# Patient Record
Sex: Male | Born: 1935 | Race: Black or African American | Hispanic: No | Marital: Married | Smoking: Never smoker
Health system: Southern US, Community
[De-identification: ages and names within clinical notes are randomized; demographics above are authoritative.]

## PROBLEM LIST (undated history)

## (undated) DIAGNOSIS — E7800 Pure hypercholesterolemia, unspecified: Secondary | ICD-10-CM

## (undated) DIAGNOSIS — I10 Essential (primary) hypertension: Secondary | ICD-10-CM

## (undated) DIAGNOSIS — K219 Gastro-esophageal reflux disease without esophagitis: Secondary | ICD-10-CM

## (undated) DIAGNOSIS — I499 Cardiac arrhythmia, unspecified: Secondary | ICD-10-CM

## (undated) DIAGNOSIS — N429 Disorder of prostate, unspecified: Secondary | ICD-10-CM

## (undated) DIAGNOSIS — M1990 Unspecified osteoarthritis, unspecified site: Secondary | ICD-10-CM

## (undated) DIAGNOSIS — Z87442 Personal history of urinary calculi: Secondary | ICD-10-CM

## (undated) DIAGNOSIS — H9190 Unspecified hearing loss, unspecified ear: Secondary | ICD-10-CM

## (undated) HISTORY — PX: EYE SURGERY: SHX253

---

## 2004-03-23 ENCOUNTER — Emergency Department: Payer: Self-pay | Admitting: General Practice

## 2007-03-30 ENCOUNTER — Ambulatory Visit: Payer: Self-pay | Admitting: Gastroenterology

## 2008-03-01 ENCOUNTER — Inpatient Hospital Stay: Payer: Self-pay | Admitting: Internal Medicine

## 2008-03-01 ENCOUNTER — Other Ambulatory Visit: Payer: Self-pay

## 2008-07-29 ENCOUNTER — Emergency Department: Payer: Self-pay | Admitting: Emergency Medicine

## 2008-08-22 ENCOUNTER — Ambulatory Visit: Payer: Self-pay | Admitting: Family Medicine

## 2008-10-07 ENCOUNTER — Ambulatory Visit: Payer: Self-pay | Admitting: Family Medicine

## 2008-10-15 ENCOUNTER — Ambulatory Visit: Payer: Self-pay | Admitting: Family Medicine

## 2009-01-28 ENCOUNTER — Ambulatory Visit: Payer: Self-pay | Admitting: Specialist

## 2011-11-01 ENCOUNTER — Emergency Department: Payer: Self-pay | Admitting: Emergency Medicine

## 2011-11-01 LAB — URINALYSIS, COMPLETE
Bacteria: NONE SEEN
Bilirubin,UR: NEGATIVE
Glucose,UR: NEGATIVE mg/dL (ref 0–75)
Ketone: NEGATIVE
Ph: 6 (ref 4.5–8.0)
Protein: NEGATIVE
Specific Gravity: 1.013 (ref 1.003–1.030)
WBC UR: <1 /HPF (ref 0–5)

## 2011-11-01 LAB — COMPREHENSIVE METABOLIC PANEL
BUN: 22 mg/dL — ABNORMAL HIGH (ref 7–18)
Bilirubin,Total: 0.7 mg/dL (ref 0.2–1.0)
Calcium, Total: 8.2 mg/dL — ABNORMAL LOW (ref 8.5–10.1)
Chloride: 101 mmol/L (ref 98–107)
EGFR (African American): >60
EGFR (Non-African Amer.): >60
SGPT (ALT): 14 U/L
Total Protein: 7 g/dL (ref 6.4–8.2)

## 2011-11-01 LAB — CBC
MCV: 91 fL (ref 80–100)
Platelet: 198 10*3/uL (ref 150–440)
RBC: 4.08 10*6/uL — ABNORMAL LOW (ref 4.40–5.90)
RDW: 13.5 % (ref 11.5–14.5)
WBC: 9.9 10*3/uL (ref 3.8–10.6)

## 2011-11-01 LAB — TROPONIN I: Troponin-I: 0.03 ng/mL

## 2011-11-01 LAB — CK TOTAL AND CKMB (NOT AT ARMC): CK-MB: 0.6 ng/mL (ref 0.5–3.6)

## 2011-11-07 LAB — CULTURE, BLOOD (SINGLE)

## 2012-05-22 DIAGNOSIS — Z8546 Personal history of malignant neoplasm of prostate: Secondary | ICD-10-CM | POA: Insufficient documentation

## 2012-05-22 DIAGNOSIS — R350 Frequency of micturition: Secondary | ICD-10-CM | POA: Insufficient documentation

## 2012-05-22 DIAGNOSIS — R351 Nocturia: Secondary | ICD-10-CM | POA: Insufficient documentation

## 2012-05-24 HISTORY — PX: HERNIA REPAIR: SHX51

## 2013-02-14 DIAGNOSIS — I10 Essential (primary) hypertension: Secondary | ICD-10-CM | POA: Insufficient documentation

## 2013-09-10 ENCOUNTER — Emergency Department: Payer: Self-pay | Admitting: Emergency Medicine

## 2013-11-06 ENCOUNTER — Ambulatory Visit: Payer: Self-pay | Admitting: Surgery

## 2013-11-06 LAB — CBC WITH DIFFERENTIAL/PLATELET
BASOS PCT: 0.6 %
Basophil #: 0 10*3/uL (ref 0.0–0.1)
EOS ABS: 0.1 10*3/uL (ref 0.0–0.7)
Eosinophil %: 3.1 %
HCT: 36 % — AB (ref 40.0–52.0)
HGB: 12 g/dL — ABNORMAL LOW (ref 13.0–18.0)
LYMPHS PCT: 26.5 %
Lymphocyte #: 1.2 10*3/uL (ref 1.0–3.6)
MCH: 30.8 pg (ref 26.0–34.0)
MCHC: 33.3 g/dL (ref 32.0–36.0)
MCV: 93 fL (ref 80–100)
Monocyte #: 0.5 x10 3/mm (ref 0.2–1.0)
Monocyte %: 11.5 %
NEUTROS ABS: 2.7 10*3/uL (ref 1.4–6.5)
NEUTROS PCT: 58.3 %
Platelet: 215 10*3/uL (ref 150–440)
RBC: 3.89 10*6/uL — ABNORMAL LOW (ref 4.40–5.90)
RDW: 14 % (ref 11.5–14.5)
WBC: 4.6 10*3/uL (ref 3.8–10.6)

## 2013-11-06 LAB — BASIC METABOLIC PANEL
ANION GAP: 4 — AB (ref 7–16)
BUN: 28 mg/dL — AB (ref 7–18)
CALCIUM: 8.5 mg/dL (ref 8.5–10.1)
CO2: 29 mmol/L (ref 21–32)
CREATININE: 0.78 mg/dL (ref 0.60–1.30)
Chloride: 104 mmol/L (ref 98–107)
EGFR (African American): >60
Glucose: 92 mg/dL (ref 65–99)
Osmolality: 279 (ref 275–301)
Potassium: 4.1 mmol/L (ref 3.5–5.1)
SODIUM: 137 mmol/L (ref 136–145)

## 2013-11-08 ENCOUNTER — Ambulatory Visit: Payer: Self-pay | Admitting: Surgery

## 2013-12-05 DIAGNOSIS — M1990 Unspecified osteoarthritis, unspecified site: Secondary | ICD-10-CM | POA: Insufficient documentation

## 2014-05-27 DIAGNOSIS — H40003 Preglaucoma, unspecified, bilateral: Secondary | ICD-10-CM | POA: Diagnosis not present

## 2014-07-29 DIAGNOSIS — J439 Emphysema, unspecified: Secondary | ICD-10-CM | POA: Diagnosis not present

## 2014-07-29 DIAGNOSIS — K219 Gastro-esophageal reflux disease without esophagitis: Secondary | ICD-10-CM | POA: Diagnosis not present

## 2014-07-29 DIAGNOSIS — E784 Other hyperlipidemia: Secondary | ICD-10-CM | POA: Diagnosis not present

## 2014-07-29 DIAGNOSIS — H6123 Impacted cerumen, bilateral: Secondary | ICD-10-CM | POA: Diagnosis not present

## 2014-07-29 DIAGNOSIS — F329 Major depressive disorder, single episode, unspecified: Secondary | ICD-10-CM | POA: Diagnosis not present

## 2014-07-29 DIAGNOSIS — I10 Essential (primary) hypertension: Secondary | ICD-10-CM | POA: Diagnosis not present

## 2014-07-29 DIAGNOSIS — H918X3 Other specified hearing loss, bilateral: Secondary | ICD-10-CM | POA: Diagnosis not present

## 2014-07-29 DIAGNOSIS — D291 Benign neoplasm of prostate: Secondary | ICD-10-CM | POA: Diagnosis not present

## 2014-08-09 DIAGNOSIS — H6120 Impacted cerumen, unspecified ear: Secondary | ICD-10-CM | POA: Diagnosis not present

## 2014-08-09 DIAGNOSIS — H903 Sensorineural hearing loss, bilateral: Secondary | ICD-10-CM | POA: Diagnosis not present

## 2014-09-14 NOTE — Op Note (Signed)
PATIENT NAME:  Eric [name], KERSTETTER MR#:  [id number] DATE OF BIRTH:  1923-11-26  DATE OF PROCEDURE:  11/08/2013  PREOPERATIVE DIAGNOSIS: Left inguinal hernia.   POSTOPERATIVE DIAGNOSIS: Left inguinal hernia.  OPERATION PERFORMED:  Robotic-assisted laparoscopic left inguinal hernia repair.  ANESTHESIA:  General.  SURGEON:  Dia Crawford, M.D.  OPERATIVE PROCEDURE:  With the patient in the supine position after placing appropriate general anesthesia, the patient's abdomen was prepped with ChloraPrep and draped with sterile towels. A small supraumbilical incision was made in the standard fashion, carried down bluntly through the subcutaneous tissue. A Veress needle was used to cannulate the peritoneal cavity. CO2 was insufflated to the appropriate pressure measurements. Approximately two and a half liters of CO2 were instilled. Veress needle was  withdrawn and an 8.5 mm robotic port was inserted into the peritoneal cavity. The abdomen was explored. No abnormalities were identified other than the large left inguinal hernia, which appeared to be an indirect defect.  There did appear to be a small direct defect on the right side. The patient, however, is not symptomatic. Two lateral ports, 8.5 mm in size, were placed under direct vision in the left upper quadrant. An 11 mm port was inserted under direct vision. The robot was brought to the table, docked, and the instruments inserted under direct vision. The peritoneum was taken down from the medial umbilical fold across the epigastric vessels laterally, exposing Cooper's ligament and the cord structures. The sac was quite big and after tedious dissection, still did not expose the end of the sac, which was deep in the scrotum. I elected to the sac was off. The sac was amputated about midportion using Bovie electrocautery. The pocket appeared to be satisfactory.  A piece of medium 3-D Bard MAX mesh was brought to the table and inserted into the preperitoneal pocket. It  was sutured in place with 3-0 Vicryl under direct vision. The peritoneum was closed over the mesh using V-Loc suture under direct vision. The repair appeared to be satisfactory. The abdomen was desufflated. All ports were removed without difficulty. All the fascial incisions were closed with 0 Vicryl. The skin was closed with 5-0 nylon. Sterile dressings were applied. The patient returned to the recovery room, having tolerated the procedure well. Sponge, instrument and needle counts were correct x 2 in the operating room.     ____________________________ Rodena Goldmann III, MD rle:dmm D: 11/08/2013 14:21:27 ET T: 11/08/2013 20:19:34 ET JOB#: [id number]  cc: Micheline Maze, MD, <Dictator> Juline Patch, MD Rodena Goldmann MD ELECTRONICALLY SIGNED 11/09/2013 18:13

## 2014-12-02 DIAGNOSIS — H34831 Tributary (branch) retinal vein occlusion, right eye: Secondary | ICD-10-CM | POA: Diagnosis not present

## 2015-03-05 DIAGNOSIS — H401132 Primary open-angle glaucoma, bilateral, moderate stage: Secondary | ICD-10-CM | POA: Diagnosis not present

## 2015-03-11 DIAGNOSIS — H401132 Primary open-angle glaucoma, bilateral, moderate stage: Secondary | ICD-10-CM | POA: Diagnosis not present

## 2015-03-25 ENCOUNTER — Other Ambulatory Visit: Payer: Self-pay | Admitting: Family Medicine

## 2015-05-26 ENCOUNTER — Other Ambulatory Visit: Payer: Self-pay | Admitting: Family Medicine

## 2015-05-27 ENCOUNTER — Other Ambulatory Visit: Payer: Self-pay

## 2015-05-27 MED ORDER — LOSARTAN POTASSIUM-HCTZ 50-12.5 MG PO TABS: 1.0000 | ORAL_TABLET | Freq: Every day | ORAL | Status: DC

## 2015-09-09 DIAGNOSIS — H401132 Primary open-angle glaucoma, bilateral, moderate stage: Secondary | ICD-10-CM | POA: Diagnosis not present

## 2015-09-16 ENCOUNTER — Emergency Department: Payer: Commercial Managed Care - HMO

## 2015-09-16 ENCOUNTER — Encounter: Payer: Self-pay | Admitting: Emergency Medicine

## 2015-09-16 ENCOUNTER — Emergency Department
Admission: EM | Admit: 2015-09-16 | Discharge: 2015-09-16 | Disposition: A | Discharge status: Home / Self Care | Payer: Commercial Managed Care - HMO | Attending: Emergency Medicine | Admitting: Emergency Medicine

## 2015-09-16 ENCOUNTER — Other Ambulatory Visit: Payer: Self-pay

## 2015-09-16 DIAGNOSIS — N200 Calculus of kidney: Secondary | ICD-10-CM | POA: Insufficient documentation

## 2015-09-16 DIAGNOSIS — R109 Unspecified abdominal pain: Secondary | ICD-10-CM

## 2015-09-16 DIAGNOSIS — R319 Hematuria, unspecified: Secondary | ICD-10-CM

## 2015-09-16 DIAGNOSIS — I10 Essential (primary) hypertension: Secondary | ICD-10-CM | POA: Diagnosis not present

## 2015-09-16 HISTORY — DX: Disorder of prostate, unspecified: N42.9

## 2015-09-16 HISTORY — DX: Essential (primary) hypertension: I10

## 2015-09-16 HISTORY — DX: Pure hypercholesterolemia, unspecified: E78.00

## 2015-09-16 LAB — URINALYSIS COMPLETE WITH MICROSCOPIC (ARMC ONLY)
BILIRUBIN URINE: NEGATIVE
Glucose, UA: NEGATIVE mg/dL
Ketones, ur: NEGATIVE mg/dL
NITRITE: NEGATIVE
PH: 6 (ref 5.0–8.0)
Protein, ur: 100 mg/dL — AB
SPECIFIC GRAVITY, URINE: 1.016 (ref 1.005–1.030)

## 2015-09-16 LAB — BASIC METABOLIC PANEL
ANION GAP: 8 (ref 5–15)
BUN: 31 mg/dL — ABNORMAL HIGH (ref 6–20)
CALCIUM: 8.5 mg/dL — AB (ref 8.9–10.3)
CO2: 28 mmol/L (ref 22–32)
Chloride: 103 mmol/L (ref 101–111)
Creatinine, Ser: 1.26 mg/dL — ABNORMAL HIGH (ref 0.61–1.24)
GFR, EST AFRICAN AMERICAN: 57 mL/min — AB (ref >60)
GFR, EST NON AFRICAN AMERICAN: 49 mL/min — AB (ref >60)
Glucose, Bld: 127 mg/dL — ABNORMAL HIGH (ref 65–99)
Potassium: 3.8 mmol/L (ref 3.5–5.1)
Sodium: 139 mmol/L (ref 135–145)

## 2015-09-16 LAB — CBC
HCT: 36 % — ABNORMAL LOW (ref 40.0–52.0)
HEMOGLOBIN: 12.2 g/dL — AB (ref 13.0–18.0)
MCH: 31 pg (ref 26.0–34.0)
MCHC: 33.8 g/dL (ref 32.0–36.0)
MCV: 91.8 fL (ref 80.0–100.0)
PLATELETS: 196 10*3/uL (ref 150–440)
RBC: 3.92 MIL/uL — AB (ref 4.40–5.90)
RDW: 13.7 % (ref 11.5–14.5)
WBC: 7.8 10*3/uL (ref 3.8–10.6)

## 2015-09-16 LAB — HEPATIC FUNCTION PANEL
ALBUMIN: 3.6 g/dL (ref 3.5–5.0)
ALT: 16 U/L — ABNORMAL LOW (ref 17–63)
AST: 19 U/L (ref 15–41)
Alkaline Phosphatase: 38 U/L (ref 38–126)
BILIRUBIN TOTAL: 0.4 mg/dL (ref 0.3–1.2)
Bilirubin, Direct: <0.1 mg/dL — ABNORMAL LOW (ref 0.1–0.5)
TOTAL PROTEIN: 6.9 g/dL (ref 6.5–8.1)

## 2015-09-16 LAB — LIPASE, BLOOD: LIPASE: 19 U/L (ref 11–51)

## 2015-09-16 MED ORDER — DEXTROSE 5 % IV SOLN
1.0000 g | Freq: Once | INTRAVENOUS | Status: AC
  Administered 2015-09-16: 1 g via INTRAVENOUS

## 2015-09-16 MED ORDER — DEXTROSE 5 % IV SOLN
INTRAVENOUS | Status: AC
  Administered 2015-09-16: 1 g via INTRAVENOUS
  Filled 2015-09-16: qty 10

## 2015-09-16 MED ORDER — SODIUM CHLORIDE 0.9 % IV BOLUS (SEPSIS)
500.0000 mL | Freq: Once | INTRAVENOUS | Status: AC
  Administered 2015-09-16: 500 mL via INTRAVENOUS

## 2015-09-16 MED ORDER — CEPHALEXIN 500 MG PO CAPS: 500.0000 mg | ORAL_CAPSULE | Freq: Four times a day (QID) | ORAL | Status: AC

## 2015-09-16 NOTE — ED Provider Notes (Addendum)
[facility] Emergency Department Provider Note  ____________________________________________   I have reviewed the triage vital signs and the nursing notes.   HISTORY  Chief Complaint Flank Pain    HPI St Eastan Kaluza is a 82 y.o. male with history of hypertension and hypercholesterol and prostate issues as well as a hernia repair were presents today complaining of what was thought initially with left flank pain which is now gone but now having some mild left lower quadrant pain. He has noted some blood in his urine. No fever no chills no nausea no vomiting no other complaints. Pain started this morning.    Past Medical History  Diagnosis Date  . Hypertension   . High cholesterol   . Prostate disorder     There are no active problems to display for this patient.   Past Surgical History  Procedure Laterality Date  . Hernia repair      Current Outpatient Rx  Name  Route  Sig  Dispense  Refill  . losartan-hydrochlorothiazide (HYZAAR) 50-12.5 MG tablet   Oral   Take 1 tablet by mouth daily.   90 tablet   1   . omeprazole (PRILOSEC) 20 MG capsule      TAKE 1 CAPSULE ONE TIME DAILY   90 capsule   0   . simvastatin (ZOCOR) 40 MG tablet      TAKE 1 TABLET AT BEDTIME   90 tablet   0   . tamsulosin (FLOMAX) 0.4 MG CAPS capsule      TAKE 1 CAPSULE ONE TIME DAILY   90 capsule   1     Allergies Review of patient's allergies indicates no known allergies.  No family history on file.  Social History Social History  Substance Use Topics  . Smoking status: Never Smoker   . Smokeless tobacco: None  . Alcohol Use: No    Review of Systems Constitutional: No fever/chills Eyes: No visual changes. ENT: No sore throat. No stiff neck no neck pain Cardiovascular: Denies chest pain. Respiratory: Denies shortness of breath. Gastrointestinal:   no vomiting.  No diarrhea.  No constipation. Genitourinary: Negative for dysuria. Musculoskeletal:  Negative lower extremity swelling Skin: Negative for rash. Neurological: Negative for headaches, focal weakness or numbness. 10-point ROS otherwise negative.  ____________________________________________   PHYSICAL EXAM:  VITAL SIGNS: ED Triage Vitals  Enc Vitals Group     BP 09/16/15 1915 168/119 mmHg     Pulse Rate 09/16/15 1915 64     Resp 09/16/15 1915 18     Temp 09/16/15 1915 98.6 F (37 C)     Temp Source 09/16/15 1915 Oral     SpO2 09/16/15 1915 99 %     Weight 09/16/15 1915 160 lb (72.576 kg)     Height 09/16/15 1915 5\' 10"  (1.778 m)     Head Cir --      Peak Flow --      Pain Score 09/16/15 1916 4     Pain Loc --      Pain Edu? --      Excl. in [place]? --     Constitutional: Alert and oriented. Well appearing and in no acute distress. Eyes: Conjunctivae are normal. PERRL. EOMI. Head: Atraumatic. Nose: No congestion/rhinnorhea. Mouth/Throat: Mucous membranes are moist.  Oropharynx non-erythematous. Neck: No stridor.   Nontender with no meningismus Cardiovascular: Normal rate, regular rhythm. Grossly normal heart sounds.  Good peripheral circulation. Respiratory: Normal respiratory effort.  No retractions. Lungs CTAB. Abdominal: Soft  and nontender. No distention. No guarding no rebound Back:  There is no focal tenderness or step off there is no midline tenderness there are no lesions noted. there is no CVA tenderness GU: Normal external noncircumcised male genitalia with no obvious lesions noted. No masses. Nontender Musculoskeletal: No lower extremity tenderness. No joint effusions, no DVT signs strong distal pulses no edema Neurologic:  Normal speech and language. No gross focal neurologic deficits are appreciated.  Skin:  Skin is warm, dry and intact. No rash noted. Psychiatric: Mood and affect are normal. Speech and behavior are normal.  ____________________________________________   LABS (all labs ordered are listed, but only abnormal results are  displayed)  Labs Reviewed  CBC - Abnormal; Notable for the following:    RBC 3.92 (*)    Hemoglobin 12.2 (*)    HCT 36.0 (*)    All other components within normal limits  BASIC METABOLIC PANEL - Abnormal; Notable for the following:    Glucose, Bld 127 (*)    BUN 31 (*)    Creatinine, Ser 1.26 (*)    Calcium 8.5 (*)    GFR calc non Af Amer 49 (*)    GFR calc Af Amer 57 (*)    All other components within normal limits  URINALYSIS COMPLETEWITH MICROSCOPIC ([facility] ONLY) - Abnormal; Notable for the following:    Color, Urine YELLOW (*)    APPearance CLOUDY (*)    Hgb urine dipstick 3+ (*)    Protein, ur 100 (*)    Leukocytes, UA 2+ (*)    Bacteria, UA RARE (*)    Squamous Epithelial / LPF 0-5 (*)    All other components within normal limits  URINE CULTURE  HEPATIC FUNCTION PANEL  LIPASE, BLOOD   ____________________________________________  EKG  I personally interpreted any EKGs ordered by me or triage  ____________________________________________  RADIOLOGY  I reviewed any imaging ordered by me or triage that were performed during my shift and, if possible, patient and/or family made aware of any abnormal findings. ____________________________________________   PROCEDURES  Procedure(s) performed: None  Critical Care performed: None  ____________________________________________   INITIAL IMPRESSION / ASSESSMENT AND PLAN / ED COURSE  Pertinent labs & imaging results that were available during my care of the patient were reviewed by me and considered in my medical decision making (see chart for details).  Patient with hematuria and a kidney stone on CT scan in the left kidney, may have already passed another kidney stone no hydronephrosis, no evidence of significant urinary retention. CBC reassuring platelet count reassuring creatinine is slightly up but not dangerously so does appear mildly dehydrated. We will give him by mouth fluids here and IV as well. Urine is  largely consistent with hematuria, this certainly could represent bladder cancer although no masses are seen on CT, could also represent urinary tract infection although again low suspicion given the significant hematuria compared to leukocytes. We will, however, obtain cultures, I will empirically treat for possible UTI since I don't see an obvious obstructing stone however, patient does have clearly a kidney stone inside the left kidney which is likely not causing any trouble but certainly indicates that he is capable of producing stones which could certainly cause all of his symptoms.  ----------------------------------------- 9:02 PM on 09/16/2015 ----------------------------------------- Patient has no pain and no complaints. We will give him Rocephin here and have him follow-up with urology. Patient is aware of the possibility as well as family are aware of the possibility that the  patient could have an obstruction from his hematuria but given the size of his prostate, I do not think it is in the patient's best interest to try to pass a Foley as he is easily making urine now with no evidence of urinary retention. Family will look out for fever or increased pain nausea vomiting or evidence of urinary retention and return to the emergency room if she feels worse in any way. He should resting very comfortably on the bed with no discomfort.  ____________________________________________   FINAL CLINICAL IMPRESSION(S) / ED DIAGNOSES  Final diagnoses:  Flank pain      This chart was dictated using voice recognition software.  Despite best efforts to proofread,  errors can occur which can change meaning.     Schuyler Amor, MD 09/16/15 2050  Schuyler Amor, MD 09/16/15 2103

## 2015-09-16 NOTE — ED Notes (Signed)
Patient ambulatory to triage with steady gait, without difficulty or distress noted; pt reports left flank pain today radiating into left lower abdomen with some dysuria; seen by Dr Ronnald Ramp and referred here for ?kidney stones; denies hx of same

## 2015-09-16 NOTE — ED Notes (Signed)
Pt taken to CT.

## 2015-09-16 NOTE — Discharge Instructions (Signed)
Right now, it seems most likely that Eric [name] passed a kidney stone. However, there is always a chance he has a urine infection and we are giving him antibiotics. If he has difficulty making urine, fever, increased pain, nausea, vomiting, weakness or if feels worse in any way return to the emergency room. Otherwise, follow up closely with urology. We have sent a culture, and if we need to change the antibiotic we will let you know.

## 2015-09-19 LAB — URINE CULTURE

## 2015-09-22 ENCOUNTER — Ambulatory Visit (INDEPENDENT_AMBULATORY_CARE_PROVIDER_SITE_OTHER): Payer: Commercial Managed Care - HMO | Admitting: Family Medicine

## 2015-09-22 ENCOUNTER — Encounter: Payer: Self-pay | Admitting: Family Medicine

## 2015-09-22 VITALS — BP 130/70 | HR 64 | Ht 70.0 in | Wt 156.0 lb

## 2015-09-22 DIAGNOSIS — R319 Hematuria, unspecified: Secondary | ICD-10-CM | POA: Diagnosis not present

## 2015-09-22 DIAGNOSIS — N200 Calculus of kidney: Secondary | ICD-10-CM

## 2015-09-22 LAB — POCT URINALYSIS DIPSTICK
Bilirubin, UA: NEGATIVE
GLUCOSE UA: NEGATIVE
KETONES UA: NEGATIVE
LEUKOCYTES UA: NEGATIVE
Nitrite, UA: NEGATIVE
PROTEIN UA: NEGATIVE
Spec Grav, UA: 1.02
Urobilinogen, UA: 0.2
pH, UA: 6

## 2015-09-22 NOTE — Progress Notes (Signed)
Name: Eric [name]   MRN: [id number]    DOB: Jan 26, 1924   Date:09/22/2015       Progress Note  Subjective  Chief Complaint  Chief Complaint  Patient presents with  . Follow-up    ED 09/16/15- passed kidney stone/ has 3 more days on cephalexin- feeling better    HPI  No problem-specific assessment & plan notes found for this encounter.   Past Medical History  Diagnosis Date  . Hypertension   . High cholesterol   . Prostate disorder     Past Surgical History  Procedure Laterality Date  . Hernia repair      No family history on file.  Social History   Social History  . Marital Status: Married    Spouse Name: N/A  . Number of Children: N/A  . Years of Education: N/A   Occupational History  . Not on file.   Social History Main Topics  . Smoking status: Never Smoker   . Smokeless tobacco: Not on file  . Alcohol Use: No  . Drug Use: Not on file  . Sexual Activity: Not on file   Other Topics Concern  . Not on file   Social History Narrative    No Known Allergies   Review of Systems  Constitutional: Negative for fever, chills, weight loss and malaise/fatigue.  HENT: Negative for ear discharge, ear pain and sore throat.   Eyes: Negative for blurred vision.  Respiratory: Negative for cough, sputum production, shortness of breath and wheezing.   Cardiovascular: Negative for chest pain, palpitations and leg swelling.  Gastrointestinal: Negative for heartburn, nausea, abdominal pain, diarrhea, constipation, blood in stool and melena.  Genitourinary: Negative for dysuria, urgency, frequency and hematuria.  Musculoskeletal: Negative for myalgias, back pain, joint pain and neck pain.  Skin: Negative for rash.  Neurological: Negative for dizziness, tingling, sensory change, focal weakness and headaches.  Endo/Heme/Allergies: Negative for environmental allergies and polydipsia. Does not bruise/bleed easily.  Psychiatric/Behavioral: Negative for depression and  suicidal ideas. The patient is not nervous/anxious and does not have insomnia.      Objective  Filed Vitals:   09/22/15 1058  BP: 130/70  Pulse: 64  Height: 5\' 10"  (1.778 m)  Weight: 156 lb (70.761 kg)    Physical Exam  Constitutional: He is oriented to person, place, and time and well-developed, well-nourished, and in no distress.  HENT:  Head: Normocephalic.  Right Ear: External ear normal.  Left Ear: External ear normal.  Nose: Nose normal.  Mouth/Throat: Oropharynx is clear and moist.  Eyes: Conjunctivae and EOM are normal. Pupils are equal, round, and reactive to light. Right eye exhibits no discharge. Left eye exhibits no discharge. No scleral icterus.  Neck: Normal range of motion. Neck supple. No JVD present. No tracheal deviation present. No thyromegaly present.  Cardiovascular: Normal rate, regular rhythm, normal heart sounds and intact distal pulses.  Exam reveals no gallop and no friction rub.   No murmur heard. Pulmonary/Chest: Breath sounds normal. No respiratory distress. He has no wheezes. He has no rales.  Abdominal: Soft. Bowel sounds are normal. He exhibits no mass. There is no hepatosplenomegaly. There is no tenderness. There is no rebound, no guarding and no CVA tenderness.  Musculoskeletal: Normal range of motion. He exhibits no edema or tenderness.  Lymphadenopathy:    He has no cervical adenopathy.  Neurological: He is alert and oriented to person, place, and time. He has normal sensation, normal strength, normal reflexes and intact cranial nerves. No  cranial nerve deficit.  Skin: Skin is warm. No rash noted.  Psychiatric: Mood and affect normal.  Nursing note and vitals reviewed.     Assessment & Plan  Problem List Items Addressed This Visit    None    Visit Diagnoses    Kidney stone on left side    -  Primary    Relevant Orders    POCT urinalysis dipstick (Completed)    Hematuria        Relevant Orders    POCT urinalysis dipstick (Completed)          Dr. Otilio Miu [facility]  09/22/2015

## 2015-10-06 ENCOUNTER — Other Ambulatory Visit: Payer: Self-pay | Admitting: Family Medicine

## 2015-10-22 ENCOUNTER — Ambulatory Visit (INDEPENDENT_AMBULATORY_CARE_PROVIDER_SITE_OTHER): Payer: Commercial Managed Care - HMO | Admitting: Family Medicine

## 2015-10-22 ENCOUNTER — Encounter: Payer: Self-pay | Admitting: Family Medicine

## 2015-10-22 VITALS — BP 120/70 | HR 68 | Ht 70.0 in | Wt 157.0 lb

## 2015-10-22 DIAGNOSIS — E782 Mixed hyperlipidemia: Secondary | ICD-10-CM | POA: Insufficient documentation

## 2015-10-22 DIAGNOSIS — E785 Hyperlipidemia, unspecified: Secondary | ICD-10-CM | POA: Diagnosis not present

## 2015-10-22 DIAGNOSIS — N400 Enlarged prostate without lower urinary tract symptoms: Secondary | ICD-10-CM | POA: Insufficient documentation

## 2015-10-22 DIAGNOSIS — N3001 Acute cystitis with hematuria: Secondary | ICD-10-CM | POA: Diagnosis not present

## 2015-10-22 DIAGNOSIS — I10 Essential (primary) hypertension: Secondary | ICD-10-CM | POA: Insufficient documentation

## 2015-10-22 DIAGNOSIS — K219 Gastro-esophageal reflux disease without esophagitis: Secondary | ICD-10-CM

## 2015-10-22 LAB — POCT URINALYSIS DIPSTICK
Bilirubin, UA: NEGATIVE
GLUCOSE UA: NEGATIVE
Ketones, UA: NEGATIVE
NITRITE UA: NEGATIVE
Protein, UA: NEGATIVE
Spec Grav, UA: 1.015
UROBILINOGEN UA: 0.2
pH, UA: 5

## 2015-10-22 LAB — HEMOCCULT GUIAC POC 1CARD (OFFICE): FECAL OCCULT BLD: NEGATIVE

## 2015-10-22 MED ORDER — SULFAMETHOXAZOLE-TRIMETHOPRIM 800-160 MG PO TABS: 1.0000 | ORAL_TABLET | Freq: Two times a day (BID) | ORAL | Status: DC

## 2015-10-22 MED ORDER — TAMSULOSIN HCL 0.4 MG PO CAPS: 0.4000 mg | ORAL_CAPSULE | Freq: Every day | ORAL | Status: DC

## 2015-10-22 MED ORDER — SIMVASTATIN 40 MG PO TABS: 40.0000 mg | ORAL_TABLET | Freq: Every day | ORAL | Status: DC

## 2015-10-22 MED ORDER — LOSARTAN POTASSIUM-HCTZ 50-12.5 MG PO TABS: 1.0000 | ORAL_TABLET | Freq: Every day | ORAL | Status: DC

## 2015-10-22 MED ORDER — OMEPRAZOLE 20 MG PO CPDR: 20.0000 mg | DELAYED_RELEASE_CAPSULE | Freq: Every day | ORAL | Status: DC

## 2015-10-22 NOTE — Progress Notes (Signed)
Name: Eric [name]   MRN: [id number]    DOB: 03-17-1924   Date:10/22/2015       Progress Note  Subjective  Chief Complaint  Chief Complaint  Patient presents with  . Hypertension  . Hyperlipidemia  . Gastroesophageal Reflux  . Benign Prostatic Hypertrophy    Hypertension This is a chronic problem. The current episode started more than 1 year ago. The problem is controlled. Pertinent negatives include no anxiety, blurred vision, chest pain, headaches, malaise/fatigue, neck pain, orthopnea, palpitations, peripheral edema, PND, shortness of breath or sweats. There are no associated agents to hypertension. There are no known risk factors for coronary artery disease. Past treatments include ACE inhibitors and diuretics. The current treatment provides moderate improvement. There are no compliance problems.  There is no history of angina, kidney disease, CAD/MI, CVA, heart failure, left ventricular hypertrophy, PVD, renovascular disease or retinopathy. There is no history of chronic renal disease or a hypertension causing med.  Hyperlipidemia This is a chronic problem. The current episode started more than 1 year ago. Recent lipid tests were reviewed and are normal. He has no history of chronic renal disease. Pertinent negatives include no chest pain, focal weakness, myalgias or shortness of breath. Current antihyperlipidemic treatment includes statins. The current treatment provides moderate improvement of lipids. There are no compliance problems.   Gastroesophageal Reflux He complains of wheezing. He reports no abdominal pain, no chest pain, no choking, no coughing, no dysphagia, no heartburn, no nausea or no sore throat. This is a chronic problem. The current episode started more than 1 year ago. The problem occurs occasionally. The problem has been gradually improving. Pertinent negatives include no melena or weight loss. He has tried a PPI for the symptoms. The treatment provided mild relief.   Benign Prostatic Hypertrophy This is a chronic problem. The current episode started more than 1 year ago. The problem has been waxing and waning since onset. Irritative symptoms do not include frequency or urgency. Pertinent negatives include no chills, dysuria, hematuria, hesitancy or nausea.    No problem-specific assessment & plan notes found for this encounter.   Past Medical History  Diagnosis Date  . Hypertension   . High cholesterol   . Prostate disorder     Past Surgical History  Procedure Laterality Date  . Hernia repair      History reviewed. No pertinent family history.  Social History   Social History  . Marital Status: Married    Spouse Name: N/A  . Number of Children: N/A  . Years of Education: N/A   Occupational History  . Not on file.   Social History Main Topics  . Smoking status: Never Smoker   . Smokeless tobacco: Not on file  . Alcohol Use: No  . Drug Use: Not on file  . Sexual Activity: Not on file   Other Topics Concern  . Not on file   Social History Narrative    No Known Allergies   Review of Systems  Constitutional: Negative for fever, chills, weight loss and malaise/fatigue.  HENT: Negative for ear discharge, ear pain and sore throat.   Eyes: Negative for blurred vision.  Respiratory: Positive for wheezing. Negative for cough, sputum production, choking and shortness of breath.   Cardiovascular: Negative for chest pain, palpitations, orthopnea, leg swelling and PND.  Gastrointestinal: Negative for heartburn, dysphagia, nausea, abdominal pain, diarrhea, constipation, blood in stool and melena.  Genitourinary: Negative for dysuria, hesitancy, urgency, frequency and hematuria.  Musculoskeletal: Negative for myalgias,  back pain, joint pain and neck pain.  Skin: Negative for rash.  Neurological: Negative for dizziness, tingling, sensory change, focal weakness and headaches.  Endo/Heme/Allergies: Negative for environmental allergies and  polydipsia. Does not bruise/bleed easily.  Psychiatric/Behavioral: Negative for depression and suicidal ideas. The patient is not nervous/anxious and does not have insomnia.      Objective  Filed Vitals:   10/22/15 0900  BP: 120/70  Pulse: 68  Height: 5\' 10"  (1.778 m)  Weight: 157 lb (71.215 kg)    Physical Exam  Constitutional: He is oriented to person, place, and time and well-developed, well-nourished, and in no distress.  HENT:  Head: Normocephalic.  Right Ear: External ear normal.  Left Ear: External ear normal.  Nose: Nose normal.  Mouth/Throat: Oropharynx is clear and moist.  Eyes: Conjunctivae and EOM are normal. Pupils are equal, round, and reactive to light. Right eye exhibits no discharge. Left eye exhibits no discharge. No scleral icterus.  Neck: Normal range of motion. Neck supple. No JVD present. No tracheal deviation present. No thyromegaly present.  Cardiovascular: Normal rate, regular rhythm, normal heart sounds and intact distal pulses.  Exam reveals no gallop and no friction rub.   No murmur heard. Pulmonary/Chest: Breath sounds normal. No respiratory distress. He has no wheezes. He has no rales.  Abdominal: Soft. Bowel sounds are normal. He exhibits no mass. There is no hepatosplenomegaly. There is no tenderness. There is no rebound, no guarding and no CVA tenderness.  Genitourinary: Rectum normal and prostate normal. Guaiac negative stool.  Musculoskeletal: Normal range of motion. He exhibits no edema or tenderness.  Lymphadenopathy:    He has no cervical adenopathy.  Neurological: He is alert and oriented to person, place, and time. He has normal sensation, normal strength, normal reflexes and intact cranial nerves. No cranial nerve deficit.  Skin: Skin is warm. No rash noted.  Psychiatric: Mood and affect normal.  Nursing note and vitals reviewed.     Assessment & Plan  Problem List Items Addressed This Visit      Cardiovascular and Mediastinum    Essential hypertension - Primary   Relevant Medications   losartan-hydrochlorothiazide (HYZAAR) 50-12.5 MG tablet   simvastatin (ZOCOR) 40 MG tablet   Other Relevant Orders   Renal Function Panel     Genitourinary   BPH (benign prostatic hypertrophy)   Relevant Medications   tamsulosin (FLOMAX) 0.4 MG CAPS capsule   Other Relevant Orders   PSA     Other   Hyperlipidemia   Relevant Medications   losartan-hydrochlorothiazide (HYZAAR) 50-12.5 MG tablet   simvastatin (ZOCOR) 40 MG tablet   Other Relevant Orders   Lipid Profile    Other Visit Diagnoses    Gastroesophageal reflux disease, esophagitis presence not specified        Relevant Medications    omeprazole (PRILOSEC) 20 MG capsule    Other Relevant Orders    POCT Occult Blood Stool (Completed)    Acute cystitis with hematuria        Relevant Medications    sulfamethoxazole-trimethoprim (BACTRIM DS,SEPTRA DS) 800-160 MG tablet    Other Relevant Orders    POCT Urinalysis Dipstick (Completed)         Dr. Deanna Jones [facility]  10/22/2015

## 2015-10-23 LAB — RENAL FUNCTION PANEL
Albumin: 3.7 g/dL (ref 3.2–4.6)
BUN / CREAT RATIO: 25 — AB (ref 10–24)
BUN: 23 mg/dL (ref 10–36)
CALCIUM: 8.4 mg/dL — AB (ref 8.6–10.2)
CO2: 25 mmol/L (ref 18–29)
Chloride: 100 mmol/L (ref 96–106)
Creatinine, Ser: 0.92 mg/dL (ref 0.76–1.27)
GFR calc Af Amer: 84 mL/min/{1.73_m2} (ref >59)
GFR calc non Af Amer: 73 mL/min/{1.73_m2} (ref >59)
GLUCOSE: 76 mg/dL (ref 65–99)
POTASSIUM: 3.5 mmol/L (ref 3.5–5.2)
Phosphorus: 2.7 mg/dL (ref 2.5–4.5)
SODIUM: 141 mmol/L (ref 134–144)

## 2015-10-23 LAB — LIPID PANEL
CHOL/HDL RATIO: 2.3 ratio (ref 0.0–5.0)
CHOLESTEROL TOTAL: 187 mg/dL (ref 100–199)
HDL: 82 mg/dL (ref >39)
LDL CALC: 93 mg/dL (ref 0–99)
Triglycerides: 60 mg/dL (ref 0–149)
VLDL CHOLESTEROL CAL: 12 mg/dL (ref 5–40)

## 2015-10-23 LAB — PSA: PROSTATE SPECIFIC AG, SERUM: 1.7 ng/mL (ref 0.0–4.0)

## 2015-11-07 ENCOUNTER — Other Ambulatory Visit: Payer: Self-pay

## 2016-03-24 ENCOUNTER — Encounter: Payer: Self-pay | Admitting: Family Medicine

## 2016-03-24 ENCOUNTER — Ambulatory Visit (INDEPENDENT_AMBULATORY_CARE_PROVIDER_SITE_OTHER): Payer: Commercial Managed Care - HMO | Admitting: Family Medicine

## 2016-03-24 VITALS — BP 130/62 | HR 64 | Ht 70.0 in | Wt 152.0 lb

## 2016-03-24 DIAGNOSIS — H6123 Impacted cerumen, bilateral: Secondary | ICD-10-CM

## 2016-03-24 DIAGNOSIS — H918X3 Other specified hearing loss, bilateral: Secondary | ICD-10-CM

## 2016-03-24 MED ORDER — CARBAMIDE PEROXIDE 6.5 % OT SOLN: 5.0000 [drp] | Freq: Two times a day (BID) | OTIC | 0 refills | Status: DC

## 2016-03-24 NOTE — Progress Notes (Signed)
Name: Eric [name]   MRN: [id number]    DOB: April 15, 1924   Date:03/24/2016       Progress Note  Subjective  Chief Complaint  Chief Complaint  Patient presents with  . Referral    needs referral to ENT- went for hearing aide check and was told to see ENT for ear washing    Otalgia   There is pain in both ears. This is a chronic problem. The problem has been gradually worsening. There has been no fever. The patient is experiencing no pain. Pertinent negatives include no abdominal pain, coughing, diarrhea, ear discharge, headaches, neck pain, rash or sore throat. He has tried nothing for the symptoms. The treatment provided mild relief. His past medical history is significant for hearing loss. There is no history of a chronic ear infection.    No problem-specific Assessment & Plan notes found for this encounter.   Past Medical History:  Diagnosis Date  . High cholesterol   . Hypertension   . Prostate disorder     Past Surgical History:  Procedure Laterality Date  . HERNIA REPAIR      History reviewed. No pertinent family history.  Social History   Social History  . Marital status: Married    Spouse name: N/A  . Number of children: N/A  . Years of education: N/A   Occupational History  . Not on file.   Social History Main Topics  . Smoking status: Never Smoker  . Smokeless tobacco: Not on file  . Alcohol use No  . Drug use: Unknown  . Sexual activity: Not on file   Other Topics Concern  . Not on file   Social History Narrative  . No narrative on file    No Known Allergies   Review of Systems  Constitutional: Negative for chills, fever, malaise/fatigue and weight loss.  HENT: Positive for ear pain. Negative for ear discharge and sore throat.   Eyes: Negative for blurred vision.  Respiratory: Negative for cough, sputum production, shortness of breath and wheezing.   Cardiovascular: Negative for chest pain, palpitations and leg swelling.  Gastrointestinal:  Negative for abdominal pain, blood in stool, constipation, diarrhea, heartburn, melena and nausea.  Genitourinary: Negative for dysuria, frequency, hematuria and urgency.  Musculoskeletal: Negative for back pain, joint pain, myalgias and neck pain.  Skin: Negative for rash.  Neurological: Negative for dizziness, tingling, sensory change, focal weakness and headaches.  Endo/Heme/Allergies: Negative for environmental allergies and polydipsia. Does not bruise/bleed easily.  Psychiatric/Behavioral: Negative for depression and suicidal ideas. The patient is not nervous/anxious and does not have insomnia.      Objective  Vitals:   03/24/16 1009  BP: 130/62  Pulse: 64  Weight: 152 lb (68.9 kg)  Height: 5\' 10"  (1.778 m)    Physical Exam  Constitutional: He is oriented to person, place, and time and well-developed, well-nourished, and in no distress.  HENT:  Head: Normocephalic.  Right Ear: External ear normal.  Left Ear: External ear normal.  Nose: Nose normal.  Mouth/Throat: Oropharynx is clear and moist.  Eyes: Conjunctivae and EOM are normal. Pupils are equal, round, and reactive to light. Right eye exhibits no discharge. Left eye exhibits no discharge. No scleral icterus.  Neck: Normal range of motion. Neck supple. No JVD present. No tracheal deviation present. No thyromegaly present.  Cardiovascular: Normal rate, regular rhythm, normal heart sounds and intact distal pulses.  Exam reveals no gallop and no friction rub.   No murmur heard. Pulmonary/Chest: Breath  sounds normal. No respiratory distress. He has no wheezes. He has no rales.  Abdominal: Soft. Bowel sounds are normal. He exhibits no mass. There is no hepatosplenomegaly. There is no tenderness. There is no rebound, no guarding and no CVA tenderness.  Musculoskeletal: Normal range of motion. He exhibits no edema or tenderness.  Lymphadenopathy:    He has no cervical adenopathy.  Neurological: He is alert and oriented to  person, place, and time. He has normal sensation, normal strength, normal reflexes and intact cranial nerves. No cranial nerve deficit.  Skin: Skin is warm. No rash noted.  Psychiatric: Mood and affect normal.  Nursing note and vitals reviewed.     Assessment & Plan  Problem List Items Addressed This Visit    None    Visit Diagnoses    Other specified hearing loss of both ears    -  Primary   Bilateral impacted cerumen       Relevant Medications   carbamide peroxide (DEBROX) 6.5 % otic solution   Other Relevant Orders   Ambulatory referral to ENT        Dr. Otilio Miu [facility]  03/24/16

## 2016-03-26 ENCOUNTER — Other Ambulatory Visit: Payer: Self-pay

## 2016-03-26 ENCOUNTER — Telehealth: Payer: Self-pay

## 2016-03-26 NOTE — Telephone Encounter (Signed)
Debra/ daughter had called to ask 1) How long should pt use ear drops? 2) When is the appt to ear nose and throat?  I called [phone] and it said "The number you have dialed is no longer in service." The second number.. [phone] went to pt. I then tried to call Fred/ son- spoke to him and told him that pt needed to use ear drops qday until he sees ENT. He is to see them on 04/21/16 @ 3:00 in [place] which was given to [place] on Wednesday and told again today.

## 2016-03-29 ENCOUNTER — Telehealth: Payer: Self-pay

## 2016-04-05 NOTE — Telephone Encounter (Signed)
Spoke to son concerning appt

## 2016-04-21 DIAGNOSIS — H905 Unspecified sensorineural hearing loss: Secondary | ICD-10-CM | POA: Diagnosis not present

## 2016-04-21 DIAGNOSIS — H6123 Impacted cerumen, bilateral: Secondary | ICD-10-CM | POA: Diagnosis not present

## 2016-05-04 ENCOUNTER — Other Ambulatory Visit: Payer: Self-pay

## 2016-05-27 ENCOUNTER — Ambulatory Visit: Payer: Commercial Managed Care - HMO | Admitting: Family Medicine

## 2016-05-31 ENCOUNTER — Encounter: Payer: Self-pay | Admitting: Family Medicine

## 2016-05-31 ENCOUNTER — Ambulatory Visit (INDEPENDENT_AMBULATORY_CARE_PROVIDER_SITE_OTHER): Payer: Commercial Managed Care - HMO | Admitting: Family Medicine

## 2016-05-31 VITALS — BP 130/70 | HR 64 | Ht 70.0 in | Wt 159.0 lb

## 2016-05-31 DIAGNOSIS — N401 Enlarged prostate with lower urinary tract symptoms: Secondary | ICD-10-CM | POA: Diagnosis not present

## 2016-05-31 DIAGNOSIS — I10 Essential (primary) hypertension: Secondary | ICD-10-CM

## 2016-05-31 DIAGNOSIS — E785 Hyperlipidemia, unspecified: Secondary | ICD-10-CM

## 2016-05-31 DIAGNOSIS — L259 Unspecified contact dermatitis, unspecified cause: Secondary | ICD-10-CM | POA: Diagnosis not present

## 2016-05-31 DIAGNOSIS — K219 Gastro-esophageal reflux disease without esophagitis: Secondary | ICD-10-CM | POA: Diagnosis not present

## 2016-05-31 LAB — POCT URINALYSIS DIPSTICK
Bilirubin, UA: NEGATIVE
Glucose, UA: NEGATIVE
KETONES UA: NEGATIVE
LEUKOCYTES UA: NEGATIVE
NITRITE UA: NEGATIVE
PH UA: 5
Spec Grav, UA: 1.01
UROBILINOGEN UA: 0.2

## 2016-05-31 MED ORDER — OMEPRAZOLE 20 MG PO CPDR: 20.0000 mg | DELAYED_RELEASE_CAPSULE | Freq: Every day | ORAL | 1 refills | Status: DC

## 2016-05-31 MED ORDER — LOSARTAN POTASSIUM-HCTZ 50-12.5 MG PO TABS: 1.0000 | ORAL_TABLET | Freq: Every day | ORAL | 1 refills | Status: DC

## 2016-05-31 MED ORDER — ASPIRIN 81 MG PO TABS: 81.0000 mg | ORAL_TABLET | Freq: Every day | ORAL | 11 refills | Status: DC

## 2016-05-31 MED ORDER — SIMVASTATIN 40 MG PO TABS: 40.0000 mg | ORAL_TABLET | Freq: Every day | ORAL | 1 refills | Status: DC

## 2016-05-31 MED ORDER — TAMSULOSIN HCL 0.4 MG PO CAPS: 0.4000 mg | ORAL_CAPSULE | Freq: Every day | ORAL | 1 refills | Status: DC

## 2016-05-31 NOTE — Progress Notes (Signed)
Name: Eric [name]   MRN: [id number]    DOB: 17-Sep-1923   Date:05/31/2016       Progress Note  Subjective  Chief Complaint  Chief Complaint  Patient presents with  . Gastroesophageal Reflux  . Hypertension  . Hyperlipidemia  . Benign Prostatic Hypertrophy    Gastroesophageal Reflux  He reports no abdominal pain, no belching, no chest pain, no choking, no coughing, no dysphagia, no early satiety, no globus sensation, no heartburn, no hoarse voice, no nausea, no sore throat, no stridor, no tooth decay, no water brash or no wheezing. This is a chronic problem. The current episode started more than 1 year ago. The problem occurs occasionally. The problem has been gradually improving. The symptoms are aggravated by certain foods. Pertinent negatives include no melena or weight loss. Risk factors include NSAIDs. He has tried a PPI for the symptoms. The treatment provided mild relief.  Hypertension  This is a chronic problem. The current episode started more than 1 year ago. The problem has been gradually improving since onset. The problem is controlled. Pertinent negatives include no anxiety, blurred vision, chest pain, headaches, malaise/fatigue, neck pain, orthopnea, palpitations, peripheral edema, PND, shortness of breath or sweats. There are no associated agents to hypertension. Risk factors for coronary artery disease include dyslipidemia. Past treatments include angiotensin blockers and diuretics. The current treatment provides moderate improvement. There are no compliance problems.  There is no history of angina, kidney disease, CAD/MI, CVA, heart failure, left ventricular hypertrophy, PVD, renovascular disease or retinopathy. There is no history of chronic renal disease or a hypertension causing med.  Hyperlipidemia  This is a chronic problem. The current episode started more than 1 year ago. The problem is controlled. Recent lipid tests were reviewed and are normal. He has no history of  chronic renal disease. Pertinent negatives include no chest pain, focal weakness, myalgias or shortness of breath. Current antihyperlipidemic treatment includes statins. The current treatment provides moderate improvement of lipids. Compliance problems include medication side effects.  Risk factors for coronary artery disease include dyslipidemia.  Benign Prostatic Hypertrophy  This is a chronic problem. The current episode started more than 1 year ago. The problem has been waxing and waning since onset. Irritative symptoms include nocturia. Irritative symptoms do not include frequency or urgency. Obstructive symptoms do not include dribbling, incomplete emptying, an intermittent stream, a slower stream or a weak stream. Pertinent negatives include no chills, dysuria, hematuria, hesitancy or nausea. The treatment provided mild relief.    No problem-specific Assessment & Plan notes found for this encounter.   Past Medical History:  Diagnosis Date  . High cholesterol   . Hypertension   . Prostate disorder     Past Surgical History:  Procedure Laterality Date  . HERNIA REPAIR      No family history on file.  Social History   Social History  . Marital status: Married    Spouse name: N/A  . Number of children: N/A  . Years of education: N/A   Occupational History  . Not on file.   Social History Main Topics  . Smoking status: Never Smoker  . Smokeless tobacco: Not on file  . Alcohol use No  . Drug use: Unknown  . Sexual activity: Not on file   Other Topics Concern  . Not on file   Social History Narrative  . No narrative on file    No Known Allergies   Review of Systems  Constitutional: Negative for chills, fever, malaise/fatigue  and weight loss.  HENT: Negative for ear discharge, ear pain, hoarse voice and sore throat.   Eyes: Negative for blurred vision.  Respiratory: Negative for cough, sputum production, choking, shortness of breath and wheezing.    Cardiovascular: Negative for chest pain, palpitations, orthopnea, leg swelling and PND.  Gastrointestinal: Negative for abdominal pain, blood in stool, constipation, diarrhea, dysphagia, heartburn, melena and nausea.  Genitourinary: Positive for nocturia. Negative for dysuria, frequency, hematuria, hesitancy, incomplete emptying and urgency.  Musculoskeletal: Negative for back pain, joint pain, myalgias and neck pain.  Skin: Negative for rash.  Neurological: Negative for dizziness, tingling, sensory change, focal weakness and headaches.  Endo/Heme/Allergies: Negative for environmental allergies and polydipsia. Does not bruise/bleed easily.  Psychiatric/Behavioral: Negative for depression and suicidal ideas. The patient is not nervous/anxious and does not have insomnia.      Objective  Vitals:   05/31/16 0853  BP: 130/70  Pulse: 64  SpO2: 99%  Weight: 159 lb (72.1 kg)  Height: 5\' 10"  (1.778 m)    Physical Exam  Constitutional: He is oriented to person, place, and time and well-developed, well-nourished, and in no distress.  HENT:  Head: Normocephalic.  Right Ear: External ear normal.  Left Ear: External ear normal.  Nose: Nose normal.  Mouth/Throat: Oropharynx is clear and moist.  Eyes: Conjunctivae and EOM are normal. Pupils are equal, round, and reactive to light. Right eye exhibits no discharge. Left eye exhibits no discharge. No scleral icterus.  Neck: Normal range of motion. Neck supple. No JVD present. No tracheal deviation present. No thyromegaly present.  Cardiovascular: Normal rate, regular rhythm, normal heart sounds and intact distal pulses.  Exam reveals no gallop and no friction rub.   No murmur heard. Pulmonary/Chest: Breath sounds normal. No respiratory distress. He has no wheezes. He has no rales.  Abdominal: Soft. Bowel sounds are normal. He exhibits no mass. There is no hepatosplenomegaly. There is no tenderness. There is no rebound, no guarding and no CVA  tenderness.  Musculoskeletal: Normal range of motion. He exhibits no edema or tenderness.  Lymphadenopathy:    He has no cervical adenopathy.  Neurological: He is alert and oriented to person, place, and time. He has normal sensation, normal strength, normal reflexes and intact cranial nerves. No cranial nerve deficit.  Skin: Skin is warm. No rash noted.  Psychiatric: Mood and affect normal.  Nursing note and vitals reviewed.     Assessment & Plan  Problem List Items Addressed This Visit      Cardiovascular and Mediastinum   Essential hypertension - Primary   Relevant Medications   losartan-hydrochlorothiazide (HYZAAR) 50-12.5 MG tablet   simvastatin (ZOCOR) 40 MG tablet   aspirin 81 MG tablet   Other Relevant Orders   POCT Urinalysis Dipstick (Completed)     Genitourinary   Benign prostatic hyperplasia   Relevant Medications   tamsulosin (FLOMAX) 0.4 MG CAPS capsule     Other   Hyperlipidemia   Relevant Medications   losartan-hydrochlorothiazide (HYZAAR) 50-12.5 MG tablet   simvastatin (ZOCOR) 40 MG tablet   aspirin 81 MG tablet    Other Visit Diagnoses    Contact dermatitis, unspecified contact dermatitis type, unspecified trigger       Gastroesophageal reflux disease, esophagitis presence not specified       Relevant Medications   omeprazole (PRILOSEC) 20 MG capsule        Dr. Aolani Piggott [facility]  05/31/16

## 2016-06-18 ENCOUNTER — Emergency Department
Admission: EM | Admit: 2016-06-18 | Discharge: 2016-06-18 | Disposition: A | Discharge status: Home / Self Care | Payer: Medicare HMO | Attending: Student in an Organized Health Care Education/Training Program | Admitting: Student in an Organized Health Care Education/Training Program

## 2016-06-18 ENCOUNTER — Encounter: Payer: Self-pay | Admitting: Emergency Medicine

## 2016-06-18 ENCOUNTER — Other Ambulatory Visit: Payer: Self-pay

## 2016-06-18 ENCOUNTER — Emergency Department: Payer: Medicare HMO

## 2016-06-18 DIAGNOSIS — Z7982 Long term (current) use of aspirin: Secondary | ICD-10-CM | POA: Insufficient documentation

## 2016-06-18 DIAGNOSIS — R300 Dysuria: Secondary | ICD-10-CM | POA: Diagnosis not present

## 2016-06-18 DIAGNOSIS — R3911 Hesitancy of micturition: Secondary | ICD-10-CM

## 2016-06-18 DIAGNOSIS — N401 Enlarged prostate with lower urinary tract symptoms: Secondary | ICD-10-CM

## 2016-06-18 DIAGNOSIS — N201 Calculus of ureter: Secondary | ICD-10-CM | POA: Diagnosis not present

## 2016-06-18 DIAGNOSIS — I10 Essential (primary) hypertension: Secondary | ICD-10-CM | POA: Insufficient documentation

## 2016-06-18 DIAGNOSIS — Z79899 Other long term (current) drug therapy: Secondary | ICD-10-CM | POA: Insufficient documentation

## 2016-06-18 LAB — CBC WITH DIFFERENTIAL/PLATELET
BASOS ABS: 0.1 10*3/uL (ref 0–0.1)
BASOS PCT: 1 %
Eosinophils Absolute: 0 10*3/uL (ref 0–0.7)
Eosinophils Relative: 0 %
HEMATOCRIT: 37.8 % — AB (ref 40.0–52.0)
HEMOGLOBIN: 12.8 g/dL — AB (ref 13.0–18.0)
LYMPHS PCT: 9 %
Lymphs Abs: 1.2 10*3/uL (ref 1.0–3.6)
MCH: 29.7 pg (ref 26.0–34.0)
MCHC: 33.8 g/dL (ref 32.0–36.0)
MCV: 87.7 fL (ref 80.0–100.0)
Monocytes Absolute: 1.5 10*3/uL — ABNORMAL HIGH (ref 0.2–1.0)
Monocytes Relative: 11 %
NEUTROS ABS: 10.7 10*3/uL — AB (ref 1.4–6.5)
NEUTROS PCT: 79 %
Platelets: 218 10*3/uL (ref 150–440)
RBC: 4.31 MIL/uL — ABNORMAL LOW (ref 4.40–5.90)
RDW: 13.2 % (ref 11.5–14.5)
WBC: 13.5 10*3/uL — ABNORMAL HIGH (ref 3.8–10.6)

## 2016-06-18 LAB — URINALYSIS, COMPLETE (UACMP) WITH MICROSCOPIC
Bilirubin Urine: NEGATIVE
Glucose, UA: NEGATIVE mg/dL
KETONES UR: NEGATIVE mg/dL
NITRITE: NEGATIVE
PH: 5 (ref 5.0–8.0)
PROTEIN: 100 mg/dL — AB
Specific Gravity, Urine: 1.016 (ref 1.005–1.030)

## 2016-06-18 LAB — BASIC METABOLIC PANEL
ANION GAP: 8 (ref 5–15)
BUN: 23 mg/dL — ABNORMAL HIGH (ref 6–20)
CHLORIDE: 99 mmol/L — AB (ref 101–111)
CO2: 29 mmol/L (ref 22–32)
Calcium: 8.2 mg/dL — ABNORMAL LOW (ref 8.9–10.3)
Creatinine, Ser: 1.26 mg/dL — ABNORMAL HIGH (ref 0.61–1.24)
GFR calc non Af Amer: 48 mL/min — ABNORMAL LOW (ref >60)
GFR, EST AFRICAN AMERICAN: 56 mL/min — AB (ref >60)
GLUCOSE: 112 mg/dL — AB (ref 65–99)
POTASSIUM: 3.3 mmol/L — AB (ref 3.5–5.1)
Sodium: 136 mmol/L (ref 135–145)

## 2016-06-18 MED ORDER — LEVOFLOXACIN 500 MG PO TABS
500.0000 mg | ORAL_TABLET | Freq: Once | ORAL | Status: AC
  Administered 2016-06-18: 500 mg via ORAL
  Filled 2016-06-18: qty 1

## 2016-06-18 MED ORDER — LEVOFLOXACIN 250 MG PO TABS: 250.0000 mg | ORAL_TABLET | Freq: Every day | ORAL | 0 refills | Status: AC

## 2016-06-18 MED ORDER — TAMSULOSIN HCL 0.4 MG PO CAPS: 0.4000 mg | ORAL_CAPSULE | Freq: Every day | ORAL | 0 refills | Status: DC

## 2016-06-18 NOTE — ED Triage Notes (Signed)
Pt reports painful urination and right side groin pain that began yesterday.

## 2016-06-18 NOTE — ED Notes (Signed)
This nurse attempted IV access twice unsuccessfully.

## 2016-06-18 NOTE — ED Provider Notes (Signed)
[facility] Emergency Department Provider Note    None    (approximate)  I have reviewed the triage vital signs and the nursing notes.   HISTORY  Chief Complaint Dysuria and Groin Pain    HPI St Riece Sabine is a 83 y.o. male presenting with chief complaint of right groin pain and dysuria is been going on for the past several days. Since she had similar episode in the past treated with antibiotics. States he does have a history of enlarged prostate. Denies any flank pain at this time but has had stents in the past. No measured fevers. No nausea or vomiting.   Past Medical History:  Diagnosis Date  . High cholesterol   . Hypertension   . Prostate disorder    No family history on file. Past Surgical History:  Procedure Laterality Date  . HERNIA REPAIR     Patient Active Problem List   Diagnosis Date Noted  . Essential hypertension 10/22/2015  . Hyperlipidemia 10/22/2015  . Benign prostatic hyperplasia 10/22/2015      Prior to Admission medications   Medication Sig Start Date End Date Taking? Authorizing Provider  aspirin 81 MG tablet Take 1 tablet (81 mg total) by mouth daily. 05/31/16   Juline Patch, MD  carbamide peroxide (DEBROX) 6.5 % otic solution Place 5 drops into both ears 2 (two) times daily. 03/24/16   Juline Patch, MD  losartan-hydrochlorothiazide (HYZAAR) 50-12.5 MG tablet Take 1 tablet by mouth daily. 05/31/16   Juline Patch, MD  omeprazole (PRILOSEC) 20 MG capsule Take 1 capsule (20 mg total) by mouth daily. 05/31/16   Juline Patch, MD  simvastatin (ZOCOR) 40 MG tablet Take 1 tablet (40 mg total) by mouth at bedtime. 05/31/16   Juline Patch, MD  tamsulosin (FLOMAX) 0.4 MG CAPS capsule Take 1 capsule (0.4 mg total) by mouth daily. 05/31/16   Juline Patch, MD    Allergies Patient has no known allergies.    Social History Social History  Substance Use Topics  . Smoking status: Never Smoker  . Smokeless tobacco: Not on  file  . Alcohol use No    Review of Systems Patient denies headaches, rhinorrhea, blurry vision, numbness, shortness of breath, chest pain, edema, cough, abdominal pain, nausea, vomiting, diarrhea, dysuria, fevers, rashes or hallucinations unless otherwise stated above in HPI. ____________________________________________   PHYSICAL EXAM:  VITAL SIGNS: Vitals:   06/18/16 1032  BP: (!) 130/51  Pulse: (!) 58  Resp: 18  Temp: 99.3 F (37.4 C)    Constitutional: Alert and oriented. Well appearing and in no acute distress. Eyes: Conjunctivae are normal. PERRL. EOMI. Head: Atraumatic. Nose: No congestion/rhinnorhea. Mouth/Throat: Mucous membranes are moist.  Oropharynx non-erythematous. Neck: No stridor. Painless ROM. No cervical spine tenderness to palpation Hematological/Lymphatic/Immunilogical: No cervical lymphadenopathy. Cardiovascular: Normal rate, regular rhythm. Grossly normal heart sounds.  Good peripheral circulation. Respiratory: Normal respiratory effort.  No retractions. Lungs CTAB. Gastrointestinal: Soft and nontender. No distention. No abdominal bruits. No CVA tenderness. Genitourinary:  No hernia, no lesions Musculoskeletal: No lower extremity tenderness nor edema.  No joint effusions. Neurologic:  Normal speech and language. No gross focal neurologic deficits are appreciated. No gait instability. Skin:  Skin is warm, dry and intact. No rash noted. Psychiatric: Mood and affect are normal. Speech and behavior are normal.  ____________________________________________   LABS (all labs ordered are listed, but only abnormal results are displayed)  Results for orders placed or performed during the hospital  encounter of 06/18/16 (from the past 24 hour(s))  Urinalysis, Complete w Microscopic     Status: Abnormal   Collection Time: 06/18/16 10:35 AM  Result Value Ref Range   Color, Urine YELLOW (A) YELLOW   APPearance CLOUDY (A) CLEAR   Specific Gravity, Urine 1.016  1.005 - 1.030   pH 5.0 5.0 - 8.0   Glucose, UA NEGATIVE NEGATIVE mg/dL   Hgb urine dipstick LARGE (A) NEGATIVE   Bilirubin Urine NEGATIVE NEGATIVE   Ketones, ur NEGATIVE NEGATIVE mg/dL   Protein, ur 100 (A) NEGATIVE mg/dL   Nitrite NEGATIVE NEGATIVE   Leukocytes, UA LARGE (A) NEGATIVE   RBC / HPF TOO NUMEROUS TO COUNT 0 - 5 RBC/hpf   WBC, UA TOO NUMEROUS TO COUNT 0 - 5 WBC/hpf   Bacteria, UA MANY (A) NONE SEEN   Squamous Epithelial / LPF 0-5 (A) NONE SEEN   WBC Clumps PRESENT    Mucous PRESENT    ____________________________________________  EKG ____________________________________________  RADIOLOGY  I personally reviewed all radiographic images ordered to evaluate for the above acute complaints and reviewed radiology reports and findings.  These findings were personally discussed with the patient.  Please see medical record for radiology report.  ____________________________________________   PROCEDURES  Procedure(s) performed:  Procedures    Critical Care performed: no ____________________________________________   INITIAL IMPRESSION / ASSESSMENT AND PLAN / ED COURSE  Pertinent labs & imaging results that were available during my care of the patient were reviewed by me and considered in my medical decision making (see chart for details).  DDX: uti, hernia, sti, prostatitis, stone  St Xuan Menor is a 83 y.o. who presents to the ED with Right groin pain and dysuria. Patient with a benign abdominal exam. UA shows evidence of urinary tract infection. Does have a history of present enlarged prostate. No evidence of prostatic abscess on imaging. Patient with mild cytosis which is likely secondary to UTI. He is otherwise hemodynamically stable and without any altered mental status. CT imaging shows evidence of stones but no evidence of hydronephrosis or acute abnormality. I do feel patient is stable for outpatient follow-up. He tolerated a dose of oral Levaquin which will  cover his UTI M any component of prostatitis. I provided referral to urology.  Patient was able to tolerate PO and was able to ambulate with a steady gait.  Have discussed with the patient and available family all diagnostics and treatments performed thus far and all questions were answered to the best of my ability. The patient demonstrates understanding and agreement with plan.       ____________________________________________   FINAL CLINICAL IMPRESSION(S) / ED DIAGNOSES  Final diagnoses:  Dysuria  Benign prostatic hyperplasia with urinary hesitancy      NEW MEDICATIONS STARTED DURING THIS VISIT:  New Prescriptions   No medications on file     Note:  This document was prepared using Dragon voice recognition software and may include unintentional dictation errors.    Merlyn Lot, MD 06/18/16 [phone]

## 2016-06-20 LAB — URINE CULTURE: Culture: >=100000 — AB

## 2016-06-29 ENCOUNTER — Ambulatory Visit: Payer: Commercial Managed Care - HMO | Admitting: Urology

## 2016-07-14 DIAGNOSIS — H40153 Residual stage of open-angle glaucoma, bilateral: Secondary | ICD-10-CM | POA: Diagnosis not present

## 2016-07-14 DIAGNOSIS — H524 Presbyopia: Secondary | ICD-10-CM | POA: Diagnosis not present

## 2016-07-22 ENCOUNTER — Ambulatory Visit: Payer: Commercial Managed Care - HMO | Admitting: Urology

## 2016-08-12 ENCOUNTER — Ambulatory Visit: Payer: Medicare HMO

## 2016-08-31 DIAGNOSIS — M26609 Unspecified temporomandibular joint disorder, unspecified side: Secondary | ICD-10-CM | POA: Diagnosis not present

## 2016-08-31 DIAGNOSIS — H6123 Impacted cerumen, bilateral: Secondary | ICD-10-CM | POA: Diagnosis not present

## 2016-08-31 DIAGNOSIS — H9209 Otalgia, unspecified ear: Secondary | ICD-10-CM | POA: Diagnosis not present

## 2016-10-25 DIAGNOSIS — H40153 Residual stage of open-angle glaucoma, bilateral: Secondary | ICD-10-CM | POA: Diagnosis not present

## 2017-01-24 ENCOUNTER — Other Ambulatory Visit: Payer: Self-pay | Admitting: Family Medicine

## 2017-01-24 DIAGNOSIS — I10 Essential (primary) hypertension: Secondary | ICD-10-CM

## 2017-01-24 DIAGNOSIS — E785 Hyperlipidemia, unspecified: Secondary | ICD-10-CM

## 2017-01-24 DIAGNOSIS — K219 Gastro-esophageal reflux disease without esophagitis: Secondary | ICD-10-CM

## 2017-03-17 ENCOUNTER — Other Ambulatory Visit: Payer: Self-pay

## 2017-03-21 ENCOUNTER — Ambulatory Visit (INDEPENDENT_AMBULATORY_CARE_PROVIDER_SITE_OTHER): Payer: Medicare HMO | Admitting: Family Medicine

## 2017-03-21 ENCOUNTER — Encounter: Payer: Self-pay | Admitting: Family Medicine

## 2017-03-21 VITALS — BP 134/62 | HR 60 | Ht 70.0 in | Wt 154.0 lb

## 2017-03-21 DIAGNOSIS — M26609 Unspecified temporomandibular joint disorder, unspecified side: Secondary | ICD-10-CM

## 2017-03-21 DIAGNOSIS — Z23 Encounter for immunization: Secondary | ICD-10-CM | POA: Diagnosis not present

## 2017-03-21 DIAGNOSIS — I10 Essential (primary) hypertension: Secondary | ICD-10-CM | POA: Diagnosis not present

## 2017-03-21 DIAGNOSIS — M1711 Unilateral primary osteoarthritis, right knee: Secondary | ICD-10-CM

## 2017-03-21 DIAGNOSIS — K219 Gastro-esophageal reflux disease without esophagitis: Secondary | ICD-10-CM

## 2017-03-21 DIAGNOSIS — N401 Enlarged prostate with lower urinary tract symptoms: Secondary | ICD-10-CM | POA: Diagnosis not present

## 2017-03-21 DIAGNOSIS — E785 Hyperlipidemia, unspecified: Secondary | ICD-10-CM

## 2017-03-21 MED ORDER — SIMVASTATIN 40 MG PO TABS: 40.0000 mg | ORAL_TABLET | Freq: Every day | ORAL | 1 refills | Status: DC

## 2017-03-21 MED ORDER — TAMSULOSIN HCL 0.4 MG PO CAPS: 0.4000 mg | ORAL_CAPSULE | Freq: Every day | ORAL | 1 refills | Status: DC

## 2017-03-21 MED ORDER — OMEPRAZOLE 20 MG PO CPDR: 20.0000 mg | DELAYED_RELEASE_CAPSULE | Freq: Every day | ORAL | 1 refills | Status: DC

## 2017-03-21 MED ORDER — PREDNISONE 10 MG PO TABS: 10.0000 mg | ORAL_TABLET | Freq: Every day | ORAL | 0 refills | Status: DC

## 2017-03-21 MED ORDER — LOSARTAN POTASSIUM-HCTZ 50-12.5 MG PO TABS: 1.0000 | ORAL_TABLET | Freq: Every day | ORAL | 1 refills | Status: DC

## 2017-03-21 NOTE — Progress Notes (Signed)
Name: Eric [name]   MRN: [id number]    DOB: [birth year]   Date:03/21/2017       Progress Note  Subjective  Chief Complaint  Chief Complaint  Patient presents with  . Hyperlipidemia  . Hypertension  . Gastroesophageal Reflux  . Benign Prostatic Hypertrophy     Patient complains of episodic right TMJ pain. "constantly chews gum"   Hyperlipidemia  This is a chronic problem. The current episode started more than 1 year ago. The problem is controlled. Recent lipid tests were reviewed and are normal. He has no history of chronic renal disease, diabetes, hypothyroidism, liver disease, obesity or nephrotic syndrome. There are no known factors aggravating his hyperlipidemia. Pertinent negatives include no chest pain, focal sensory loss, focal weakness, leg pain, myalgias or shortness of breath. Current antihyperlipidemic treatment includes statins. The current treatment provides moderate improvement of lipids. There are no compliance problems.  Risk factors for coronary artery disease include dyslipidemia.  Hypertension  This is a chronic problem. The current episode started more than 1 year ago. The problem has been waxing and waning since onset. The problem is controlled. Pertinent negatives include no anxiety, blurred vision, chest pain, headaches, malaise/fatigue, neck pain, orthopnea, palpitations, peripheral edema, PND, shortness of breath or sweats. There are no associated agents to hypertension. Risk factors for coronary artery disease include dyslipidemia. Past treatments include angiotensin blockers and diuretics. The current treatment provides moderate improvement. There are no compliance problems.  There is no history of angina, kidney disease, CAD/MI, CVA, heart failure, left ventricular hypertrophy, PVD or retinopathy. There is no history of chronic renal disease, a hypertension causing med or renovascular disease.  Gastroesophageal Reflux  He reports no abdominal pain, no chest pain,  no choking, no coughing, no dysphagia, no heartburn, no nausea, no sore throat or no wheezing. This is a chronic problem. The current episode started more than 1 year ago. The problem occurs rarely. The problem has been unchanged. Pertinent negatives include no melena or weight loss. He has tried a PPI for the symptoms. The treatment provided moderate relief.  Benign Prostatic Hypertrophy  This is a chronic problem. The current episode started more than 1 year ago. The problem has been waxing and waning since onset. Irritative symptoms do not include frequency or urgency. Obstructive symptoms include dribbling, a slower stream and a weak stream. Obstructive symptoms do not include an intermittent stream or straining. Pertinent negatives include no chills, dysuria, genital pain, hematuria, hesitancy, nausea or vomiting. Nothing aggravates the symptoms. Past treatments include tamsulosin. The treatment provided moderate relief.  Knee Pain   The incident occurred more than 1 week ago (chronic). There was no injury mechanism. The pain is present in the right knee. The quality of the pain is described as aching. The pain is at a severity of 4/10. The pain is moderate. The pain has been fluctuating since onset. Pertinent negatives include no inability to bear weight, loss of motion or tingling. The symptoms are aggravated by weight bearing (driving). The treatment provided moderate relief.    No problem-specific Assessment & Plan notes found for this encounter.   Past Medical History:  Diagnosis Date  . High cholesterol   . Hypertension   . Prostate disorder     Past Surgical History:  Procedure Laterality Date  . HERNIA REPAIR      No family history on file.  Social History   Social History  . Marital status: Married    Spouse name: N/A  .  Number of children: N/A  . Years of education: N/A   Occupational History  . Not on file.   Social History Main Topics  . Smoking status: Never  Smoker  . Smokeless tobacco: Never Used  . Alcohol use No  . Drug use: No  . Sexual activity: Not Currently   Other Topics Concern  . Not on file   Social History Narrative  . No narrative on file    No Known Allergies  Outpatient Medications Prior to Visit  Medication Sig Dispense Refill  . aspirin 81 MG tablet Take 1 tablet (81 mg total) by mouth daily. 30 tablet 11  . carbamide peroxide (DEBROX) 6.5 % otic solution Place 5 drops into both ears 2 (two) times daily. 15 mL 0  . losartan-hydrochlorothiazide (HYZAAR) 50-12.5 MG tablet TAKE 1 TABLET EVERY DAY 90 tablet 0  . omeprazole (PRILOSEC) 20 MG capsule TAKE 1 CAPSULE EVERY DAY 90 capsule 0  . simvastatin (ZOCOR) 40 MG tablet TAKE 1 TABLET AT BEDTIME 90 tablet 0  . tamsulosin (FLOMAX) 0.4 MG CAPS capsule Take 1 capsule (0.4 mg total) by mouth daily. 90 capsule 1  . tamsulosin (FLOMAX) 0.4 MG CAPS capsule Take 1 capsule (0.4 mg total) by mouth daily after supper. 10 capsule 0  . tamsulosin (FLOMAX) 0.4 MG CAPS capsule TAKE 1 CAPSULE EVERY DAY 90 capsule 0   No facility-administered medications prior to visit.     Review of Systems  Constitutional: Negative for chills, fever, malaise/fatigue and weight loss.  HENT: Negative for ear discharge, ear pain and sore throat.   Eyes: Negative for blurred vision.  Respiratory: Negative for cough, sputum production, choking, shortness of breath and wheezing.   Cardiovascular: Negative for chest pain, palpitations, orthopnea, leg swelling and PND.  Gastrointestinal: Negative for abdominal pain, blood in stool, constipation, diarrhea, dysphagia, heartburn, melena, nausea and vomiting.  Genitourinary: Negative for dysuria, frequency, hematuria, hesitancy and urgency.  Musculoskeletal: Negative for back pain, joint pain, myalgias and neck pain.  Skin: Negative for rash.  Neurological: Negative for dizziness, tingling, sensory change, focal weakness and headaches.  Endo/Heme/Allergies:  Negative for environmental allergies and polydipsia. Does not bruise/bleed easily.  Psychiatric/Behavioral: Negative for depression and suicidal ideas. The patient is not nervous/anxious and does not have insomnia.      Objective  Vitals:   03/21/17 1018  BP: 134/62  Pulse: 60  Weight: 154 lb (69.9 kg)  Height: 5\' 10"  (1.778 m)    Physical Exam  Constitutional: He is oriented to person, place, and time and well-developed, well-nourished, and in no distress.  HENT:  Head: Normocephalic.  Right Ear: External ear normal.  Left Ear: External ear normal.  Nose: Nose normal.  Mouth/Throat: Oropharynx is clear and moist.  Eyes: Pupils are equal, round, and reactive to light. Conjunctivae and EOM are normal. Right eye exhibits no discharge. Left eye exhibits no discharge. No scleral icterus.  Neck: Normal range of motion. Neck supple. No JVD present. No tracheal deviation present. No thyromegaly present.  Cardiovascular: Normal rate, regular rhythm, normal heart sounds and intact distal pulses.  Exam reveals no gallop and no friction rub.   No murmur heard. Pulmonary/Chest: Breath sounds normal. No respiratory distress. He has no wheezes. He has no rales.  Abdominal: Soft. Bowel sounds are normal. He exhibits no mass. There is no hepatosplenomegaly. There is no tenderness. There is no rebound, no guarding and no CVA tenderness.  Musculoskeletal: Normal range of motion. He exhibits no edema or tenderness.  Lymphadenopathy:    He has no cervical adenopathy.  Neurological: He is alert and oriented to person, place, and time. He has normal sensation, normal strength, normal reflexes and intact cranial nerves. No cranial nerve deficit.  Skin: Skin is warm. No rash noted.  Psychiatric: Mood and affect normal.  Nursing note and vitals reviewed.     Assessment & Plan  Problem List Items Addressed This Visit      Cardiovascular and Mediastinum   Essential hypertension - Primary    Relevant Medications   losartan-hydrochlorothiazide (HYZAAR) 50-12.5 MG tablet   simvastatin (ZOCOR) 40 MG tablet   Other Relevant Orders   Renal Function Panel     Genitourinary   Benign prostatic hyperplasia   Relevant Medications   tamsulosin (FLOMAX) 0.4 MG CAPS capsule   Other Relevant Orders   PSA     Other   Hyperlipidemia   Relevant Medications   losartan-hydrochlorothiazide (HYZAAR) 50-12.5 MG tablet   simvastatin (ZOCOR) 40 MG tablet   Other Relevant Orders   Lipid Profile    Other Visit Diagnoses    Gastroesophageal reflux disease, esophagitis presence not specified       Relevant Medications   omeprazole (PRILOSEC) 20 MG capsule   Influenza vaccine needed       Relevant Orders   Flu vaccine HIGH DOSE PF (Completed)   Need for vaccination against Streptococcus pneumoniae using pneumococcal conjugate vaccine 13       Relevant Orders   Pneumococcal conjugate vaccine 13-valent IM (Completed)   TMJ (temporomandibular joint disorder)       Primary osteoarthritis of right knee       Relevant Medications   predniSONE (DELTASONE) 10 MG tablet      Meds ordered this encounter  Medications  . tamsulosin (FLOMAX) 0.4 MG CAPS capsule    Sig: Take 1 capsule (0.4 mg total) by mouth daily.    Dispense:  90 capsule    Refill:  1  . losartan-hydrochlorothiazide (HYZAAR) 50-12.5 MG tablet    Sig: Take 1 tablet by mouth daily.    Dispense:  90 tablet    Refill:  1  . omeprazole (PRILOSEC) 20 MG capsule    Sig: Take 1 capsule (20 mg total) by mouth daily.    Dispense:  90 capsule    Refill:  1    sched appt  . simvastatin (ZOCOR) 40 MG tablet    Sig: Take 1 tablet (40 mg total) by mouth at bedtime.    Dispense:  90 tablet    Refill:  1  . predniSONE (DELTASONE) 10 MG tablet    Sig: Take 1 tablet (10 mg total) by mouth daily with breakfast.    Dispense:  14 tablet    Refill:  0      Dr. Macon [facility]  03/21/17

## 2017-03-23 DIAGNOSIS — H40153 Residual stage of open-angle glaucoma, bilateral: Secondary | ICD-10-CM | POA: Diagnosis not present

## 2017-06-29 IMAGING — CT CT RENAL STONE PROTOCOL
3 of 4 series · 7 of 46 positions shown, 13 images · non-contrast
Comparison: None.

CLINICAL DATA: Left-sided flank pain

EXAM:
CT ABDOMEN AND PELVIS WITHOUT CONTRAST
TECHNIQUE: Multidetector CT imaging of the abdomen and pelvis was performed
following the standard protocol without IV contrast.

[Series 4: lung · axial · 0.69mm/px · z∈[-440,-400]mm · 3 of 18 slices shown, 7 images]
[im 5/18  soft-tissue]
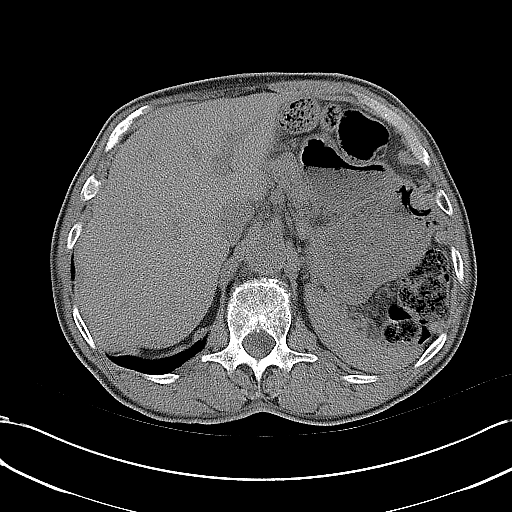
[im 5/18  lung]
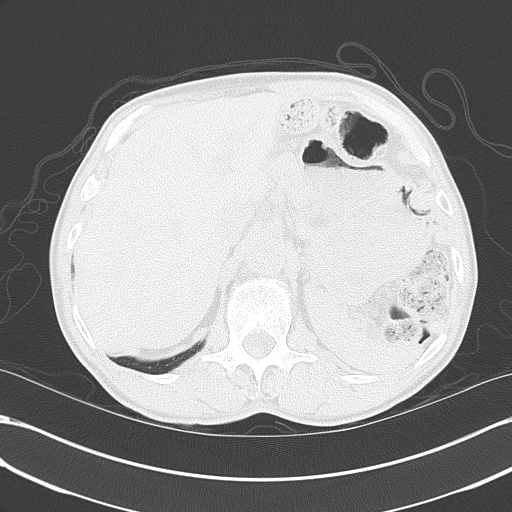
[im 5/18  bone]
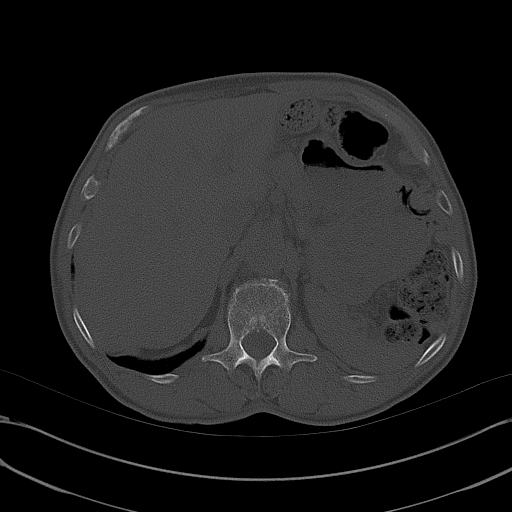
[im 9/18  soft-tissue]
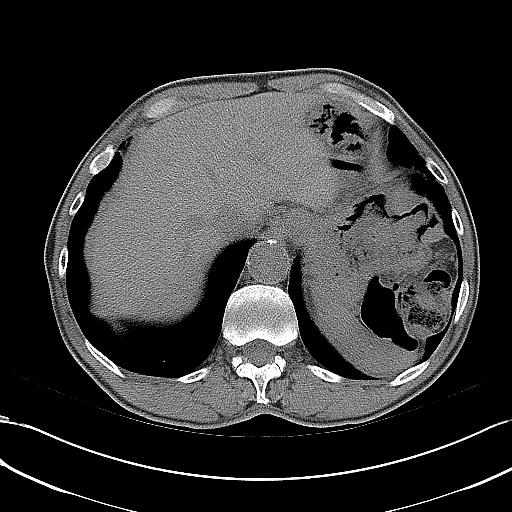
[im 9/18  lung]
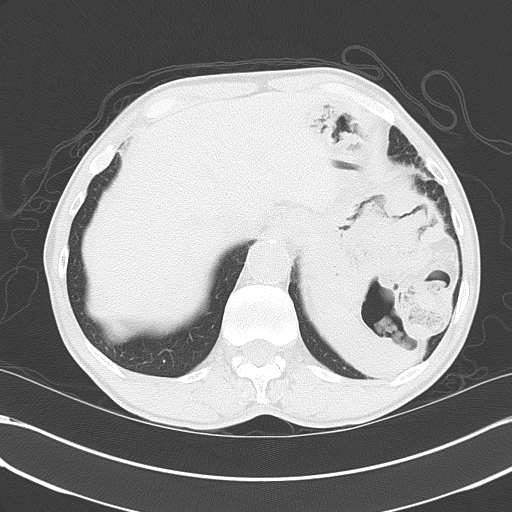
[im 13/18  soft-tissue]
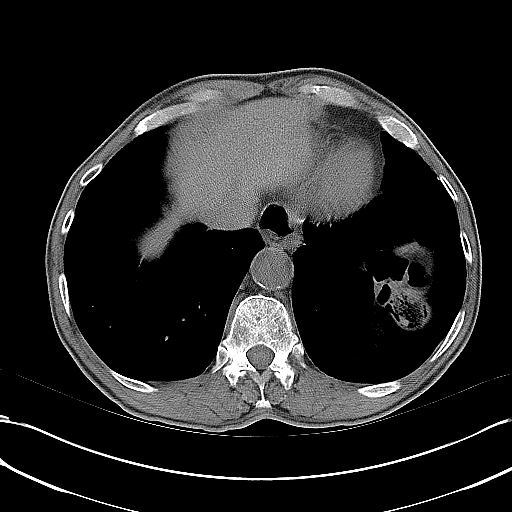
[im 13/18  lung]
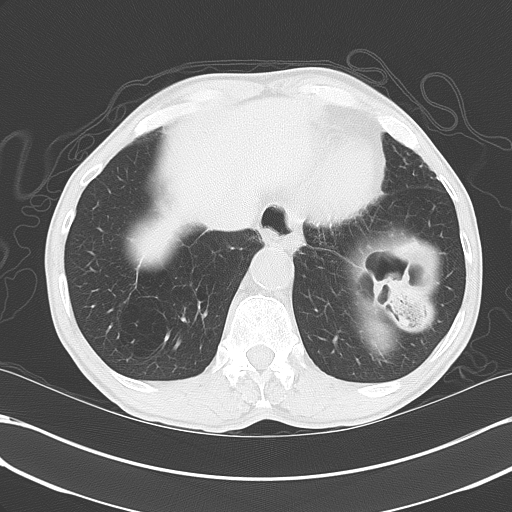

[Series 5: coronal · coronal · 0.72mm/px · 3 of 120 slices shown, 4 images]
[im 40/120  soft-tissue]
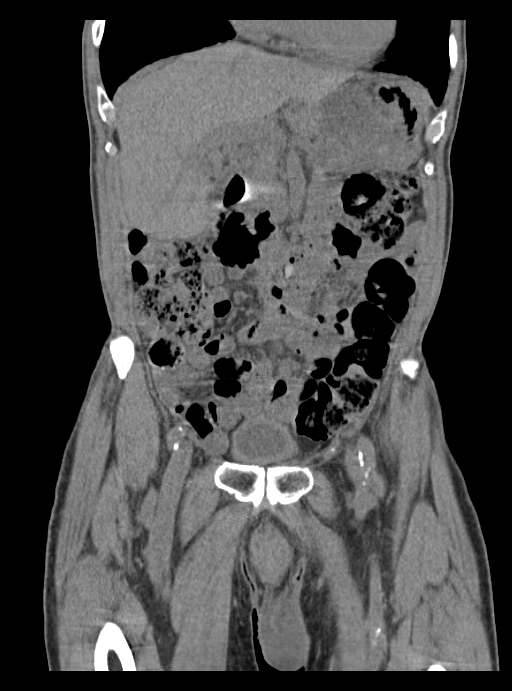
[im 53/120  soft-tissue]
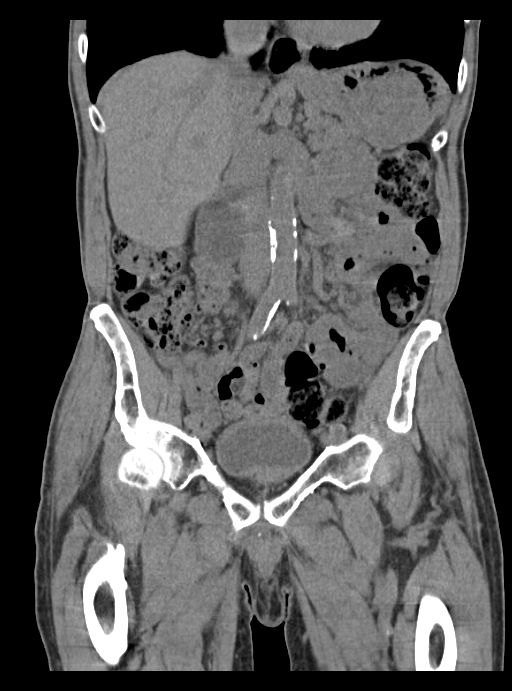
[im 53/120  bone]
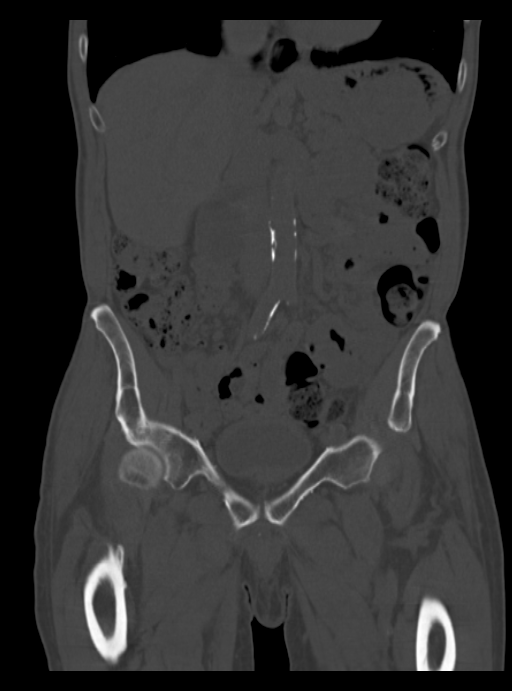
[im 67/120  soft-tissue]
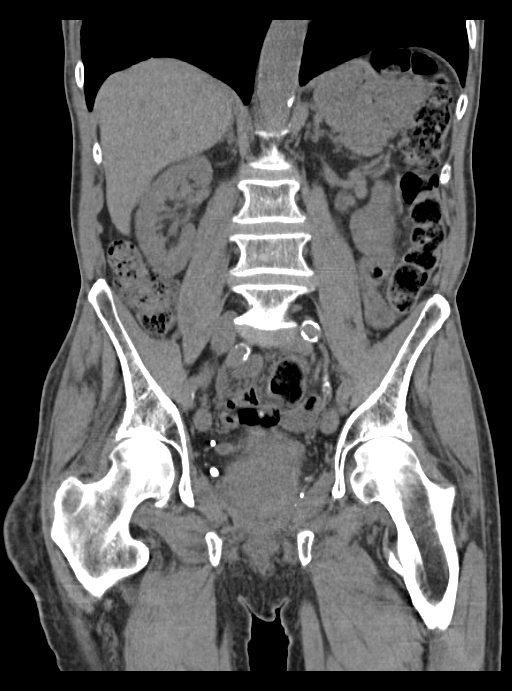

[Series 6: sagittal · sagittal · 0.56mm/px · 1 of 162 slices shown, 2 images]
[im 54/162  soft-tissue]
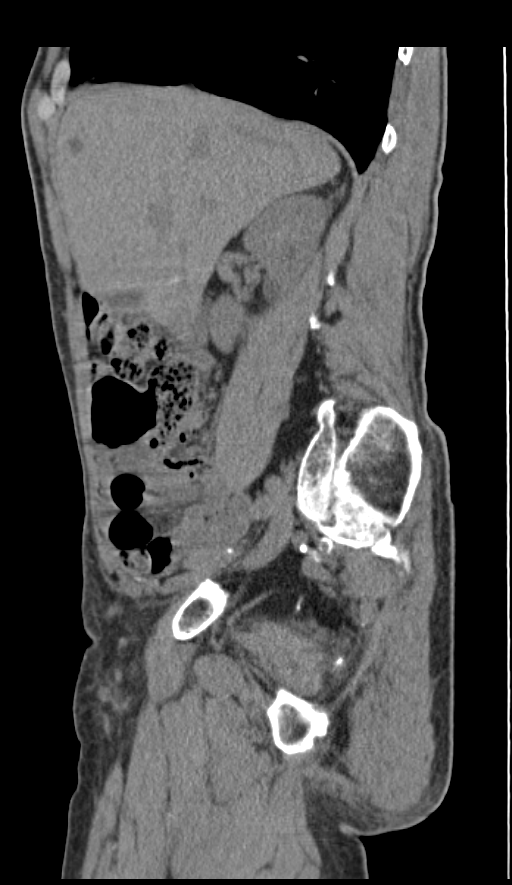
[im 54/162  bone]
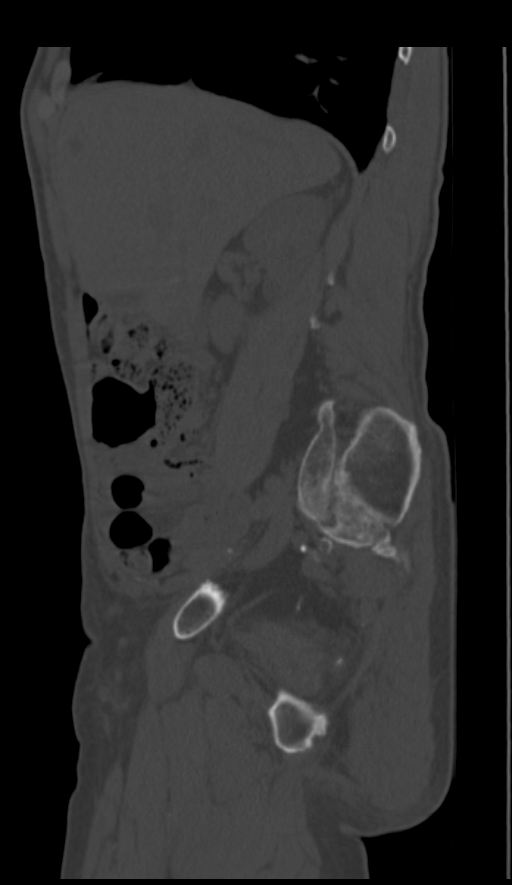

[7 of 46 positions shown; findings below may reference images not displayed]

FINDINGS: Lung bases demonstrate some mild emphysematous changes. No focal
infiltrate is seen. The liver shows a rounded hypodensity in the
medial segment of the left lobe of the liver best seen on image
number 12 of series 2 consistent with a small cysts. The spleen,
adrenal glands and pancreas are within normal limits. Kidney show no
obstructive changes. A tiny nonobstructing stone is noted in the
upper pole of the left kidney. The ureters are within normal limits
bilaterally.

Diffuse aortoiliac calcifications are seen. The bladder is partially
distended. The prostate is enlarged. The appendix is within normal
limits. No free pelvic fluid is seen. Large and small bowel show no
obstructive changes.
IMPRESSION: Tiny nonobstructing left renal stone. No acute obstructive changes
are seen.

No acute abnormality is seen.

## 2017-07-25 DIAGNOSIS — E113393 Type 2 diabetes mellitus with moderate nonproliferative diabetic retinopathy without macular edema, bilateral: Secondary | ICD-10-CM | POA: Diagnosis not present

## 2017-07-25 DIAGNOSIS — H40153 Residual stage of open-angle glaucoma, bilateral: Secondary | ICD-10-CM | POA: Diagnosis not present

## 2017-07-25 DIAGNOSIS — H524 Presbyopia: Secondary | ICD-10-CM | POA: Diagnosis not present

## 2017-08-24 ENCOUNTER — Ambulatory Visit (INDEPENDENT_AMBULATORY_CARE_PROVIDER_SITE_OTHER): Payer: Medicare HMO | Admitting: Family Medicine

## 2017-08-24 ENCOUNTER — Encounter: Payer: Self-pay | Admitting: Family Medicine

## 2017-08-24 VITALS — BP 132/64 | HR 72 | Ht 70.0 in | Wt 156.0 lb

## 2017-08-24 DIAGNOSIS — E785 Hyperlipidemia, unspecified: Secondary | ICD-10-CM

## 2017-08-24 DIAGNOSIS — I10 Essential (primary) hypertension: Secondary | ICD-10-CM | POA: Diagnosis not present

## 2017-08-24 DIAGNOSIS — K219 Gastro-esophageal reflux disease without esophagitis: Secondary | ICD-10-CM | POA: Diagnosis not present

## 2017-08-24 DIAGNOSIS — Z024 Encounter for examination for driving license: Secondary | ICD-10-CM

## 2017-08-24 DIAGNOSIS — N401 Enlarged prostate with lower urinary tract symptoms: Secondary | ICD-10-CM | POA: Diagnosis not present

## 2017-08-24 MED ORDER — LOSARTAN POTASSIUM-HCTZ 50-12.5 MG PO TABS: 1.0000 | ORAL_TABLET | Freq: Every day | ORAL | 1 refills | Status: DC

## 2017-08-24 MED ORDER — OMEPRAZOLE 20 MG PO CPDR
20.0000 mg | DELAYED_RELEASE_CAPSULE | Freq: Every day | ORAL | 1 refills | Status: DC
Start: 2017-08-24 — End: 2018-01-17

## 2017-08-24 MED ORDER — TAMSULOSIN HCL 0.4 MG PO CAPS: 0.4000 mg | ORAL_CAPSULE | Freq: Every day | ORAL | 1 refills | Status: DC

## 2017-08-24 MED ORDER — SIMVASTATIN 40 MG PO TABS: 40.0000 mg | ORAL_TABLET | Freq: Every day | ORAL | 1 refills | Status: DC

## 2017-08-24 NOTE — Progress Notes (Signed)
Name: Eric [name]   MRN: [id number]    DOB: 06-12-1923   Date:08/24/2017       Progress Note  Subjective  Chief Complaint  Chief Complaint  Patient presents with  . 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  Hypertension  This is a chronic problem. The current episode started more than 1 year ago. The problem is unchanged. The problem is controlled. Pertinent negatives include no anxiety, blurred vision, chest pain, headaches, malaise/fatigue, neck pain, orthopnea, palpitations, peripheral edema, PND, shortness of breath or sweats. There are no associated agents to hypertension. Risk factors for coronary artery disease include dyslipidemia and male gender. Past treatments include angiotensin blockers and diuretics. The current treatment provides mild improvement. There are no compliance problems.  There is no history of angina, kidney disease, CAD/MI, CVA, heart failure, left ventricular hypertrophy, PVD or retinopathy. There is no history of chronic renal disease, a hypertension causing med or renovascular disease.  Hyperlipidemia  This is a chronic problem. The problem is controlled. He has no history of chronic renal disease, diabetes or obesity. Factors aggravating his hyperlipidemia include thiazides. Pertinent negatives include no chest pain, focal weakness, myalgias or shortness of breath. Current antihyperlipidemic treatment includes statins. There are no compliance problems.  Risk factors for coronary artery disease include dyslipidemia and hypertension.  Benign Prostatic Hypertrophy  This is a chronic problem. The current episode started more than 1 year ago. The problem has been waxing and waning since onset. Irritative symptoms do not include frequency or urgency. Pertinent negatives include no chills, dysuria, hematuria or nausea. Past treatments include tamsulosin. The treatment provided moderate relief.   Gastroesophageal Reflux  He reports no abdominal pain, no chest pain, no coughing, no dysphagia, no heartburn, no nausea, no sore throat or no wheezing. This is a chronic problem. The current episode started more than 1 year ago. Nothing aggravates the symptoms. Pertinent negatives include no melena or weight loss. There are no known risk factors. He has tried a PPI for the symptoms.    No problem-specific Assessment & Plan notes found for this encounter.   Past Medical History:  Diagnosis Date  . High cholesterol   . Hypertension   . Prostate disorder     Past Surgical History:  Procedure Laterality Date  . HERNIA REPAIR      History reviewed. No pertinent family history.  Social History   Socioeconomic History  . Marital status: Married    Spouse name: Not on file  . Number of children: Not on file  . Years of education: Not on file  . Highest education level: Not on file  Occupational History  . Not on file  Social Needs  . Financial resource strain: Not on file  . Food insecurity:    Worry: Not on file    Inability: Not on file  . Transportation needs:    Medical: Not on file    Non-medical: Not on file  Tobacco Use  . Smoking status: Never Smoker  . Smokeless tobacco: Never Used  Substance and Sexual Activity  . Alcohol use: No  . Drug use: No  . Sexual activity: Not Currently  Lifestyle  . Physical activity:    Days per week: Not on file    Minutes per session: Not on file  . Stress: Not on file  Relationships  . Social connections:    Talks on phone:  Not on file    Gets together: Not on file    Attends religious service: Not on file    Active member of club or organization: Not on file    Attends meetings of clubs or organizations: Not on file    Relationship status: Not on file  . Intimate partner violence:    Fear of current or ex partner: Not on file    Emotionally abused: Not on file    Physically abused: Not on file    Forced sexual  activity: Not on file  Other Topics Concern  . Not on file  Social History Narrative  . Not on file    No Known Allergies  Outpatient Medications Prior to Visit  Medication Sig Dispense Refill  . aspirin 81 MG tablet Take 1 tablet (81 mg total) by mouth daily. 30 tablet 11  . carbamide peroxide (DEBROX) 6.5 % otic solution Place 5 drops into both ears 2 (two) times daily. 15 mL 0  . losartan-hydrochlorothiazide (HYZAAR) 50-12.5 MG tablet Take 1 tablet by mouth daily. 90 tablet 1  . omeprazole (PRILOSEC) 20 MG capsule Take 1 capsule (20 mg total) by mouth daily. 90 capsule 1  . simvastatin (ZOCOR) 40 MG tablet Take 1 tablet (40 mg total) by mouth at bedtime. 90 tablet 1  . tamsulosin (FLOMAX) 0.4 MG CAPS capsule Take 1 capsule (0.4 mg total) by mouth daily. 90 capsule 1  . predniSONE (DELTASONE) 10 MG tablet Take 1 tablet (10 mg total) by mouth daily with breakfast. 14 tablet 0   No facility-administered medications prior to visit.     Review of Systems  Constitutional: Negative for chills, fever, malaise/fatigue and weight loss.  HENT: Negative for ear discharge, ear pain and sore throat.   Eyes: Negative for blurred vision.  Respiratory: Negative for cough, sputum production, shortness of breath and wheezing.   Cardiovascular: Negative for chest pain, palpitations, orthopnea, leg swelling and PND.  Gastrointestinal: Negative for abdominal pain, blood in stool, constipation, diarrhea, dysphagia, heartburn, melena and nausea.  Genitourinary: Negative for dysuria, frequency, hematuria and urgency.  Musculoskeletal: Negative for back pain, joint pain, myalgias and neck pain.  Skin: Negative for rash.  Neurological: Negative for dizziness, tingling, sensory change, focal weakness and headaches.  Endo/Heme/Allergies: Negative for environmental allergies and polydipsia. Does not bruise/bleed easily.  Psychiatric/Behavioral: Negative for depression and suicidal ideas. The patient is not  nervous/anxious and does not have insomnia.      Objective  Vitals:   08/24/17 0858  BP: 132/64  Pulse: 72  Weight: 156 lb (70.8 kg)  Height: 5\' 10"  (1.778 m)    Physical Exam  Constitutional: He is oriented to person, place, and time and well-developed, well-nourished, and in no distress.  HENT:  Head: Normocephalic.  Right Ear: External ear normal.  Left Ear: External ear normal.  Nose: Nose normal.  Mouth/Throat: Oropharynx is clear and moist.  Eyes: Pupils are equal, round, and reactive to light. Conjunctivae and EOM are normal. Right eye exhibits no discharge. Left eye exhibits no discharge. No scleral icterus.  Neck: Normal range of motion. Neck supple. No JVD present. No tracheal deviation present. No thyromegaly present.  Cardiovascular: Normal rate, regular rhythm, normal heart sounds and intact distal pulses. Exam reveals no gallop and no friction rub.  No murmur heard. Pulmonary/Chest: Breath sounds normal. No respiratory distress. He has no wheezes. He has no rales.  Abdominal: Soft. Bowel sounds are normal. He exhibits no mass. There is no hepatosplenomegaly.  There is no tenderness. There is no rebound, no guarding and no CVA tenderness.  Musculoskeletal: Normal range of motion. He exhibits no edema or tenderness.  Lymphadenopathy:    He has no cervical adenopathy.  Neurological: He is alert and oriented to person, place, and time. He has normal sensation, normal strength and intact cranial nerves.  Skin: Skin is warm. No rash noted.  Psychiatric: Mood and affect normal.  Nursing note and vitals reviewed.     Assessment & Plan  Problem List Items Addressed This Visit      Cardiovascular and Mediastinum   Essential hypertension   Relevant Medications   losartan-hydrochlorothiazide (HYZAAR) 50-12.5 MG tablet   simvastatin (ZOCOR) 40 MG tablet   Other Relevant Orders   Renal Function Panel     Genitourinary   Benign prostatic hyperplasia   Relevant  Medications   tamsulosin (FLOMAX) 0.4 MG CAPS capsule   Other Relevant Orders   PSA     Other   Hyperlipidemia   Relevant Medications   losartan-hydrochlorothiazide (HYZAAR) 50-12.5 MG tablet   simvastatin (ZOCOR) 40 MG tablet   Other Relevant Orders   Lipid panel    Other Visit Diagnoses    Encounter for examination for driving license    -  Primary   Gastroesophageal reflux disease, esophagitis presence not specified       Relevant Medications   omeprazole (PRILOSEC) 20 MG capsule      Meds ordered this encounter  Medications  . tamsulosin (FLOMAX) 0.4 MG CAPS capsule    Sig: Take 1 capsule (0.4 mg total) by mouth daily.    Dispense:  90 capsule    Refill:  1  . losartan-hydrochlorothiazide (HYZAAR) 50-12.5 MG tablet    Sig: Take 1 tablet by mouth daily.    Dispense:  90 tablet    Refill:  1  . omeprazole (PRILOSEC) 20 MG capsule    Sig: Take 1 capsule (20 mg total) by mouth daily.    Dispense:  90 capsule    Refill:  1    sched appt  . simvastatin (ZOCOR) 40 MG tablet    Sig: Take 1 tablet (40 mg total) by mouth at bedtime.    Dispense:  90 tablet    Refill:  1      Dr. Otilio Miu [facility]  08/24/17

## 2017-08-25 LAB — RENAL FUNCTION PANEL
ALBUMIN: 3.8 g/dL (ref 3.2–4.6)
BUN/Creatinine Ratio: 22 (ref 10–24)
BUN: 26 mg/dL (ref 10–36)
CHLORIDE: 104 mmol/L (ref 96–106)
CO2: 23 mmol/L (ref 20–29)
Calcium: 8.8 mg/dL (ref 8.6–10.2)
Creatinine, Ser: 1.19 mg/dL (ref 0.76–1.27)
GFR calc Af Amer: 61 mL/min/{1.73_m2} (ref >59)
GFR calc non Af Amer: 53 mL/min/{1.73_m2} — ABNORMAL LOW (ref >59)
Glucose: 105 mg/dL — ABNORMAL HIGH (ref 65–99)
PHOSPHORUS: 3 mg/dL (ref 2.5–4.5)
Potassium: 4.3 mmol/L (ref 3.5–5.2)
Sodium: 140 mmol/L (ref 134–144)

## 2017-08-25 LAB — PSA: Prostate Specific Ag, Serum: 1.8 ng/mL (ref 0.0–4.0)

## 2017-08-25 LAB — LIPID PANEL
Chol/HDL Ratio: 2.5 ratio (ref 0.0–5.0)
Cholesterol, Total: 188 mg/dL (ref 100–199)
HDL: 76 mg/dL (ref >39)
LDL Calculated: 97 mg/dL (ref 0–99)
Triglycerides: 73 mg/dL (ref 0–149)
VLDL CHOLESTEROL CAL: 15 mg/dL (ref 5–40)

## 2017-10-31 ENCOUNTER — Ambulatory Visit (INDEPENDENT_AMBULATORY_CARE_PROVIDER_SITE_OTHER): Payer: Medicare HMO | Admitting: Family Medicine

## 2017-10-31 ENCOUNTER — Encounter: Payer: Self-pay | Admitting: Family Medicine

## 2017-10-31 VITALS — Ht 70.0 in | Wt 153.0 lb

## 2017-10-31 DIAGNOSIS — R001 Bradycardia, unspecified: Secondary | ICD-10-CM

## 2017-10-31 DIAGNOSIS — R42 Dizziness and giddiness: Secondary | ICD-10-CM

## 2017-10-31 DIAGNOSIS — I10 Essential (primary) hypertension: Secondary | ICD-10-CM

## 2017-10-31 NOTE — Assessment & Plan Note (Signed)
Elevated systolic BP with low normal diastolic reading. Patient encouraged to avoid salt and fatback.

## 2017-10-31 NOTE — Progress Notes (Signed)
Name: Eric [name]   MRN: [id number]    DOB: January 07, 1924   Date:10/31/2017       Progress Note  Subjective  Chief Complaint  Chief Complaint  Patient presents with  . Hypertension    feels "swimmy headed' while doing things or first getting up    Hypertension  This is a chronic problem. The current episode started more than 1 year ago. The problem has been waxing and waning since onset. The problem is controlled. Associated symptoms include shortness of breath. Pertinent negatives include no anxiety, blurred vision, chest pain, headaches, malaise/fatigue, neck pain, orthopnea, palpitations, peripheral edema, PND or sweats. There are no associated agents to hypertension. Past treatments include angiotensin blockers and diuretics. The current treatment provides moderate improvement. There are no compliance problems.  There is no history of angina, kidney disease, CAD/MI, CVA, heart failure, left ventricular hypertrophy, PVD or retinopathy. There is no history of chronic renal disease, a hypertension causing med or renovascular disease.    Essential hypertension Elevated systolic BP with low normal diastolic reading. Patient encouraged to avoid salt and fatback.   Past Medical History:  Diagnosis Date  . High cholesterol   . Hypertension   . Prostate disorder     Past Surgical History:  Procedure Laterality Date  . HERNIA REPAIR      No family history on file.  Social History   Socioeconomic History  . Marital status: Married    Spouse name: Not on file  . Number of children: Not on file  . Years of education: Not on file  . Highest education level: Not on file  Occupational History  . Not on file  Social Needs  . Financial resource strain: Not on file  . Food insecurity:    Worry: Not on file    Inability: Not on file  . Transportation needs:    Medical: Not on file    Non-medical: Not on file  Tobacco Use  . Smoking status: Never Smoker  . Smokeless tobacco:  Never Used  Substance and Sexual Activity  . Alcohol use: No  . Drug use: No  . Sexual activity: Not Currently  Lifestyle  . Physical activity:    Days per week: Not on file    Minutes per session: Not on file  . Stress: Not on file  Relationships  . Social connections:    Talks on phone: Not on file    Gets together: Not on file    Attends religious service: Not on file    Active member of club or organization: Not on file    Attends meetings of clubs or organizations: Not on file    Relationship status: Not on file  . Intimate partner violence:    Fear of current or ex partner: Not on file    Emotionally abused: Not on file    Physically abused: Not on file    Forced sexual activity: Not on file  Other Topics Concern  . Not on file  Social History Narrative  . Not on file    No Known Allergies  Outpatient Medications Prior to Visit  Medication Sig Dispense Refill  . aspirin EC 81 MG tablet Take 81 mg by mouth daily.    Marland Kitchen losartan-hydrochlorothiazide (HYZAAR) 50-12.5 MG tablet Take 1 tablet by mouth daily. 90 tablet 1  . omeprazole (PRILOSEC) 20 MG capsule Take 1 capsule (20 mg total) by mouth daily. 90 capsule 1  . simvastatin (ZOCOR) 40 MG tablet Take  1 tablet (40 mg total) by mouth at bedtime. 90 tablet 1  . tamsulosin (FLOMAX) 0.4 MG CAPS capsule Take 1 capsule (0.4 mg total) by mouth daily. 90 capsule 1  . aspirin 81 MG tablet Take 1 tablet (81 mg total) by mouth daily. 30 tablet 11  . carbamide peroxide (DEBROX) 6.5 % otic solution Place 5 drops into both ears 2 (two) times daily. 15 mL 0   No facility-administered medications prior to visit.     Review of Systems  Constitutional: Negative for chills, fever, malaise/fatigue and weight loss.  HENT: Negative for ear discharge, ear pain and sore throat.   Eyes: Negative for blurred vision.  Respiratory: Positive for shortness of breath. Negative for cough, sputum production and wheezing.   Cardiovascular: Negative  for chest pain, palpitations, orthopnea, leg swelling and PND.  Gastrointestinal: Negative for abdominal pain, blood in stool, constipation, diarrhea, heartburn, melena and nausea.  Genitourinary: Negative for dysuria, frequency, hematuria and urgency.  Musculoskeletal: Negative for back pain, joint pain, myalgias and neck pain.  Skin: Negative for rash.  Neurological: Negative for dizziness, tingling, sensory change, focal weakness and headaches.  Endo/Heme/Allergies: Negative for environmental allergies and polydipsia. Does not bruise/bleed easily.  Psychiatric/Behavioral: Negative for depression and suicidal ideas. The patient is not nervous/anxious and does not have insomnia.      Objective  Vitals:   10/31/17 1100  Weight: 153 lb (69.4 kg)  Height: 5\' 10"  (1.778 m)    Physical Exam  Constitutional: He is oriented to person, place, and time.  HENT:  Head: Normocephalic.  Right Ear: External ear normal.  Left Ear: External ear normal.  Nose: Nose normal.  Mouth/Throat: Oropharynx is clear and moist.  Eyes: Pupils are equal, round, and reactive to light. Conjunctivae and EOM are normal. Right eye exhibits no discharge. Left eye exhibits no discharge. No scleral icterus.  Neck: Normal range of motion. Neck supple. No JVD present. No tracheal deviation present. No thyromegaly present.  Cardiovascular: Normal rate, regular rhythm, S1 normal, S2 normal, normal heart sounds and intact distal pulses. Exam reveals no gallop, no S3, no S4 and no friction rub.  No murmur heard.  No systolic murmur is present.  No diastolic murmur is present. Pulmonary/Chest: Breath sounds normal. No respiratory distress. He has no wheezes. He has no rales.  Abdominal: Soft. Bowel sounds are normal. He exhibits no mass. There is no hepatosplenomegaly. There is no tenderness. There is no rebound, no guarding and no CVA tenderness.  Musculoskeletal: Normal range of motion. He exhibits no edema or tenderness.   Lymphadenopathy:    He has no cervical adenopathy.  Neurological: He is alert and oriented to person, place, and time. He has normal strength and normal reflexes. No cranial nerve deficit.  Skin: Skin is warm. No rash noted.  Nursing note and vitals reviewed.     Assessment & Plan  Problem List Items Addressed This Visit      Cardiovascular and Mediastinum   Essential hypertension    Elevated systolic BP with low normal diastolic reading. Patient encouraged to avoid salt and fatback.      Relevant Medications   aspirin EC 81 MG tablet    Other Visit Diagnoses    Dizziness    -  Primary   Nonvertigo dizziness with no change in medications or activities.   Relevant Orders   EKG 12-Lead (Completed)   Ambulatory referral to Cardiology   Bradycardia       Marked sinus bradycardia (  rate 38) and referral made to cardiology Dr Clayborn Bigness.   Relevant Orders   EKG 12-Lead (Completed)   Ambulatory referral to Cardiology      No orders of the defined types were placed in this encounter.     Dr. Mac[facility]  10/31/17

## 2017-11-02 ENCOUNTER — Other Ambulatory Visit: Payer: Self-pay

## 2017-11-02 DIAGNOSIS — R9431 Abnormal electrocardiogram [ECG] [EKG]: Secondary | ICD-10-CM | POA: Insufficient documentation

## 2017-11-02 DIAGNOSIS — R001 Bradycardia, unspecified: Secondary | ICD-10-CM | POA: Diagnosis not present

## 2017-11-02 DIAGNOSIS — R42 Dizziness and giddiness: Secondary | ICD-10-CM | POA: Diagnosis not present

## 2017-11-02 DIAGNOSIS — R0602 Shortness of breath: Secondary | ICD-10-CM | POA: Insufficient documentation

## 2017-11-09 DIAGNOSIS — R001 Bradycardia, unspecified: Secondary | ICD-10-CM | POA: Diagnosis not present

## 2017-11-09 DIAGNOSIS — R42 Dizziness and giddiness: Secondary | ICD-10-CM | POA: Diagnosis not present

## 2017-11-10 ENCOUNTER — Ambulatory Visit
Admission: EM | Admit: 2017-11-10 | Discharge: 2017-11-10 | Disposition: A | Discharge status: Home / Self Care | Payer: Medicare HMO | Attending: Family Medicine | Admitting: Family Medicine

## 2017-11-10 ENCOUNTER — Other Ambulatory Visit: Payer: Self-pay

## 2017-11-10 DIAGNOSIS — I10 Essential (primary) hypertension: Secondary | ICD-10-CM | POA: Diagnosis not present

## 2017-11-10 DIAGNOSIS — Z79899 Other long term (current) drug therapy: Secondary | ICD-10-CM | POA: Insufficient documentation

## 2017-11-10 DIAGNOSIS — R001 Bradycardia, unspecified: Secondary | ICD-10-CM | POA: Insufficient documentation

## 2017-11-10 DIAGNOSIS — I493 Ventricular premature depolarization: Secondary | ICD-10-CM | POA: Insufficient documentation

## 2017-11-10 DIAGNOSIS — Z7982 Long term (current) use of aspirin: Secondary | ICD-10-CM | POA: Diagnosis not present

## 2017-11-10 DIAGNOSIS — N400 Enlarged prostate without lower urinary tract symptoms: Secondary | ICD-10-CM | POA: Diagnosis not present

## 2017-11-10 DIAGNOSIS — R42 Dizziness and giddiness: Secondary | ICD-10-CM | POA: Diagnosis not present

## 2017-11-10 DIAGNOSIS — E7800 Pure hypercholesterolemia, unspecified: Secondary | ICD-10-CM | POA: Insufficient documentation

## 2017-11-10 DIAGNOSIS — R079 Chest pain, unspecified: Secondary | ICD-10-CM | POA: Diagnosis not present

## 2017-11-10 LAB — COMPREHENSIVE METABOLIC PANEL
ALBUMIN: 3.9 g/dL (ref 3.5–5.0)
ALT: 14 U/L — AB (ref 17–63)
AST: 23 U/L (ref 15–41)
Alkaline Phosphatase: 45 U/L (ref 38–126)
Anion gap: 10 (ref 5–15)
BILIRUBIN TOTAL: 0.7 mg/dL (ref 0.3–1.2)
BUN: 31 mg/dL — AB (ref 6–20)
CO2: 25 mmol/L (ref 22–32)
CREATININE: 1.18 mg/dL (ref 0.61–1.24)
Calcium: 8.6 mg/dL — ABNORMAL LOW (ref 8.9–10.3)
Chloride: 103 mmol/L (ref 101–111)
GFR calc Af Amer: 60 mL/min — ABNORMAL LOW (ref >60)
GFR, EST NON AFRICAN AMERICAN: 52 mL/min — AB (ref >60)
GLUCOSE: 117 mg/dL — AB (ref 65–99)
POTASSIUM: 3.9 mmol/L (ref 3.5–5.1)
Sodium: 138 mmol/L (ref 135–145)
TOTAL PROTEIN: 7.6 g/dL (ref 6.5–8.1)

## 2017-11-10 LAB — CBC WITH DIFFERENTIAL/PLATELET
BASOS ABS: 0 10*3/uL (ref 0–0.1)
BASOS PCT: 1 %
Eosinophils Absolute: 0.2 10*3/uL (ref 0–0.7)
Eosinophils Relative: 5 %
HEMATOCRIT: 38.3 % — AB (ref 40.0–52.0)
HEMOGLOBIN: 13 g/dL (ref 13.0–18.0)
LYMPHS PCT: 37 %
Lymphs Abs: 1.5 10*3/uL (ref 1.0–3.6)
MCH: 30.3 pg (ref 26.0–34.0)
MCHC: 33.9 g/dL (ref 32.0–36.0)
MCV: 89.3 fL (ref 80.0–100.0)
MONO ABS: 0.4 10*3/uL (ref 0.2–1.0)
Monocytes Relative: 11 %
NEUTROS ABS: 1.9 10*3/uL (ref 1.4–6.5)
NEUTROS PCT: 46 %
Platelets: 215 10*3/uL (ref 150–440)
RBC: 4.29 MIL/uL — AB (ref 4.40–5.90)
RDW: 13.7 % (ref 11.5–14.5)
WBC: 4.2 10*3/uL (ref 3.8–10.6)

## 2017-11-10 MED ORDER — FLUTICASONE PROPIONATE 50 MCG/ACT NA SUSP: 2.0000 | Freq: Every day | NASAL | 0 refills | Status: DC

## 2017-11-10 NOTE — Discharge Instructions (Signed)
Labs unremarkble.  EKG was unchanged.  Use the flonase for his congestion.  Be sure to follow closely with cardiology.  Take care  Dr. Lacinda Axon

## 2017-11-10 NOTE — ED Provider Notes (Signed)
[facility]    CSN: [id number] Arrival date & time: 11/10/17  1311  History   Chief Complaint Chief Complaint  Patient presents with  . Dizziness   HPI  84 year old Eric [name] presents with persistent dizziness.  Patient has had recent ongoing dizziness.  He has now seen his primary as well as cardiology.  His primary examined him and found to be bradycardic. He was sent to cardiology and has had a holter.  Per the EMR, Holter revealed bradycardia with a minimum of 31, and average of 52.  Occasional PVC and PAC.  No advanced heart block or malignant tachycardia.   Patient continues to report dizziness.  He states that he is "swimmy headed".  It is hard for me to get the patient to describe what he means.  He states that it does not seem to be worse with activity.  In fact, he states that it is better activity.  Happens more at rest.  Patient is concerned that he has a sinus infection.  He states that he has some nasal congestion.  No reports of purulent nasal discharge or blowing discolored mucus from the nose.  No reports of sinus pain or pressure.  He does note some "congestion".  No other associated symptoms.  No other complaints.   Past Medical History:  Diagnosis Date  . High cholesterol   . Hypertension   . Prostate disorder    Patient Active Problem List   Diagnosis Date Noted  . Essential hypertension 10/22/2015  . Hyperlipidemia 10/22/2015  . Benign prostatic hyperplasia 10/22/2015   Past Surgical History:  Procedure Laterality Date  . HERNIA REPAIR      Home Medications    Prior to Admission medications   Medication Sig Start Date End Date Taking? Authorizing Provider  aspirin EC 81 MG tablet Take 81 mg by mouth daily.   Yes [provider]  losartan-hydrochlorothiazide (HYZAAR) 50-12.5 MG tablet Take 1 tablet by mouth daily. 08/24/17  Yes Juline Patch, MD  omeprazole (PRILOSEC) 20 MG capsule Take 1 capsule (20 mg total) by mouth daily. 08/24/17   Yes Juline Patch, MD  simvastatin (ZOCOR) 40 MG tablet Take 1 tablet (40 mg total) by mouth at bedtime. 08/24/17  Yes Juline Patch, MD  tamsulosin (FLOMAX) 0.4 MG CAPS capsule Take 1 capsule (0.4 mg total) by mouth daily. 08/24/17  Yes Juline Patch, MD  fluticasone (FLONASE) 50 MCG/ACT nasal spray Place 2 sprays into both nostrils daily. 11/10/17   Coral Spikes, DO   Social History Social History   Tobacco Use  . Smoking status: Never Smoker  . Smokeless tobacco: Never Used  Substance Use Topics  . Alcohol use: No  . Drug use: No     Allergies   Patient has no known allergies.   Review of Systems Review of Systems  HENT: Positive for congestion.   Neurological: Positive for dizziness.   Physical Exam Triage Vital Signs ED Triage Vitals  Enc Vitals Group     BP 11/10/17 1344 (!) 161/61     Pulse Rate 11/10/17 1344 (!) 59     Resp 11/10/17 1344 18     Temp 11/10/17 1344 98.2 F (Eric.8 C)     Temp Source 11/10/17 1344 Oral     SpO2 11/10/17 1344 100 %     Weight 11/10/17 1326 153 lb (69.4 kg)     Height 11/10/17 1326 5\' 10"  (1.778 m)     Head Circumference --  Peak Flow --      Pain Score 11/10/17 1325 0     Pain Loc --      Pain Edu? --      Excl. in [place]? --    Orthostatic VS for the past 24 hrs:  BP- Lying Pulse- Lying BP- Sitting Pulse- Sitting BP- Standing at 0 minutes Pulse- Standing at 0 minutes  11/10/17 1346 171/65 (!) 49 154/59 55 164/64 57    Updated Vital Signs BP (!) 161/61 (BP Location: Left Arm)   Pulse (!) 59   Temp 98.2 F (Eric.8 C) (Oral)   Resp 18   Ht 5\' 10"  (1.778 m)   Wt 153 lb (69.4 kg)   SpO2 100%   BMI 21.95 kg/m   Physical Exam  Constitutional: He appears well-developed. No distress.  HENT:  Head: Normocephalic and atraumatic.  Hard of hearing.  Eyes: Pupils are equal, round, and reactive to light. Conjunctivae and EOM are normal. Right eye exhibits no discharge. Left eye exhibits no discharge.  Cardiovascular: Regular  rhythm.  No murmur heard. Bradycardia.  Pulmonary/Chest: Effort normal and breath sounds normal. He has no wheezes. He has no rales.  Neurological: He is alert.  Psychiatric: He has a normal mood and affect. His behavior is normal.  Nursing note and vitals reviewed.  UC Treatments / Results  Labs (all labs ordered are listed, but only abnormal results are displayed) Labs Reviewed  CBC WITH DIFFERENTIAL/PLATELET - Abnormal; Notable for the following components:      Result Value   RBC 4.29 (*)    HCT 38.3 (*)    All other components within normal limits  COMPREHENSIVE METABOLIC PANEL - Abnormal; Notable for the following components:   Glucose, Bld 117 (*)    BUN 31 (*)    Calcium 8.6 (*)    ALT 14 (*)    GFR calc non Af Amer 52 (*)    GFR calc Af Amer 60 (*)    All other components within normal limits    EKG Interpretation: Sinus bradycardia at the rate of 47.  T wave inversions in leads II, 3, aVF.  LVH.  EKG unchanged from prior.  Radiology No results found.  Procedures Procedures (including critical care time)  Medications Ordered in UC Medications - No data to display  Initial Impression / Assessment and Plan / UC Course  I have reviewed the triage vital signs and the nursing notes.  Pertinent labs & imaging results that were available during my care of the patient were reviewed by me and considered in my medical decision making (see chart for details).    84 year old Eric [name] presents with dizziness.  Patient has an ongoing evaluation from cardiology.  Orthostatics negative today.  EKG unchanged today.  Labs unremarkable.  Given Flonase for nasal congestion.  Supportive care.  Advised to follow-up with cardiology as directed.  Final Clinical Impressions(s) / UC Diagnoses   Final diagnoses:  Dizziness  Bradycardia     Discharge Instructions     Labs unremarkble.  EKG was unchanged.  Use the flonase for his congestion.  Be sure to follow closely with  cardiology.  Take care  Dr. Lacinda Axon     ED Prescriptions    Medication Sig Dispense Auth. Provider   fluticasone (FLONASE) 50 MCG/ACT nasal spray  (Status: Discontinued) Place 2 sprays into both nostrils daily. 16 g Jaeveon Ashland G, DO   fluticasone (FLONASE) 50 MCG/ACT nasal spray Place 2 sprays into both nostrils daily.  16 g Coral Spikes, DO     Controlled Substance Prescriptions [place] Controlled Substance Registry consulted? Not Applicable   Coral Spikes, DO 11/10/17 1426

## 2017-11-10 NOTE — ED Triage Notes (Addendum)
Patient was seen in [facility] cardiology, follow up schedule next week but has dizziness from past 3 days also has sinus infection from past month.

## 2017-11-30 ENCOUNTER — Other Ambulatory Visit: Payer: Self-pay

## 2017-11-30 DIAGNOSIS — H40153 Residual stage of open-angle glaucoma, bilateral: Secondary | ICD-10-CM | POA: Diagnosis not present

## 2017-12-08 DIAGNOSIS — R001 Bradycardia, unspecified: Secondary | ICD-10-CM | POA: Diagnosis not present

## 2017-12-08 DIAGNOSIS — R42 Dizziness and giddiness: Secondary | ICD-10-CM | POA: Diagnosis not present

## 2017-12-08 DIAGNOSIS — R9431 Abnormal electrocardiogram [ECG] [EKG]: Secondary | ICD-10-CM | POA: Diagnosis not present

## 2017-12-20 ENCOUNTER — Encounter
Admission: RE | Admit: 2017-12-20 | Discharge: 2017-12-20 | Disposition: A | Discharge status: Home / Self Care | Payer: Medicare HMO | Source: Ambulatory Visit | Attending: Cardiology | Admitting: Cardiology

## 2017-12-20 ENCOUNTER — Other Ambulatory Visit: Payer: Self-pay

## 2017-12-20 ENCOUNTER — Ambulatory Visit
Admission: RE | Admit: 2017-12-20 | Discharge: 2017-12-20 | Disposition: A | Discharge status: Home / Self Care | Payer: Medicare HMO | Source: Ambulatory Visit | Attending: Cardiology | Admitting: Cardiology

## 2017-12-20 DIAGNOSIS — Z01812 Encounter for preprocedural laboratory examination: Secondary | ICD-10-CM | POA: Diagnosis not present

## 2017-12-20 DIAGNOSIS — J984 Other disorders of lung: Secondary | ICD-10-CM | POA: Diagnosis not present

## 2017-12-20 DIAGNOSIS — Z01818 Encounter for other preprocedural examination: Secondary | ICD-10-CM | POA: Diagnosis not present

## 2017-12-20 HISTORY — DX: Unspecified osteoarthritis, unspecified site: M19.90

## 2017-12-20 HISTORY — DX: Unspecified hearing loss, unspecified ear: H91.90

## 2017-12-20 HISTORY — DX: Cardiac arrhythmia, unspecified: I49.9

## 2017-12-20 HISTORY — DX: Personal history of urinary calculi: Z87.442

## 2017-12-20 HISTORY — DX: Gastro-esophageal reflux disease without esophagitis: K21.9

## 2017-12-20 LAB — PROTIME-INR
INR: 1.02
Prothrombin Time: 13.3 seconds (ref 11.4–15.2)

## 2017-12-20 LAB — SURGICAL PCR SCREEN
MRSA, PCR: NEGATIVE
Staphylococcus aureus: POSITIVE — AB

## 2017-12-20 LAB — BASIC METABOLIC PANEL
ANION GAP: 5 (ref 5–15)
BUN: 23 mg/dL (ref 8–23)
CALCIUM: 8.8 mg/dL — AB (ref 8.9–10.3)
CO2: 32 mmol/L (ref 22–32)
Chloride: 107 mmol/L (ref 98–111)
Creatinine, Ser: 1.17 mg/dL (ref 0.61–1.24)
GFR calc Af Amer: >60 mL/min (ref >60)
GFR calc non Af Amer: 52 mL/min — ABNORMAL LOW (ref >60)
GLUCOSE: 101 mg/dL — AB (ref 70–99)
Potassium: 3.7 mmol/L (ref 3.5–5.1)
Sodium: 144 mmol/L (ref 135–145)

## 2017-12-20 LAB — CBC WITH DIFFERENTIAL/PLATELET
BASOS PCT: 1 %
Basophils Absolute: 0 10*3/uL (ref 0–0.1)
Eosinophils Absolute: 0.1 10*3/uL (ref 0–0.7)
Eosinophils Relative: 4 %
HEMATOCRIT: 35.9 % — AB (ref 40.0–52.0)
Hemoglobin: 12.6 g/dL — ABNORMAL LOW (ref 13.0–18.0)
Lymphocytes Relative: 38 %
Lymphs Abs: 1.3 10*3/uL (ref 1.0–3.6)
MCH: 31.2 pg (ref 26.0–34.0)
MCHC: 35.1 g/dL (ref 32.0–36.0)
MCV: 89 fL (ref 80.0–100.0)
MONO ABS: 0.4 10*3/uL (ref 0.2–1.0)
MONOS PCT: 13 %
NEUTROS ABS: 1.5 10*3/uL (ref 1.4–6.5)
Neutrophils Relative %: 44 %
Platelets: 219 10*3/uL (ref 150–440)
RBC: 4.04 MIL/uL — ABNORMAL LOW (ref 4.40–5.90)
RDW: 13.5 % (ref 11.5–14.5)
WBC: 3.5 10*3/uL — ABNORMAL LOW (ref 3.8–10.6)

## 2017-12-20 LAB — APTT: aPTT: 28 seconds (ref 24–36)

## 2017-12-20 NOTE — Patient Instructions (Signed)
Your procedure is scheduled on: Tuesday, December 27, 2017  Report to [place]    DO NOT STOP ON THE FIRST FLOOR TO REGISTER  To find out your arrival time please call [phone] between 1PM - 3PM on Monday, December 26, 2017  Remember: Instructions that are not followed completely may result in serious medical risk,  up to and including death, or upon the discretion of your surgeon and anesthesiologist your  surgery may need to be rescheduled.     _X__ 1. Do not eat food after midnight the night before your procedure.                 No gum chewing or hard candies.ABSOLUTELY NOTHING SOLID IN YOUR MOUTH AFTER MIDNIGHT                  You may drink clear liquids up to 2 hours before you are scheduled to arrive for your surgery-                   DO not drink clear liquids within 2 hours of the start of your surgery.                   Clear Liquids include:  water, apple juice without pulp, clear carbohydrate                 drink such as Clearfast of Gatorade, Black Coffee or Tea (Do not add                 anything to coffee or tea). NO DAIRY PRODUCTS, MILK , LEMON, HONEY, SOUP OR BROTH  __X__2.  On the morning of surgery brush your teeth with toothpaste and water,                     You may rinse your mouth with mouthwash if you wish.                       Do not swallow any toothpaste of mouthwash.     _X__ 3.  No Alcohol for 24 hours before or after surgery.   _X__ 4.  Do Not Smoke or use e-cigarettes For 24 Hours Prior to Your Surgery.                 Do not use any chewable tobacco products for at least 6 hours prior to                 surgery.  ____  5.  Bring all medications with you on the day of surgery if instructed.   _X___  6.  Notify your doctor if there is any change in your medical condition      (cold, fever, infections).     Do not wear jewelry, make-up, hairpins, clips or nail polish. Do not wear lotions, powders,  or perfumes. You may NOT wear deodorant. Do not shave 48 hours prior to surgery. Men may shave face and neck. Do not bring valuables to the hospital.    [facility] is not responsible for any belongings or valuables.  Contacts, dentures or bridgework may not be worn into surgery. Leave your suitcase in the car. After surgery it may be brought to your room. For patients admitted to the hospital, discharge time is determined by your treatment team.   Patients discharged the day of surgery will not be allowed  to drive home.   Please read over the following fact sheets that you were given:   [place]   __X__ Take these medicines the morning of surgery with A SIP OF WATER:    1. OMEPRAZOLE  2. FLOMAX  3.  FLONASE  4.  5.  6.  ____ Fleet Enema (as directed)   _X___ Use CHG Soap as directed  __X_ Stop ASPIRIN AS OF TODAY  __X__ Stop Anti-inflammatories AS OF TODAY             YOU MAY TAKE TYLENOL AT ANY TIME PRIOR TO SURGERY   ____ Stop supplements until after surgery.    ____ Bring C-Pap to the hospital.   CONTINUE TAKING LOSARTAN/HYDROCHLOROTHIAZIDE EACH MORNING, BUT NOT ON THE MORNING OF SURGERY  TAKE ALL NIGHT TIME MEDICATIONS AS USUAL.  HAVE STOOL SOFTENERS AT HOME TO USE ONCE DISCHARGED.  PLEASE BRING MEDICAL DIRECTIVE / POWER OF ATTORNEY PAPERS IF YOU FIND THEM SO WE CAN PUT IN COMPUTER.

## 2017-12-21 NOTE — Pre-Procedure Instructions (Signed)
Positive staph aureus results and CXR results sent to Dr. Saralyn Pilar for review.

## 2017-12-26 MED ORDER — CEFAZOLIN SODIUM-DEXTROSE 1-4 GM/50ML-% IV SOLN
1.0000 g | Freq: Once | INTRAVENOUS | Status: AC
  Administered 2017-12-27: 1 g via INTRAVENOUS

## 2017-12-27 ENCOUNTER — Encounter
Admission: RE | Disposition: A | Discharge status: Home / Self Care | Payer: Self-pay | Source: Ambulatory Visit | Attending: Cardiology

## 2017-12-27 ENCOUNTER — Other Ambulatory Visit: Payer: Self-pay

## 2017-12-27 ENCOUNTER — Encounter: Payer: Self-pay | Admitting: *Deleted

## 2017-12-27 ENCOUNTER — Ambulatory Visit: Payer: Medicare HMO

## 2017-12-27 ENCOUNTER — Observation Stay: Payer: Medicare HMO

## 2017-12-27 ENCOUNTER — Ambulatory Visit: Payer: Medicare HMO | Admitting: Registered Nurse

## 2017-12-27 ENCOUNTER — Observation Stay
Admission: RE | Admit: 2017-12-27 | Discharge: 2017-12-28 | Disposition: A | Discharge status: Home / Self Care | Payer: Medicare HMO | Source: Ambulatory Visit | Attending: Cardiology | Admitting: Cardiology

## 2017-12-27 DIAGNOSIS — R42 Dizziness and giddiness: Secondary | ICD-10-CM | POA: Insufficient documentation

## 2017-12-27 DIAGNOSIS — Z79899 Other long term (current) drug therapy: Secondary | ICD-10-CM | POA: Diagnosis not present

## 2017-12-27 DIAGNOSIS — F329 Major depressive disorder, single episode, unspecified: Secondary | ICD-10-CM | POA: Diagnosis not present

## 2017-12-27 DIAGNOSIS — I700 Atherosclerosis of aorta: Secondary | ICD-10-CM | POA: Diagnosis not present

## 2017-12-27 DIAGNOSIS — I495 Sick sinus syndrome: Secondary | ICD-10-CM | POA: Diagnosis present

## 2017-12-27 DIAGNOSIS — I10 Essential (primary) hypertension: Secondary | ICD-10-CM | POA: Diagnosis not present

## 2017-12-27 DIAGNOSIS — R0602 Shortness of breath: Secondary | ICD-10-CM | POA: Diagnosis not present

## 2017-12-27 DIAGNOSIS — Z950 Presence of cardiac pacemaker: Secondary | ICD-10-CM

## 2017-12-27 DIAGNOSIS — I051 Rheumatic mitral insufficiency: Secondary | ICD-10-CM | POA: Insufficient documentation

## 2017-12-27 DIAGNOSIS — Z9889 Other specified postprocedural states: Secondary | ICD-10-CM | POA: Diagnosis not present

## 2017-12-27 DIAGNOSIS — E785 Hyperlipidemia, unspecified: Secondary | ICD-10-CM | POA: Insufficient documentation

## 2017-12-27 DIAGNOSIS — K219 Gastro-esophageal reflux disease without esophagitis: Secondary | ICD-10-CM | POA: Diagnosis not present

## 2017-12-27 DIAGNOSIS — Z7982 Long term (current) use of aspirin: Secondary | ICD-10-CM | POA: Insufficient documentation

## 2017-12-27 DIAGNOSIS — R001 Bradycardia, unspecified: Principal | ICD-10-CM | POA: Insufficient documentation

## 2017-12-27 DIAGNOSIS — E7800 Pure hypercholesterolemia, unspecified: Secondary | ICD-10-CM | POA: Diagnosis not present

## 2017-12-27 HISTORY — PX: PACEMAKER INSERTION: SHX728

## 2017-12-27 SURGERY — INSERTION, CARDIAC PACEMAKER
Anesthesia: General | Site: Chest | Laterality: Left | Wound class: Clean

## 2017-12-27 MED ORDER — TAMSULOSIN HCL 0.4 MG PO CAPS
0.4000 mg | ORAL_CAPSULE | Freq: Every day | ORAL | Status: DC
  Administered 2017-12-27 – 2017-12-28 (×2): 0.4 mg via ORAL
  Filled 2017-12-27 (×2): qty 1

## 2017-12-27 MED ORDER — IOPAMIDOL (ISOVUE-300) INJECTION 61%
INTRAVENOUS | Status: DC | PRN
  Administered 2017-12-27: 20 mL via INTRAVENOUS

## 2017-12-27 MED ORDER — PROPOFOL 10 MG/ML IV BOLUS
INTRAVENOUS | Status: AC
  Filled 2017-12-27: qty 20

## 2017-12-27 MED ORDER — CEFAZOLIN SODIUM-DEXTROSE 1-4 GM/50ML-% IV SOLN
INTRAVENOUS | Status: AC
  Filled 2017-12-27: qty 50

## 2017-12-27 MED ORDER — FENTANYL CITRATE (PF) 100 MCG/2ML IJ SOLN: 25.0000 ug | INTRAMUSCULAR | Status: DC | PRN

## 2017-12-27 MED ORDER — SIMVASTATIN 40 MG PO TABS
40.0000 mg | ORAL_TABLET | Freq: Every day | ORAL | Status: DC
  Administered 2017-12-27: 40 mg via ORAL
  Filled 2017-12-27 (×2): qty 1
  Filled 2017-12-27: qty 2

## 2017-12-27 MED ORDER — PROPOFOL 10 MG/ML IV BOLUS
INTRAVENOUS | Status: DC | PRN
  Administered 2017-12-27 (×2): 20 mg via INTRAVENOUS

## 2017-12-27 MED ORDER — CEFAZOLIN SODIUM-DEXTROSE 1-4 GM/50ML-% IV SOLN
1.0000 g | Freq: Four times a day (QID) | INTRAVENOUS | Status: AC
  Administered 2017-12-27 – 2017-12-28 (×3): 1 g via INTRAVENOUS
  Filled 2017-12-27 (×3): qty 50

## 2017-12-27 MED ORDER — SODIUM CHLORIDE 0.9 % IJ SOLN
INTRAMUSCULAR | Status: AC
  Filled 2017-12-27: qty 50

## 2017-12-27 MED ORDER — FLUOXETINE HCL 20 MG PO CAPS
20.0000 mg | ORAL_CAPSULE | Freq: Every day | ORAL | Status: DC
  Administered 2017-12-27 – 2017-12-28 (×2): 20 mg via ORAL
  Filled 2017-12-27 (×2): qty 1

## 2017-12-27 MED ORDER — FENTANYL CITRATE (PF) 100 MCG/2ML IJ SOLN
INTRAMUSCULAR | Status: AC
  Filled 2017-12-27: qty 2

## 2017-12-27 MED ORDER — SODIUM CHLORIDE 0.9 % IV SOLN
INTRAVENOUS | Status: DC | PRN
  Administered 2017-12-27: 120 mL

## 2017-12-27 MED ORDER — SODIUM CHLORIDE 0.9% FLUSH
3.0000 mL | INTRAVENOUS | Status: DC | PRN
  Administered 2017-12-27 – 2017-12-28 (×2): 3 mL via INTRAVENOUS
  Filled 2017-12-27 (×2): qty 3

## 2017-12-27 MED ORDER — FENTANYL CITRATE (PF) 100 MCG/2ML IJ SOLN
INTRAMUSCULAR | Status: DC | PRN
  Administered 2017-12-27 (×2): 12.5 ug via INTRAVENOUS

## 2017-12-27 MED ORDER — SODIUM CHLORIDE 0.9 % IV SOLN
Freq: Once | INTRAVENOUS | Status: DC
  Filled 2017-12-27: qty 2

## 2017-12-27 MED ORDER — PROPOFOL 500 MG/50ML IV EMUL
INTRAVENOUS | Status: DC | PRN
  Administered 2017-12-27: 35 ug/kg/min via INTRAVENOUS

## 2017-12-27 MED ORDER — LACTATED RINGERS IV SOLN: INTRAVENOUS | Status: DC

## 2017-12-27 MED ORDER — LACTATED RINGERS IV SOLN
INTRAVENOUS | Status: DC
  Administered 2017-12-27: 12:00:00 via INTRAVENOUS

## 2017-12-27 MED ORDER — ONDANSETRON HCL 4 MG/2ML IJ SOLN: 4.0000 mg | Freq: Four times a day (QID) | INTRAMUSCULAR | Status: DC | PRN

## 2017-12-27 MED ORDER — GENTAMICIN SULFATE 40 MG/ML IJ SOLN
INTRAMUSCULAR | Status: AC
  Filled 2017-12-27: qty 2

## 2017-12-27 MED ORDER — LOSARTAN POTASSIUM 50 MG PO TABS
50.0000 mg | ORAL_TABLET | Freq: Every day | ORAL | Status: DC
  Administered 2017-12-27 – 2017-12-28 (×2): 50 mg via ORAL
  Filled 2017-12-27 (×2): qty 1

## 2017-12-27 MED ORDER — LIDOCAINE 1 % OPTIME INJ - NO CHARGE
INTRAMUSCULAR | Status: DC | PRN
  Administered 2017-12-27: 30 mL

## 2017-12-27 MED ORDER — SODIUM CHLORIDE 0.9% FLUSH
3.0000 mL | Freq: Two times a day (BID) | INTRAVENOUS | Status: DC
  Administered 2017-12-28: 3 mL via INTRAVENOUS

## 2017-12-27 MED ORDER — AMLODIPINE BESYLATE 5 MG PO TABS
5.0000 mg | ORAL_TABLET | Freq: Every day | ORAL | Status: DC
  Administered 2017-12-27 – 2017-12-28 (×2): 5 mg via ORAL
  Filled 2017-12-27 (×2): qty 1

## 2017-12-27 MED ORDER — ACETAMINOPHEN 325 MG PO TABS
325.0000 mg | ORAL_TABLET | ORAL | Status: DC | PRN
  Administered 2017-12-27 (×2): 650 mg via ORAL
  Filled 2017-12-27 (×2): qty 2

## 2017-12-27 MED ORDER — LIDOCAINE HCL (CARDIAC) PF 100 MG/5ML IV SOSY
PREFILLED_SYRINGE | INTRAVENOUS | Status: DC | PRN
  Administered 2017-12-27: 60 mg via INTRAVENOUS

## 2017-12-27 MED ORDER — DEXAMETHASONE SODIUM PHOSPHATE 10 MG/ML IJ SOLN
INTRAMUSCULAR | Status: DC | PRN
  Administered 2017-12-27: 6 mg via INTRAVENOUS

## 2017-12-27 MED ORDER — ONDANSETRON HCL 4 MG/2ML IJ SOLN
INTRAMUSCULAR | Status: DC | PRN
  Administered 2017-12-27: 4 mg via INTRAVENOUS

## 2017-12-27 MED ORDER — ONDANSETRON HCL 4 MG/2ML IJ SOLN: 4.0000 mg | Freq: Once | INTRAMUSCULAR | Status: DC | PRN

## 2017-12-27 SURGICAL SUPPLY — 37 items
BAG DECANTER FOR FLEXI CONT (MISCELLANEOUS) ×3 IMPLANT
BRUSH SCRUB EZ  4% CHG (MISCELLANEOUS) ×2
BRUSH SCRUB EZ 4% CHG (MISCELLANEOUS) ×1 IMPLANT
CABLE SURG 12 DISP A/V CHANNEL (MISCELLANEOUS) ×3 IMPLANT
CANISTER SUCT 1200ML W/VALVE (MISCELLANEOUS) ×3 IMPLANT
CHLORAPREP W/TINT 26ML (MISCELLANEOUS) ×3 IMPLANT
COVER LIGHT HANDLE STERIS (MISCELLANEOUS) ×6 IMPLANT
COVER MAYO STAND STRL (DRAPES) ×3 IMPLANT
DRAPE C-ARM XRAY 36X54 (DRAPES) ×3 IMPLANT
DRSG TEGADERM 4X4.75 (GAUZE/BANDAGES/DRESSINGS) ×3 IMPLANT
DRSG TELFA 4X3 1S NADH ST (GAUZE/BANDAGES/DRESSINGS) ×3 IMPLANT
ELECT REM PT RETURN 9FT ADLT (ELECTROSURGICAL) ×3
ELECTRODE REM PT RTRN 9FT ADLT (ELECTROSURGICAL) ×1 IMPLANT
GLOVE BIO SURGEON STRL SZ7.5 (GLOVE) ×3 IMPLANT
GLOVE BIO SURGEON STRL SZ8 (GLOVE) ×3 IMPLANT
GOWN STRL REUS W/ TWL LRG LVL3 (GOWN DISPOSABLE) ×2 IMPLANT
GOWN STRL REUS W/ TWL XL LVL3 (GOWN DISPOSABLE) ×1 IMPLANT
GOWN STRL REUS W/TWL LRG LVL3 (GOWN DISPOSABLE) ×4
GOWN STRL REUS W/TWL XL LVL3 (GOWN DISPOSABLE) ×2
IMMOBILIZER SHDR MD LX WHT (SOFTGOODS) ×3 IMPLANT
IMMOBILIZER SHDR XL LX WHT (SOFTGOODS) IMPLANT
INTRO PACEMAKR LEAD 9FR 13CM (INTRODUCER) ×3
INTRO PACEMKR SHEATH II 7FR (MISCELLANEOUS) ×3
INTRODUCER PACEMKR LD 9FR 13CM (INTRODUCER) ×1 IMPLANT
INTRODUCER PACEMKR SHTH II 7FR (MISCELLANEOUS) ×1 IMPLANT
IPG PACE AZUR XT DR MRI W1DR01 (Pacemaker) ×1 IMPLANT
IV NS 500ML (IV SOLUTION) ×2
IV NS 500ML BAXH (IV SOLUTION) ×1 IMPLANT
KIT TURNOVER KIT A (KITS) ×3 IMPLANT
LABEL OR SOLS (LABEL) ×3 IMPLANT
LEAD CAPSURE NOVUS 5076-52CM (Lead) ×3 IMPLANT
LEAD CAPSURE NOVUS 5076-58CM (Lead) ×3 IMPLANT
MARKER SKIN DUAL TIP RULER LAB (MISCELLANEOUS) ×3 IMPLANT
PACE AZURE XT DR MRI W1DR01 (Pacemaker) ×3 IMPLANT
PACK PACE INSERTION (MISCELLANEOUS) ×3 IMPLANT
PAD ONESTEP ZOLL R SERIES ADT (MISCELLANEOUS) ×3 IMPLANT
SUT SILK 0 SH 30 (SUTURE) ×6 IMPLANT

## 2017-12-27 NOTE — Plan of Care (Signed)
  Problem: Education: Goal: Knowledge of General Education information will improve Description Including pain rating scale, medication(s)/side effects and non-pharmacologic comfort measures Outcome: Progressing   Problem: Clinical Measurements: Goal: Ability to maintain clinical measurements within normal limits will improve Outcome: Progressing Goal: Cardiovascular complication will be avoided Outcome: Progressing   Problem: Activity: Goal: Risk for activity intolerance will decrease Outcome: Progressing   Problem: Pain Managment: Goal: General experience of comfort will improve Outcome: Progressing   Problem: Education: Goal: Knowledge of cardiac device and self-care will improve Outcome: Progressing   Problem: Cardiac: Goal: Ability to achieve and maintain adequate cardiopulmonary perfusion will improve Outcome: Progressing

## 2017-12-27 NOTE — Op Note (Signed)
[facility] Cardiology   12/27/2017                     1:39 PM  PATIENT:  Eric [name]    PRE-OPERATIVE DIAGNOSIS:  symptomatic bradycardia  POST-OPERATIVE DIAGNOSIS:  Same  PROCEDURE:  INSERTION PACEMAKER-INITIAL DUAL CHAMBER  SURGEON:  Isaias Cowman, MD    ANESTHESIA:     PREOPERATIVE INDICATIONS:  Eric [name] is a  84 y.o. male with a diagnosis of symptomatic bradycardia who failed conservative measures and elected for surgical management.    The risks benefits and alternatives were discussed with the patient preoperatively including but not limited to the risks of infection, bleeding, cardiopulmonary complications, the need for revision surgery, among others, and the patient was willing to proceed.   OPERATIVE PROCEDURE: The patient was brought to the operating room in a fasting state.  The left pectoral region was prepped and draped in usual sterile manner.  Anesthesia was obtained 1% lidocaine locally.  A 6 cm incision was performed to the left pectoral region.  The pacemaker pocket was generated by electrocautery and blunt dissection.  Access was obtained to the left subclavian vein by fine-needle aspiration.  MRI compatible leads were positions right ventricular apical septum ( Medtronic [id number] ) and right atrial appendage ( Medtronic [id number] )  under fluoroscopic guidance.  After proper thresholds were obtained the leads were sutured in place with 0 silk.  The leads were connected to an MRI compatible dual-chamber rate responsive pacemaker generator ( Medtronic RNB [id number] ).  The pacemaker pocket was irrigated with gentamicin solution.  The pacemaker generator was positioned into the pocket and the pocket was closed with 2-0 and 4-0 Vicryl, respectively.  Steri-Strips and a pressure dressing were applied.  Postprocedure interrogation revealed appropriate dual-chamber atrial and ventricular sensing and pacing thresholds.  There were no periprocedural complications.

## 2017-12-27 NOTE — Interval H&P Note (Signed)
History and Physical Interval Note:  12/27/2017 11:46 AM  Eric [name]  has presented today for surgery, with the diagnosis of symptomatic bradycardia  The various methods of treatment have been discussed with the patient and family. After consideration of risks, benefits and other options for treatment, the patient has consented to  Procedure(s): INSERTION PACEMAKER-INITIAL DUAL CHAMBER (Left) as a surgical intervention .  The patient's history has been reviewed, patient examined, no change in status, stable for surgery.  I have reviewed the patient's chart and labs.  Questions were answered to the patient's satisfaction.     Eric [name] [employer]

## 2017-12-27 NOTE — Transfer of Care (Signed)
Immediate Anesthesia Transfer of Care Note  Patient: Eric [name]  Procedure(s) Performed: INSERTION PACEMAKER-INITIAL DUAL CHAMBER (Left Chest)  Patient Location: PACU  Anesthesia Type:General  Level of Consciousness: drowsy and patient cooperative  Airway & Oxygen Therapy: Patient Spontanous Breathing and Patient connected to nasal cannula oxygen  Post-op Assessment: Report given to RN and Post -op Vital signs reviewed and stable  Post vital signs: Reviewed and stable  Last Vitals:  Vitals Value Taken Time  BP 202/82 12/27/2017  1:41 PM  Temp 36.7 C 12/27/2017  1:40 PM  Pulse 59 12/27/2017  1:42 PM  Resp 14 12/27/2017  1:42 PM  SpO2 100 % 12/27/2017  1:42 PM  Vitals shown include unvalidated device data.  Last Pain:  Vitals:   12/27/17 1052  TempSrc: Temporal  PainSc: 0-No pain         Complications: No apparent anesthesia complications

## 2017-12-27 NOTE — Progress Notes (Signed)
Dr. Saralyn Pilar aware of BP. Ordered Norvasc 5MG  daily. Dr. Ronelle Nigh notified also. Acknowledged. No new orders received. Walida Cajas E 2:21 PM 12/27/2017

## 2017-12-27 NOTE — Anesthesia Post-op Follow-up Note (Signed)
Anesthesia QCDR form completed.        

## 2017-12-27 NOTE — Anesthesia Preprocedure Evaluation (Signed)
Anesthesia Evaluation  Patient identified by MRN, date of birth, ID band Patient awake    Reviewed: Allergy & Precautions, NPO status , Patient's Chart, lab work & pertinent test results  History of Anesthesia Complications Negative for: history of anesthetic complications  Airway Mallampati: II       Dental   Pulmonary neg sleep apnea, neg COPD,           Cardiovascular hypertension, Pt. on medications (-) Past MI and (-) CHF + dysrhythmias (symptomatic bradycardia) (-) Valvular Problems/Murmurs     Neuro/Psych neg Seizures    GI/Hepatic Neg liver ROS, GERD  Medicated,  Endo/Other  neg diabetes  Renal/GU negative Renal ROS     Musculoskeletal   Abdominal   Peds  Hematology   Anesthesia Other Findings   Reproductive/Obstetrics                             Anesthesia Physical Anesthesia Plan  ASA: III  Anesthesia Plan: General   Post-op Pain Management:    Induction: Intravenous  PONV Risk Score and Plan: 2 and Dexamethasone, Ondansetron and TIVA  Airway Management Planned: Nasal Cannula  Additional Equipment:   Intra-op Plan:   Post-operative Plan:   Informed Consent: I have reviewed the patients History and Physical, chart, labs and discussed the procedure including the risks, benefits and alternatives for the proposed anesthesia with the patient or authorized representative who has indicated his/her understanding and acceptance.     Plan Discussed with:   Anesthesia Plan Comments:         Anesthesia Quick Evaluation

## 2017-12-27 NOTE — Care Management Obs Status (Signed)
[facility] NOTIFICATION   Patient Details  Name: Eric [name] MRN: [id number] Date of Birth: [birth year]   Medicare Observation Status Notification Given:  Yes    Emalene Welte A Shanekqua Schaper, RN 12/27/2017, 4:04 PM

## 2017-12-27 NOTE — H&P (Signed)
Jump to Section ? Document InformationEncounter DetailsLast Filed Vital SignsPatient ContactsPatient DemographicsPlan of TreatmentProgress NotesReason for VisitSocial HistoryVisit Diagnoses [employer], generated on Aug. 02, 2019August 02, 2019 Printout Information  Document Contents Document Received Date Document Source Organization  Office Visit Aug. 02, 2019August 02, 2019 [place]   Patient Demographics - 84 y.o. Male; born [birth year]   Patient Address Communication Language Race / Ethnicity  [address] [place], [place] [address] [phone] [facility]) [phone] (Home) English (Preferred) Black or African American / Not Hispanic or Latino   Reason for Visit   Reason Comments  Hyperlipidemia stress echo today, holter  Hypertension     Encounter Details   Date Type Department Care Team Description  12/08/2017 Office Visit [place]  [place], [place] [address]  [phone]  Flossie Dibble, MD  [address]  [facility]  [place], [place] [address]  [phone]  [phone] (Fax)  Bradycardia by electrocardiogram (Primary Dx);  Symptomatic bradycardia;  Episode of dizziness   Social History - documented as of this encounter  Tobacco Use Types Packs/Day Years Used Date  Never Smoker      Smokeless Tobacco: Never Used      Alcohol Use Drinks/Week oz/Week Comments  No      Sex Assigned at [employer] Date Recorded  Not on file    Job Start Date Occupation Industry  Not on file Not on file Not on file   Travel History Travel Start Travel End  No recent travel history available.     Last Filed Vital Signs - documented in this encounter  Vital Sign Reading Time Taken Comments  Blood Pressure 172/72 12/08/2017 10:09 AM EDT   Pulse 60 12/08/2017 10:09 AM EDT   Temperature - -   Respiratory Rate - -   Oxygen Saturation -  -   Inhaled Oxygen Concentration - -   Weight 69.4 kg (153 lb) 12/08/2017 10:09 AM EDT   Height 177.8 cm (5\' 10" ) 12/08/2017 10:09 AM EDT   Body Mass Index 21.95 12/08/2017 10:09 AM EDT    Progress Notes - documented in this encounter  Flossie Dibble, MD - 12/08/2017 10:15 AM EDT Formatting of this note might be different from the original. Established Patient Visit   Chief Complaint: Chief Complaint  Patient presents with  . Hyperlipidemia  stress echo today, holter  . Hypertension  Date of Service: 12/08/2017 Date of Birth: 08-28-1923 PCP: Armando Reichert, MD  History of Present Illness: Mr. Sako is a 84 y.o.male patient  Shortness of breath The patient presents with acute shortness of breath worsening with increased severity over the last 3 weeks which occurs with moderate exertion but does not limits ADLs associated with walking fast and relived by rest and lasting intermittent (<1 minute). Other related symptoms include irregular heart beat. The differential diagnosis includes hypertension, decrease exercise tolerance and rhythm disturbance  Dizziness The patient has had Acute dizziness over the last 5 weeks associated with moving from lying to sitting position, moving from sitting to standing position, not doing anything and walking with variable relief. These symptoms appear to be worsening with increased severity and frequency. Other symptoms include confusion, dimming vision and drowsinessdizziness, Lightheaded and near syncope. Possible causes include arrhythmia Bradycardia The patient has a new problem of dizziness, syncope or pre-syncope, shortness of breath and slow pulse worsening with increased severity and frequency over the last 4 weeks with ecg,  pulse rate and holter changes consistent with sinus bradycardia and sick sinus syndrome with a differential diagnosis and/or exacerbation by sick sinus syndrome. We have discussed possible diagnostics and or  treatment options for this issue. This may be relieved by pacer placement  Holter The holter monitor shows occasional PVCs, PACs, sick sinus syndrome and symptomatic bradycardia  Stress Test Exercise Stress Echo was performed showing Normal test. without ischemia but poor chronotropic competence Results for orders placed or performed in visit on 12/08/17  Echo stress test  Result Value Ref Range  LV Ejection Fraction (%) 55  Aortic Valve Regurgitation Grade mild  Aortic Valve Stenosis Grade none  Mitral Valve Regurgitation Grade trivial  Mitral Valve Stenosis Grade none  Tricuspid Valve Regurgitation Grade moderate  Tricuspid Valve Regurgitation Max Velocity (m/s) 3.1 m/sec  Right Ventricle Systolic Pressure (mmHg) 29.5 mmHg  Narrative  CARDIOLOGY DEPARTMENT [place], [place] [id number] [place] #: [id number] [address] [place], [place] [address] Date: 12/08/2017 09: 53 AM Adult Male Age: 37 yrs ECHOCARDIOGRAM REPORT Outpatient KC^^KCWC STUDY:Stress Echo TAPE: MD1: MEL[place]ILLE, BONNIE JEAN ECHO:Yes DOPPLER:Yes FILE:0000-000-000 BP: 171/72 mmHg COLOR:Yes CONTRAST:No MACHINE:Philips RV BIOPSY:No 3D:No SOUND QLTY:Moderate Height: 70 in MEDIUM:None Weight: 153 lb BSA: 1.9 m2 _________________________________________________________________________________________ HISTORY: SOB REASON: Assess, LV function Indication: R06.02 Shortness of breath, R94.31 Abnormal electrocardiogram [ECG] [EKG] _________________________________________________________________________________________ STRESS ECHOCARDIOGRAPHY Protocol: Treadmill (Modified Bruce) Drugs: None Target HR: 109 bpm Maximum Predicted HR: 128 bpm +----------------------------------------------------------------------------+ :Stage Duration HR BP : : RESTING : :60 :171/72 : :---------------+----------------------------+----------+---------+ : : EXERCISE :7:31 :148 :/  : :---------------+----------------------------+----------+---------+ : : RECOVERY :6:06 :72 :187/74 : :---------------+----------------------------+----------+---------+ : +----------------------------------------------------------------------------+ Stress Duration: 7: 31 mm: ss Max Stress H.R.: 148 bpm Target HR Achieved: Yes _________________________________________________________________________________________ WALL SEGMENT CHANGES Rest Stress Anterior Septum Basal: Normal Hyperkinetic Mid: Normal Hyperkinetic Apical: Normal Hyperkinetic Anterior Wall Basal: Normal Hyperkinetic Mid: Normal Hyperkinetic Apical: Normal Hyperkinetic Lateral Wall Basal: Normal Hyperkinetic Mid: Normal Hyperkinetic Apical: Normal Hyperkinetic Posterior Wall Basal: Normal Hyperkinetic Mid: Normal Hyperkinetic Inferior Wall Basal: Normal Hyperkinetic Mid: Normal Hyperkinetic Apical: Normal Hyperkinetic Inferior Septum Basal: Normal Hyperkinetic Mid: Normal Hyperkinetic Resting EF: >55% (Est.) Stress EF: >55% (Est.)  _________________________________________________________________________________________ ADDITIONAL FINDINGS _________________________________________________________________________________________ STRESS ECG RESULTS ECG Results: Normal _________________________________________________________________________________________ ECHOCARDIOGRAPHIC DESCRIPTIONS LEFT VENTRICLE Size: Normal Contraction: Normal LV Masses: No Masses LVH: Mild LVH CONCENTRIC RIGHT VENTRICLE Size: Normal Free Wall: Normal Contraction: Normal RV Masses: No mass PERICARDIUM Fluid: No effusion _________________________________________________________________________________________  DOPPLER ECHO and OTHER SPECIAL PROCEDURES Aortic: MILD AR No AS Mitral: TRIVIAL MR No MS MV Inflow E Vel = nm* MV Annulus E'Vel = nm* E/E'Ratio = nm* Tricuspid: MODERATE TR No TS 314.9 cm/sec peak TR vel 49.7 mmHg peak  RV pressure Pulmonary: TRIVIAL PR No PS _________________________________________________________________________________________ ECHOCARDIOGRAPHIC MEASUREMENTS 2D DIMENSIONS AORTA Values Normal Range MAIN PA Values Normal Range Annulus: nm* [2.3-2.9] PA Main: nm* [1.5-2.1] Aorta Sin: nm* [3.1-3.7] RIGHT VENTRICLE ST Junction: nm* [2.6-3.2] RV Base: nm* [<4.2] Asc.Aorta: nm* [2.6-3.4] RV Mid: nm* [<3.5] LEFT VENTRICLE RV Length: nm* [<8.6] LVIDd: nm* [4.2-5.9] INFERIOR VENA CAVA LVIDs: nm* Max. IVC: nm* [<=2.1] FS: nm* [>25] Min. IVC: nm* SWT: nm* [0.6-1.0] ------------------ PWT: nm* [0.6-1.0] nm* - not measured LEFT ATRIUM LA Diam: nm* [3.0-4.0] LA A4C Area: nm* [<20] LA Volume: nm* [18-58] _________________________________________________________________________________________ INTERPRETATION Normal Stress Echocardiogram WITH MILD LVH NORMAL RIGHT VENTRICULAR SYSTOLIC FUNCTION MODERATE VALVULAR REGURGITATION (See above) NO VALVULAR STENOSIS NOTED _________________________________________________________________________________________ Electronically signed by MD Serafina Royals on 12/08/2017 10: 41 AM Performed By: Hassell Done,  Lona Millard, RVT Ordering Physician: Etta Quill _________________________________________________________________________________________   Past Medical and Surgical History  Past Medical History Past Medical History:  Diagnosis Date  . Depression  . Hyperlipidemia  . Hypertension   Past Surgical History He has no past surgical history on file.   Medications and Allergies  Current Medications  Current Outpatient Medications on File Prior to Visit  Medication Sig Dispense Refill  . aspirin 81 MG EC tablet Take 81 mg by mouth once daily.  Marland Kitchen FLUoxetine (PROZAC) 20 MG capsule Take 20 mg by mouth once daily.  Marland Kitchen losartan-hydrochlorothiazide (HYZAAR) 50-12.5 mg tablet Take 1 tablet by mouth once daily.  Marland Kitchen omeprazole (PRILOSEC) 20 MG DR capsule  Take by mouth  . simvastatin (ZOCOR) 40 MG tablet Take 40 mg by mouth nightly.  . tamsulosin (FLOMAX) 0.4 mg capsule Take 0.4 mg by mouth once daily. Take 30 minutes after same meal each day.   No current facility-administered medications on file prior to visit.   Allergies: Patient has no known allergies.  Social and Family History  Social History reports that he has never smoked. He has never used smokeless tobacco. He reports that he does not drink alcohol.  Family History Family History  Problem Relation Age of Onset  . No Known Problems Mother  . No Known Problems Father   Review of Systems   Review of Systems  Positive for sob dizziness Negative for weight gain weight loss, weakness, vision change, hearing loss, cough, congestion, PND, orthopnea, heartburn, nausea, diaphoresis, vomiting, diarrhea, bloody stool, melena, stomach pain, extremity pain, leg weakness, leg cramping, leg blood clots, headache, nosebleed, trouble swallowing, mouth pain, urinary frequency, urination at night, muscle weakness, skin lesions, skin rashes, tingling ,ulcers, numbness, anxiety, and/or depression Physical Examination   Vitals:BP 172/72 (BP Location: Left upper arm, Patient Position: Sitting, BP Cuff Size: Adult)  Pulse 60  Ht 177.8 cm (5\' 10" )  Wt 69.4 kg (153 lb)  BMI 21.95 kg/m  Ht:177.8 cm (5\' 10" ) Wt:69.4 kg (153 lb) TKW:IOXB surface area is 1.85 meters squared. Body mass index is 21.95 kg/m. Appearance: well appearing in no acute distress HEENT: Pupils equally reactive to light and accomodation, no xanthalasma  Neck: Supple, no apparent thyromegaly, masses, or lymphadenopathy  Lungs: normal respiratory effort; no crackles, no rhonchi, no wheezes Heart: Regular rate and rhythm. Normal S1 S2 No gallops, murmur, no rub, PMI is normal size and placement. carotid upstroke normal without bruit. Jugular venous pressure is normal Abdomen: soft, nontender, not distended with normal bowel  sounds. No apparent hepatosplenomegally. Abdominal aorta is normal size without bruit Extremities: no edema, no ulcers, no clubbing, no cyanosis Peripheral Pulses: 2+ in upper extremities, 2+ femoral pulses bilaterally, 2+lower extremity  Musculoskeletal; Normal muscle tone without kyphosis Neurological: Oriented and Alert, Cranial nerves intact  Assessment   84 y.o. male with  Encounter Diagnoses  Name Primary?  . Bradycardia by electrocardiogram Yes  . Symptomatic bradycardia  . Episode of dizziness   Plan   -The patient is to have consultation and permanent pacemaker placement for sick sinus syndrome and symptomatic bradycardia. The patient understands all risks and benefits of permanent pacemaker placement. This includes the possibility of death, stroke, heart attack, hemopericardium, pneumothorax, infection, bleeding, blood clot, and reaction to medications. The patient is at low risk for general anesthesia  No orders of the defined types were placed in this encounter.  No follow-ups on file.  Flossie Dibble, MD    Electronically signed by Serafina Royals  Ulice Dash, MD at 12/08/2017 10:58 AM EDT     Plan of Treatment - documented as of this encounter  Not on file    Visit Diagnoses - documented in this encounter  Diagnosis  Bradycardia by electrocardiogram - Primary   Symptomatic bradycardia   Episode of dizziness    Images Patient Contacts   Contact Name Contact Address Communication Relationship to Patient  Kayton Dunaj Unknown [phone] [facility]) Son or Daughter, Emergency [place] Unknown [phone] (Mobile) Son or Daughter, Emergency Contact   Document Information  Primary Care Provider Other Service Providers Document Coverage Dates  Armando Reichert MD (Jul. 06, 2015July 06, 2015 - Present) [phone] (Work) [phone] (Fax) [address] [place], [place] [address]   Jul. 18, 2019July 18, 2019 -  Jul. 22, 2019July 22, 2019   [place] [address] [place], [place] [address]   Encounter Providers Encounter Date  Flossie Dibble MD (Attending) [phone] (Work) [phone] (Fax) [facility] [place], [place] [address]  Jul. 18, 2019July 18, 2019 - Jul. 22, 2019July 22, 2019    Show All Sections

## 2017-12-28 ENCOUNTER — Encounter: Payer: Self-pay | Admitting: Cardiology

## 2017-12-28 DIAGNOSIS — R0602 Shortness of breath: Secondary | ICD-10-CM | POA: Diagnosis not present

## 2017-12-28 DIAGNOSIS — Z955 Presence of coronary angioplasty implant and graft: Secondary | ICD-10-CM

## 2017-12-28 DIAGNOSIS — E785 Hyperlipidemia, unspecified: Secondary | ICD-10-CM | POA: Diagnosis not present

## 2017-12-28 DIAGNOSIS — R42 Dizziness and giddiness: Secondary | ICD-10-CM | POA: Diagnosis not present

## 2017-12-28 DIAGNOSIS — I700 Atherosclerosis of aorta: Secondary | ICD-10-CM | POA: Diagnosis not present

## 2017-12-28 DIAGNOSIS — Z7982 Long term (current) use of aspirin: Secondary | ICD-10-CM | POA: Diagnosis not present

## 2017-12-28 DIAGNOSIS — R001 Bradycardia, unspecified: Secondary | ICD-10-CM | POA: Diagnosis not present

## 2017-12-28 DIAGNOSIS — F329 Major depressive disorder, single episode, unspecified: Secondary | ICD-10-CM | POA: Diagnosis not present

## 2017-12-28 DIAGNOSIS — I051 Rheumatic mitral insufficiency: Secondary | ICD-10-CM | POA: Diagnosis not present

## 2017-12-28 DIAGNOSIS — I10 Essential (primary) hypertension: Secondary | ICD-10-CM | POA: Diagnosis not present

## 2017-12-28 MED ORDER — CEPHALEXIN 250 MG PO CAPS: 250.0000 mg | ORAL_CAPSULE | Freq: Four times a day (QID) | ORAL | 0 refills | Status: AC

## 2017-12-28 NOTE — Progress Notes (Signed)
Pt discharged home today pacemaker teaching provided, along with [employer] info book and pt ID, follow up appointment information and prescription given. Family at bedside to aid in home care. VSS, no complaints at this time.

## 2017-12-28 NOTE — Discharge Instructions (Signed)
For 8 weeks, avoid lifting greater than 15 pounds or raising your left arm above your head. You may shower in 24 hours, but avoid direct contact with the shower head to incision site. Leave steri-strips alone. You will receive a [employer] Carelink box so that you can have your pacemaker monitored at home. Plug it in at home in the room where you sleep, within 10 feet of your head. You do not need to bring this with you to your first appointment with Dr. [name].

## 2017-12-28 NOTE — Plan of Care (Signed)
  Problem: Education: Goal: Knowledge of cardiac device and self-care will improve Outcome: Adequate for Discharge   Problem: Education: Goal: Ability to safely manage health related needs after discharge will improve Outcome: Adequate for Discharge   Problem: Cardiac: Goal: Ability to achieve and maintain adequate cardiopulmonary perfusion will improve Outcome: Adequate for Discharge

## 2017-12-28 NOTE — Discharge Summary (Signed)
Physician Discharge Summary  Patient ID: Chasin Findling MRN: [id number] DOB/AGE: [birth year] 82 y.o.  Admit date: 12/27/2017 Discharge date: 12/28/2017  Primary Discharge Diagnosis symptomatic bradycardia Secondary Discharge Diagnosis same  Significant Diagnostic Studies: Chest xray negative for pneumothorax   Consults: cardiology  Hospital Course: 84 year old male with a diagnosis of symptomatic bradycardia who failed conservative measures and elected for surgical management.  The patient underwent successful insertion of dual-chamber pacemaker on 12/27/2017 without perioperative complications.  ECG this morning reveals AV dual paced rhythm at a rate of 60 bpm.  The patient is ambulating this morning without difficulty.  He denies significant pain to the incision site, and reports feeling well overall. Chest x-ray negative for pneumothorax, and site of pacemaker insertion appears satisfactory with no visible complications, per radiologist reading.   Discharge Exam: Blood pressure (!) 173/72, pulse (!) 59, temperature (!) 97.5 F (36.4 C), temperature source Oral, resp. rate 20, height 5\' 10"  (1.778 m), weight 67.3 kg (148 lb 4.8 oz), SpO2 99 %.  General appearance: alert, cooperative and no distress Head: Normocephalic, without obvious abnormality, atraumatic Back: no kyphosis present, symmetric, no curvature. ROM normal. No CVA tenderness. Cardio: regular rate and rhythm, S1, S2 normal, no murmur, click, rub or gallop Extremities: extremities normal, atraumatic, no cyanosis or edema Skin: warm, dry, no diaphoreis, no obvious rashes Incision/Wound: steri strips in place with scant dry blood. No drainage, erythema, warmth, or edema.  Psych: Good affect, responds appropriately Labs:   Lab Results  Component Value Date   WBC 3.5 (L) 12/20/2017   HGB 12.6 (L) 12/20/2017   HCT 35.9 (L) 12/20/2017   MCV 89.0 12/20/2017   PLT 219 12/20/2017   No results for input(s): NA, K, CL, CO2,  BUN, CREATININE, CALCIUM, PROT, BILITOT, ALKPHOS, ALT, AST, GLUCOSE in the last 168 hours.  Invalid input(s): LABALBU    Radiology: Chest x-ray negative for pneumothorax or visible complications related to pacemaker insertion EKG: AV dual paced rhythm at a rate of 60 bpm  FOLLOW UP PLANS AND APPOINTMENTS  Allergies as of 12/28/2017   No Known Allergies     Medication List    TAKE these medications   aspirin EC 81 MG tablet Take 81 mg by mouth daily.   cephALEXin 250 MG capsule Commonly known as:  KEFLEX Take 1 capsule (250 mg total) by mouth 4 (four) times daily for 7 days.   fluticasone 50 MCG/ACT nasal spray Commonly known as:  FLONASE Place 2 sprays into both nostrils daily. What changed:  how much to take   latanoprost 0.005 % ophthalmic solution Commonly known as:  XALATAN Place 1 drop into both eyes at bedtime.   losartan-hydrochlorothiazide 50-12.5 MG tablet Commonly known as:  HYZAAR Take 1 tablet by mouth daily.   omeprazole 20 MG capsule Commonly known as:  PRILOSEC Take 1 capsule (20 mg total) by mouth daily.   simvastatin 40 MG tablet Commonly known as:  ZOCOR Take 1 tablet (40 mg total) by mouth at bedtime.   tamsulosin 0.4 MG Caps capsule Commonly known as:  FLOMAX Take 1 capsule (0.4 mg total) by mouth daily.   timolol 0.25 % ophthalmic solution Commonly known as:  BETIMOL Place 1 drop into both eyes 2 (two) times daily.      Follow-up Information    Corey Skains, MD. Go in 1 week(s).   Specialty:  Cardiology Contact information: [address] [facility] [place] [address] [phone]  BRING ALL MEDICATIONS WITH YOU TO FOLLOW UP APPOINTMENTS  Time spent with patient to include physician time: 25 minutes Signed:  Clabe Seal PA-C 12/28/2017, 8:35 AM

## 2017-12-29 NOTE — Anesthesia Postprocedure Evaluation (Signed)
Anesthesia Post Note  Patient: Electronics engineer  Procedure(s) Performed: INSERTION PACEMAKER-INITIAL DUAL CHAMBER (Left Chest)  Patient location during evaluation: PACU Anesthesia Type: General Level of consciousness: awake and alert Pain management: pain level controlled Vital Signs Assessment: post-procedure vital signs reviewed and stable Respiratory status: spontaneous breathing and respiratory function stable Cardiovascular status: stable Anesthetic complications: no     Last Vitals:  Vitals:   12/28/17 0728 12/28/17 0731  BP: (!) 182/73 (!) 173/72  Pulse: (!) 59 (!) 59  Resp: 20   Temp: (!) 36.4 C   SpO2: 99% 99%    Last Pain:  Vitals:   12/28/17 0728  TempSrc: Oral  PainSc:                  Sebastyan Snodgrass K

## 2018-01-04 DIAGNOSIS — Z950 Presence of cardiac pacemaker: Secondary | ICD-10-CM | POA: Insufficient documentation

## 2018-01-04 DIAGNOSIS — R42 Dizziness and giddiness: Secondary | ICD-10-CM | POA: Diagnosis not present

## 2018-01-04 DIAGNOSIS — R001 Bradycardia, unspecified: Secondary | ICD-10-CM | POA: Diagnosis not present

## 2018-01-04 DIAGNOSIS — R9431 Abnormal electrocardiogram [ECG] [EKG]: Secondary | ICD-10-CM | POA: Diagnosis not present

## 2018-01-04 DIAGNOSIS — R0602 Shortness of breath: Secondary | ICD-10-CM | POA: Diagnosis not present

## 2018-01-10 DIAGNOSIS — R001 Bradycardia, unspecified: Secondary | ICD-10-CM | POA: Diagnosis not present

## 2018-01-17 ENCOUNTER — Other Ambulatory Visit: Payer: Self-pay | Admitting: Family Medicine

## 2018-01-17 DIAGNOSIS — K219 Gastro-esophageal reflux disease without esophagitis: Secondary | ICD-10-CM

## 2018-01-17 DIAGNOSIS — I10 Essential (primary) hypertension: Secondary | ICD-10-CM

## 2018-01-17 DIAGNOSIS — N401 Enlarged prostate with lower urinary tract symptoms: Secondary | ICD-10-CM

## 2018-02-02 DIAGNOSIS — I10 Essential (primary) hypertension: Secondary | ICD-10-CM | POA: Diagnosis not present

## 2018-02-02 DIAGNOSIS — R001 Bradycardia, unspecified: Secondary | ICD-10-CM | POA: Diagnosis not present

## 2018-02-02 DIAGNOSIS — E782 Mixed hyperlipidemia: Secondary | ICD-10-CM | POA: Diagnosis not present

## 2018-04-03 DIAGNOSIS — R001 Bradycardia, unspecified: Secondary | ICD-10-CM | POA: Diagnosis not present

## 2018-04-03 DIAGNOSIS — I10 Essential (primary) hypertension: Secondary | ICD-10-CM | POA: Diagnosis not present

## 2018-04-03 DIAGNOSIS — E782 Mixed hyperlipidemia: Secondary | ICD-10-CM | POA: Diagnosis not present

## 2018-04-05 DIAGNOSIS — H40153 Residual stage of open-angle glaucoma, bilateral: Secondary | ICD-10-CM | POA: Diagnosis not present

## 2018-05-10 ENCOUNTER — Other Ambulatory Visit: Payer: Self-pay | Admitting: Family Medicine

## 2018-05-10 DIAGNOSIS — E785 Hyperlipidemia, unspecified: Secondary | ICD-10-CM

## 2018-06-03 ENCOUNTER — Other Ambulatory Visit: Payer: Self-pay | Admitting: Family Medicine

## 2018-06-03 DIAGNOSIS — N401 Enlarged prostate with lower urinary tract symptoms: Secondary | ICD-10-CM

## 2018-06-03 DIAGNOSIS — K219 Gastro-esophageal reflux disease without esophagitis: Secondary | ICD-10-CM

## 2018-06-20 ENCOUNTER — Ambulatory Visit (INDEPENDENT_AMBULATORY_CARE_PROVIDER_SITE_OTHER): Payer: Medicare HMO

## 2018-06-20 DIAGNOSIS — Z23 Encounter for immunization: Secondary | ICD-10-CM

## 2018-07-25 DIAGNOSIS — H40153 Residual stage of open-angle glaucoma, bilateral: Secondary | ICD-10-CM | POA: Diagnosis not present

## 2018-07-25 DIAGNOSIS — H524 Presbyopia: Secondary | ICD-10-CM | POA: Diagnosis not present

## 2018-08-28 ENCOUNTER — Other Ambulatory Visit: Payer: Self-pay | Admitting: Family Medicine

## 2018-08-28 DIAGNOSIS — E785 Hyperlipidemia, unspecified: Secondary | ICD-10-CM

## 2018-09-14 ENCOUNTER — Other Ambulatory Visit: Payer: Self-pay

## 2018-09-14 ENCOUNTER — Ambulatory Visit (INDEPENDENT_AMBULATORY_CARE_PROVIDER_SITE_OTHER): Payer: Medicare HMO | Admitting: Family Medicine

## 2018-09-14 ENCOUNTER — Encounter: Payer: Self-pay | Admitting: Family Medicine

## 2018-09-14 VITALS — BP 138/88 | HR 68 | Ht 70.0 in | Wt 147.0 lb

## 2018-09-14 DIAGNOSIS — R69 Illness, unspecified: Secondary | ICD-10-CM | POA: Diagnosis not present

## 2018-09-14 DIAGNOSIS — K219 Gastro-esophageal reflux disease without esophagitis: Secondary | ICD-10-CM

## 2018-09-14 DIAGNOSIS — E785 Hyperlipidemia, unspecified: Secondary | ICD-10-CM

## 2018-09-14 DIAGNOSIS — I10 Essential (primary) hypertension: Secondary | ICD-10-CM | POA: Diagnosis not present

## 2018-09-14 DIAGNOSIS — N401 Enlarged prostate with lower urinary tract symptoms: Secondary | ICD-10-CM | POA: Diagnosis not present

## 2018-09-14 MED ORDER — LOSARTAN POTASSIUM-HCTZ 50-12.5 MG PO TABS: 1.0000 | ORAL_TABLET | Freq: Every day | ORAL | 1 refills | Status: DC

## 2018-09-14 MED ORDER — SIMVASTATIN 40 MG PO TABS: 40.0000 mg | ORAL_TABLET | Freq: Every day | ORAL | 1 refills | Status: DC

## 2018-09-14 MED ORDER — OMEPRAZOLE 20 MG PO CPDR: 20.0000 mg | DELAYED_RELEASE_CAPSULE | Freq: Every day | ORAL | 1 refills | Status: DC

## 2018-09-14 MED ORDER — TAMSULOSIN HCL 0.4 MG PO CAPS: 0.4000 mg | ORAL_CAPSULE | Freq: Every day | ORAL | 1 refills | Status: DC

## 2018-09-14 NOTE — Patient Instructions (Signed)
This information is directly available on the CDC website: https://www.cdc.gov/coronavirus/2019-ncov/if-you-are-sick/steps-when-sick.html    Source:CDC Reference to specific commercial products, manufacturers, companies, or trademarks does not constitute its endorsement or recommendation by the U.S. Government, Department of Health and Human Services, or Centers for Disease Control and Prevention.  

## 2018-09-14 NOTE — Progress Notes (Signed)
Date:  09/14/2018   Name:  Eric [name]   DOB:  17-Jul-1923   MRN:  [id number]   Chief Complaint: Hypertension; Hyperlipidemia; Gastroesophageal Reflux; and Benign Prostatic Hypertrophy  Hypertension  This is a chronic problem. The current episode started more than 1 year ago. The problem is unchanged. The problem is controlled. Pertinent negatives include no anxiety, blurred vision, chest pain, headaches, malaise/fatigue, neck pain, orthopnea, palpitations, peripheral edema, PND, shortness of breath or sweats. There are no associated agents to hypertension. Risk factors for coronary artery disease include dyslipidemia and male gender. Past treatments include angiotensin blockers and diuretics. The current treatment provides moderate improvement. There are no compliance problems.  There is no history of angina, kidney disease, CAD/MI, CVA, heart failure, left ventricular hypertrophy, PVD or retinopathy. There is no history of chronic renal disease, a hypertension causing med or renovascular disease.  Hyperlipidemia  This is a chronic problem. The current episode started more than 1 year ago. The problem is controlled. Recent lipid tests were reviewed and are normal. He has no history of chronic renal disease, diabetes, hypothyroidism, liver disease, obesity or nephrotic syndrome. Factors aggravating his hyperlipidemia include thiazides. Pertinent negatives include no chest pain, focal sensory loss, focal weakness, leg pain, myalgias or shortness of breath. Current antihyperlipidemic treatment includes statins. There are no compliance problems.  Risk factors for coronary artery disease include dyslipidemia, hypertension and male sex.  Gastroesophageal Reflux  He reports no abdominal pain, no chest pain, no choking, no coughing, no dysphagia, no early satiety, no heartburn, no hoarse voice, no nausea, no sore throat, no stridor, no tooth decay, no water brash or no wheezing. This is a chronic problem.  The problem has been gradually improving. Nothing aggravates the symptoms. He has tried a PPI for the symptoms.  Benign Prostatic Hypertrophy  Irritative symptoms do not include frequency or urgency. Pertinent negatives include no chills, dysuria, hematuria or nausea.    Review of Systems  Constitutional: Negative for chills, fever and malaise/fatigue.  HENT: Negative for drooling, ear discharge, ear pain, hoarse voice and sore throat.   Eyes: Negative for blurred vision.  Respiratory: Negative for cough, choking, shortness of breath and wheezing.   Cardiovascular: Negative for chest pain, palpitations, orthopnea, leg swelling and PND.  Gastrointestinal: Negative for abdominal pain, blood in stool, constipation, diarrhea, dysphagia, heartburn and nausea.  Endocrine: Negative for polydipsia.  Genitourinary: Negative for dysuria, frequency, hematuria and urgency.  Musculoskeletal: Negative for back pain, myalgias and neck pain.  Skin: Negative for rash.  Allergic/Immunologic: Negative for environmental allergies.  Neurological: Negative for dizziness, focal weakness and headaches.  Hematological: Does not bruise/bleed easily.  Psychiatric/Behavioral: Negative for suicidal ideas. The patient is not nervous/anxious.     Patient Active Problem List   Diagnosis Date Noted  . Sick sinus syndrome ([place]) 12/27/2017  . Essential hypertension 10/22/2015  . Hyperlipidemia 10/22/2015  . Benign prostatic hyperplasia 10/22/2015    No Known Allergies  Past Surgical History:  Procedure Laterality Date  . EYE SURGERY     cataract extraction  . HERNIA REPAIR Left 2014   inguinal hernia  . PACEMAKER INSERTION Left 12/27/2017   Procedure: INSERTION PACEMAKER-INITIAL DUAL CHAMBER;  Surgeon: Isaias Cowman, MD;  Location: [facility] ORS;  Service: Cardiovascular;  Laterality: Left;    Social History   Tobacco Use  . Smoking status: Never Smoker  . Smokeless tobacco: Never Used  Substance Use  Topics  . Alcohol use: No  . Drug use: No  Medication list has been reviewed and updated.  Current Meds  Medication Sig  . aspirin EC 81 MG tablet Take 81 mg by mouth daily.  . fluticasone (FLONASE) 50 MCG/ACT nasal spray Place 2 sprays into both nostrils daily. (Patient taking differently: Place 1 spray into both nostrils daily. )  . latanoprost (XALATAN) 0.005 % ophthalmic solution Place 1 drop into both eyes at bedtime.  Marland Kitchen losartan-hydrochlorothiazide (HYZAAR) 50-12.5 MG tablet TAKE 1 TABLET EVERY DAY  . omeprazole (PRILOSEC) 20 MG capsule TAKE 1 CAPSULE EVERY DAY  . simvastatin (ZOCOR) 40 MG tablet TAKE 1 TABLET (40 MG TOTAL) BY MOUTH AT BEDTIME.  . tamsulosin (FLOMAX) 0.4 MG CAPS capsule TAKE 1 CAPSULE EVERY DAY  . timolol (BETIMOL) 0.25 % ophthalmic solution Place 1 drop into both eyes 2 (two) times daily.    PHQ 2/9 Scores 09/14/2018 03/21/2017 10/22/2015  PHQ - 2 Score 0 0 0  PHQ- 9 Score 0 0 -    BP Readings from Last 3 Encounters:  09/14/18 138/88  12/28/17 (!) 173/72  12/20/17 (!) 186/58    Physical Exam Vitals signs and nursing note reviewed.  HENT:     Head: Normocephalic.     Right Ear: External ear normal.     Left Ear: External ear normal.     Nose: Nose normal.  Eyes:     General: No scleral icterus.       Right eye: No discharge.        Left eye: No discharge.     Conjunctiva/sclera: Conjunctivae normal.     Pupils: Pupils are equal, round, and reactive to light.  Neck:     Musculoskeletal: Normal range of motion and neck supple.     Thyroid: No thyromegaly.     Vascular: No JVD.     Trachea: No tracheal deviation.  Cardiovascular:     Rate and Rhythm: Normal rate and regular rhythm.     Heart sounds: Normal heart sounds. No murmur. No friction rub. No gallop.   Pulmonary:     Effort: No respiratory distress.     Breath sounds: Normal breath sounds. No wheezing or rales.  Abdominal:     General: Bowel sounds are normal.     Palpations:  Abdomen is soft. There is no mass.     Tenderness: There is no abdominal tenderness. There is no guarding or rebound.  Musculoskeletal: Normal range of motion.        General: No tenderness.  Lymphadenopathy:     Cervical: No cervical adenopathy.  Skin:    General: Skin is warm.     Findings: No rash.  Neurological:     Mental Status: He is alert and oriented to person, place, and time.     Cranial Nerves: No cranial nerve deficit.     Deep Tendon Reflexes: Reflexes are normal and symmetric.     Wt Readings from Last 3 Encounters:  09/14/18 147 lb (66.7 kg)  12/28/17 148 lb 4.8 oz (67.3 kg)  12/20/17 149 lb 6 oz (67.8 kg)    BP 138/88   Pulse 68   Ht 5\' 10"  (1.778 m)   Wt 147 lb (66.7 kg)   BMI 21.09 kg/m   Assessment and Plan: 1. Essential hypertension Chronic.  Controlled.  Continue losartan hydrochlorothiazide 50-12 0.5 once a day will check renal function panel. - losartan-hydrochlorothiazide (HYZAAR) 50-12.5 MG tablet; Take 1 tablet by mouth daily.  Dispense: 90 tablet; Refill: 1 - Renal Function Panel  2.  Gastroesophageal reflux disease, esophagitis presence not specified .  Controlled.  Patient is doing well on current regimen of omeprazole 20 mg once a day this will be continued will refill and recheck in 6 months - omeprazole (PRILOSEC) 20 MG capsule; Take 1 capsule (20 mg total) by mouth daily.  Dispense: 90 capsule; Refill: 1  3. Hyperlipidemia, unspecified hyperlipidemia type Chronic.  Controlled.  Review of previous lipid panel normal will recheck lipid panel today and adjust accordingly we will continue his simvastatin 40 mg once a day - simvastatin (ZOCOR) 40 MG tablet; Take 1 tablet (40 mg total) by mouth at bedtime.  Dispense: 90 tablet; Refill: 1 - Lipid Panel With LDL/HDL Ratio  4. Benign prostatic hyperplasia with lower urinary tract symptoms, symptom details unspecified Chronic controlled.  Without symptoms was asymptomatic.  Will continue tamsulosin  0.4 mg daily.  Will check PSA. - tamsulosin (FLOMAX) 0.4 MG CAPS capsule; Take 1 capsule (0.4 mg total) by mouth daily.  Dispense: 90 capsule; Refill: 1 - PSA  5. Taking medication for chronic disease Patient currently on a statin will check hepatic function hepatotoxicity. - Hepatic function panel

## 2018-09-15 LAB — LIPID PANEL WITH LDL/HDL RATIO
Cholesterol, Total: 186 mg/dL (ref 100–199)
HDL: 86 mg/dL (ref >39)
LDL Calculated: 91 mg/dL (ref 0–99)
LDl/HDL Ratio: 1.1 ratio (ref 0.0–3.6)
Triglycerides: 43 mg/dL (ref 0–149)
VLDL Cholesterol Cal: 9 mg/dL (ref 5–40)

## 2018-09-15 LAB — HEPATIC FUNCTION PANEL
ALT: 12 IU/L (ref 0–44)
AST: 19 IU/L (ref 0–40)
Alkaline Phosphatase: 48 IU/L (ref 39–117)
Bilirubin Total: 0.4 mg/dL (ref 0.0–1.2)
Bilirubin, Direct: 0.11 mg/dL (ref 0.00–0.40)
Total Protein: 7.1 g/dL (ref 6.0–8.5)

## 2018-09-15 LAB — RENAL FUNCTION PANEL
Albumin: 3.8 g/dL (ref 3.5–4.6)
BUN/Creatinine Ratio: 23 (ref 10–24)
BUN: 24 mg/dL (ref 10–36)
CO2: 25 mmol/L (ref 20–29)
Calcium: 9 mg/dL (ref 8.6–10.2)
Chloride: 103 mmol/L (ref 96–106)
Creatinine, Ser: 1.06 mg/dL (ref 0.76–1.27)
GFR calc Af Amer: 70 mL/min/{1.73_m2} (ref >59)
GFR calc non Af Amer: 61 mL/min/{1.73_m2} (ref >59)
Glucose: 79 mg/dL (ref 65–99)
Phosphorus: 3.3 mg/dL (ref 2.8–4.1)
Potassium: 3.9 mmol/L (ref 3.5–5.2)
Sodium: 144 mmol/L (ref 134–144)

## 2018-09-15 LAB — PSA: Prostate Specific Ag, Serum: 1.9 ng/mL (ref 0.0–4.0)

## 2018-09-21 ENCOUNTER — Other Ambulatory Visit: Payer: Self-pay

## 2018-09-21 DIAGNOSIS — J301 Allergic rhinitis due to pollen: Secondary | ICD-10-CM

## 2018-09-21 MED ORDER — FLUTICASONE PROPIONATE 50 MCG/ACT NA SUSP: 1.0000 | Freq: Every day | NASAL | 0 refills | Status: DC

## 2018-10-02 DIAGNOSIS — R0789 Other chest pain: Secondary | ICD-10-CM | POA: Diagnosis not present

## 2018-10-02 DIAGNOSIS — E782 Mixed hyperlipidemia: Secondary | ICD-10-CM | POA: Diagnosis not present

## 2018-10-02 DIAGNOSIS — I10 Essential (primary) hypertension: Secondary | ICD-10-CM | POA: Diagnosis not present

## 2018-10-02 DIAGNOSIS — Z950 Presence of cardiac pacemaker: Secondary | ICD-10-CM | POA: Diagnosis not present

## 2018-10-31 DIAGNOSIS — R001 Bradycardia, unspecified: Secondary | ICD-10-CM | POA: Diagnosis not present

## 2018-11-01 ENCOUNTER — Other Ambulatory Visit: Payer: Self-pay

## 2018-11-01 ENCOUNTER — Encounter: Payer: Self-pay | Admitting: Family Medicine

## 2018-11-01 ENCOUNTER — Ambulatory Visit (INDEPENDENT_AMBULATORY_CARE_PROVIDER_SITE_OTHER): Payer: Medicare HMO | Admitting: Family Medicine

## 2018-11-01 VITALS — Ht 70.0 in | Wt 146.0 lb

## 2018-11-01 DIAGNOSIS — J0100 Acute maxillary sinusitis, unspecified: Secondary | ICD-10-CM

## 2018-11-01 MED ORDER — AMOXICILLIN 875 MG PO TABS: 875.0000 mg | ORAL_TABLET | Freq: Two times a day (BID) | ORAL | 0 refills | Status: DC

## 2018-11-01 NOTE — Progress Notes (Signed)
Date:  11/01/2018   Name:  Eric [name]   DOB:  1924/02/21   MRN:  [id number]   Chief Complaint: Dizziness (x 1 week ago)  Dizziness  This is a new problem. The current episode started in the past 7 days. The problem occurs intermittently. The problem has been unchanged. Associated symptoms include congestion. Pertinent negatives include no abdominal pain, anorexia, arthralgias, change in bowel habit, chest pain, chills, coughing, diaphoresis, fatigue, fever, headaches, joint swelling, myalgias, nausea, neck pain, numbness, rash, sore throat, swollen glands, urinary symptoms, vertigo, visual change, vomiting or weakness. Treatments tried: flonase. The treatment provided mild relief.  Sinusitis  This is a new problem. The current episode started yesterday. The problem has been waxing and waning since onset. There has been no fever. Associated symptoms include congestion and sinus pressure. Pertinent negatives include no chills, coughing, diaphoresis, ear pain, headaches, hoarse voice, neck pain, shortness of breath, sneezing, sore throat or swollen glands. Treatments tried: flonase. The treatment provided mild relief.    Review of Systems  Constitutional: Negative for chills, diaphoresis, fatigue and fever.  HENT: Positive for congestion and sinus pressure. Negative for drooling, ear discharge, ear pain, hoarse voice, sneezing and sore throat.   Respiratory: Negative for cough, shortness of breath and wheezing.   Cardiovascular: Negative for chest pain, palpitations and leg swelling.  Gastrointestinal: Negative for abdominal pain, anorexia, blood in stool, change in bowel habit, constipation, diarrhea, nausea and vomiting.  Endocrine: Negative for polydipsia.  Genitourinary: Negative for dysuria, frequency, hematuria and urgency.  Musculoskeletal: Negative for arthralgias, back pain, joint swelling, myalgias and neck pain.  Skin: Negative for rash.  Allergic/Immunologic: Negative for  environmental allergies.  Neurological: Positive for dizziness. Negative for vertigo, weakness, numbness and headaches.  Hematological: Does not bruise/bleed easily.  Psychiatric/Behavioral: Negative for suicidal ideas. The patient is not nervous/anxious.     Patient Active Problem List   Diagnosis Date Noted  . Sick sinus syndrome ([place]) 12/27/2017  . Essential hypertension 10/22/2015  . Hyperlipidemia 10/22/2015  . Benign prostatic hyperplasia 10/22/2015    No Known Allergies  Past Surgical History:  Procedure Laterality Date  . EYE SURGERY     cataract extraction  . HERNIA REPAIR Left 2014   inguinal hernia  . PACEMAKER INSERTION Left 12/27/2017   Procedure: INSERTION PACEMAKER-INITIAL DUAL CHAMBER;  Surgeon: Isaias Cowman, MD;  Location: [facility] ORS;  Service: Cardiovascular;  Laterality: Left;    Social History   Tobacco Use  . Smoking status: Never Smoker  . Smokeless tobacco: Never Used  Substance Use Topics  . Alcohol use: No  . Drug use: No     Medication list has been reviewed and updated.  Current Meds  Medication Sig  . aspirin EC 81 MG tablet Take 81 mg by mouth daily.  . fluticasone (FLONASE) 50 MCG/ACT nasal spray Place 1 spray into both nostrils daily.  Marland Kitchen latanoprost (XALATAN) 0.005 % ophthalmic solution Place 1 drop into both eyes at bedtime.  Marland Kitchen losartan (COZAAR) 100 MG tablet Take 1 tablet by mouth daily. Dr [name]  . omeprazole (PRILOSEC) 20 MG capsule Take 1 capsule (20 mg total) by mouth daily.  . simvastatin (ZOCOR) 40 MG tablet Take 1 tablet (40 mg total) by mouth at bedtime.  . tamsulosin (FLOMAX) 0.4 MG CAPS capsule Take 1 capsule (0.4 mg total) by mouth daily.  . timolol (BETIMOL) 0.25 % ophthalmic solution Place 1 drop into both eyes 2 (two) times daily.  . [DISCONTINUED] losartan-hydrochlorothiazide (HYZAAR)  50-12.5 MG tablet Take 1 tablet by mouth daily.    PHQ 2/9 Scores 09/14/2018 09/14/2018 03/21/2017 10/22/2015  PHQ - 2 Score 0 0 0  0  PHQ- 9 Score 0 0 0 -    BP Readings from Last 3 Encounters:  09/14/18 138/88  12/28/17 (!) 173/72  12/20/17 (!) 186/58    Physical Exam Vitals signs and nursing note reviewed.  HENT:     Head: Normocephalic.     Right Ear: External ear normal. There is impacted cerumen.     Left Ear: External ear normal. There is impacted cerumen.     Nose: Nose normal.     Right Turbinates: Swollen.     Left Turbinates: Swollen.     Mouth/Throat:     Mouth: Mucous membranes are moist.     Pharynx: Oropharynx is clear.  Eyes:     General: No scleral icterus.       Right eye: No discharge.        Left eye: No discharge.     Conjunctiva/sclera: Conjunctivae normal.     Pupils: Pupils are equal, round, and reactive to light.  Neck:     Musculoskeletal: Normal range of motion and neck supple.     Thyroid: No thyromegaly.     Vascular: No JVD.     Trachea: No tracheal deviation.  Cardiovascular:     Rate and Rhythm: Normal rate and regular rhythm.     Heart sounds: Normal heart sounds. No murmur. No friction rub. No gallop.   Pulmonary:     Effort: No respiratory distress.     Breath sounds: Normal breath sounds. No wheezing or rales.  Abdominal:     General: Bowel sounds are normal.     Palpations: Abdomen is soft. There is no mass.     Tenderness: There is no abdominal tenderness. There is no guarding or rebound.  Musculoskeletal: Normal range of motion.        General: No tenderness.  Lymphadenopathy:     Cervical: No cervical adenopathy.  Skin:    General: Skin is warm.     Findings: No rash.  Neurological:     Mental Status: He is alert and oriented to person, place, and time.     Cranial Nerves: No cranial nerve deficit.     Deep Tendon Reflexes: Reflexes are normal and symmetric.     Wt Readings from Last 3 Encounters:  11/01/18 146 lb (66.2 kg)  09/14/18 147 lb (66.7 kg)  12/28/17 148 lb 4.8 oz (67.3 kg)    Ht 5\' 10"  (1.778 m)   Wt 146 lb (66.2 kg)   BMI 20.95  kg/m   Assessment and Plan:  1. Subacute maxillary sinusitis Patient presents with dizziness but there was no orthostatic changes noted with blood pressure or pulse.  Patient notes no further dizziness feelings and there is no focals symptoms or signs suggesting a cerebral vascular abnormality.  Evaluation notes only that he has some discomfort in the maxillary sinus areas with nasal congestion.  We will proceed with treatment of a sinus infection with amoxicillin 875 mg twice a day for 10 days. - amoxicillin (AMOXIL) 875 MG tablet; Take 1 tablet (875 mg total) by mouth 2 (two) times daily.  Dispense: 20 tablet; Refill: 0

## 2018-11-28 ENCOUNTER — Other Ambulatory Visit: Payer: Self-pay

## 2018-11-28 DIAGNOSIS — J301 Allergic rhinitis due to pollen: Secondary | ICD-10-CM

## 2018-11-28 MED ORDER — FLUTICASONE PROPIONATE 50 MCG/ACT NA SUSP: 1.0000 | Freq: Every day | NASAL | 1 refills | Status: DC

## 2018-11-29 DIAGNOSIS — H40153 Residual stage of open-angle glaucoma, bilateral: Secondary | ICD-10-CM | POA: Diagnosis not present

## 2018-12-20 ENCOUNTER — Emergency Department
Admission: EM | Admit: 2018-12-20 | Discharge: 2018-12-20 | Disposition: A | Discharge status: Home / Self Care | Payer: Medicare HMO | Attending: Emergency Medicine | Admitting: Emergency Medicine

## 2018-12-20 ENCOUNTER — Other Ambulatory Visit: Payer: Self-pay

## 2018-12-20 DIAGNOSIS — I10 Essential (primary) hypertension: Secondary | ICD-10-CM

## 2018-12-20 DIAGNOSIS — R42 Dizziness and giddiness: Secondary | ICD-10-CM | POA: Diagnosis not present

## 2018-12-20 DIAGNOSIS — Z79899 Other long term (current) drug therapy: Secondary | ICD-10-CM | POA: Insufficient documentation

## 2018-12-20 DIAGNOSIS — I491 Atrial premature depolarization: Secondary | ICD-10-CM | POA: Diagnosis not present

## 2018-12-20 DIAGNOSIS — Z950 Presence of cardiac pacemaker: Secondary | ICD-10-CM | POA: Insufficient documentation

## 2018-12-20 DIAGNOSIS — Z7982 Long term (current) use of aspirin: Secondary | ICD-10-CM | POA: Insufficient documentation

## 2018-12-20 LAB — COMPREHENSIVE METABOLIC PANEL
ALT: 14 U/L (ref 0–44)
AST: 18 U/L (ref 15–41)
Albumin: 3.4 g/dL — ABNORMAL LOW (ref 3.5–5.0)
Alkaline Phosphatase: 35 U/L — ABNORMAL LOW (ref 38–126)
Anion gap: 6 (ref 5–15)
BUN: 26 mg/dL — ABNORMAL HIGH (ref 8–23)
CO2: 27 mmol/L (ref 22–32)
Calcium: 8.5 mg/dL — ABNORMAL LOW (ref 8.9–10.3)
Chloride: 106 mmol/L (ref 98–111)
Creatinine, Ser: 0.99 mg/dL (ref 0.61–1.24)
GFR calc Af Amer: >60 mL/min (ref >60)
GFR calc non Af Amer: >60 mL/min (ref >60)
Glucose, Bld: 105 mg/dL — ABNORMAL HIGH (ref 70–99)
Potassium: 3.9 mmol/L (ref 3.5–5.1)
Sodium: 139 mmol/L (ref 135–145)
Total Bilirubin: 0.7 mg/dL (ref 0.3–1.2)
Total Protein: 6.5 g/dL (ref 6.5–8.1)

## 2018-12-20 LAB — TROPONIN I (HIGH SENSITIVITY)
Troponin I (High Sensitivity): 9 ng/L (ref <18)
Troponin I (High Sensitivity): 9 ng/L (ref <18)

## 2018-12-20 LAB — CBC
HCT: 35.1 % — ABNORMAL LOW (ref 39.0–52.0)
Hemoglobin: 11.4 g/dL — ABNORMAL LOW (ref 13.0–17.0)
MCH: 29.6 pg (ref 26.0–34.0)
MCHC: 32.5 g/dL (ref 30.0–36.0)
MCV: 91.2 fL (ref 80.0–100.0)
Platelets: 197 10*3/uL (ref 150–400)
RBC: 3.85 MIL/uL — ABNORMAL LOW (ref 4.22–5.81)
RDW: 13.3 % (ref 11.5–15.5)
WBC: 3.8 10*3/uL — ABNORMAL LOW (ref 4.0–10.5)
nRBC: 0 % (ref 0.0–0.2)

## 2018-12-20 LAB — URINALYSIS, COMPLETE (UACMP) WITH MICROSCOPIC
Bacteria, UA: NONE SEEN
Bilirubin Urine: NEGATIVE
Glucose, UA: NEGATIVE mg/dL
Ketones, ur: NEGATIVE mg/dL
Leukocytes,Ua: NEGATIVE
Nitrite: NEGATIVE
Protein, ur: NEGATIVE mg/dL
Specific Gravity, Urine: 1.018 (ref 1.005–1.030)
Squamous Epithelial / HPF: NONE SEEN (ref 0–5)
pH: 7 (ref 5.0–8.0)

## 2018-12-20 NOTE — ED Notes (Signed)
Daughter given update.

## 2018-12-20 NOTE — Discharge Instructions (Addendum)
Please seek medical attention for any high fevers, chest pain, shortness of breath, change in behavior, persistent vomiting, bloody stool or any other new or concerning symptoms.  

## 2018-12-20 NOTE — ED Notes (Signed)
Pt walked to toilet with steady gait

## 2018-12-20 NOTE — ED Notes (Signed)
Son Josph Macho [phone]) called and informed that pt was ready for discharge- he is coming from [place] and will be a little while until he gets here

## 2018-12-20 NOTE — ED Triage Notes (Signed)
Pt arrives vis EMS from home after feeling lightheaded and dizzy- pt reports taking bp meds this morning- per EMS bp was 205/104- pt in NAD at this time

## 2018-12-20 NOTE — ED Provider Notes (Signed)
Rest of patient's work up without concerning findings. Patient stated that he continued to feel well. Discussed with patient importance of follow up.   Nance Pear, MD 12/20/18 [phone]

## 2018-12-20 NOTE — ED Notes (Signed)
Eric [name] [phone] pt daughter

## 2018-12-20 NOTE — ED Provider Notes (Signed)
[facility] Emergency Department Provider Note  Time seen: 12:54 PM  I have reviewed the triage vital signs and the nursing notes.   HISTORY  Chief Complaint Hypertension  HPI Eric [name] is a 85 y.o. male with a past medical history of gastric reflux, arthritis, hyperlipidemia, hypertension presents to the emergency department for "swimmy headed."  According to the patient earlier today he felt somewhat dizzy and "swimmy headed", when asked to describe this further he is unable to say if he is lightheaded or if the room is spinning.  States he feels normal currently.  Denies any chest pain or shortness of breath at any point.  Largely negative review of systems.  Patient overall appears extremely well at this time.   Past Medical History:  Diagnosis Date  . Arthritis    knees  . Dysrhythmia    bradycardia  . GERD (gastroesophageal reflux disease)   . Hard of hearing    has a hearing aid but has not worn  . High cholesterol   . History of kidney stones   . Hypertension   . Prostate disorder     Patient Active Problem List   Diagnosis Date Noted  . Essential hypertension 10/22/2015  . Hyperlipidemia 10/22/2015  . Benign prostatic hyperplasia 10/22/2015    Past Surgical History:  Procedure Laterality Date  . EYE SURGERY     cataract extraction  . HERNIA REPAIR Left 2014   inguinal hernia  . PACEMAKER INSERTION Left 12/27/2017   Procedure: INSERTION PACEMAKER-INITIAL DUAL CHAMBER;  Surgeon: Isaias Cowman, MD;  Location: [facility] ORS;  Service: Cardiovascular;  Laterality: Left;    Prior to Admission medications   Medication Sig Start Date End Date Taking? Authorizing Provider  amoxicillin (AMOXIL) 875 MG tablet Take 1 tablet (875 mg total) by mouth 2 (two) times daily. 11/01/18   Juline Patch, MD  aspirin EC 81 MG tablet Take 81 mg by mouth daily.    [provider]  fluticasone (FLONASE) 50 MCG/ACT nasal spray Place 1 spray into  both nostrils daily. 11/28/18   Juline Patch, MD  latanoprost (XALATAN) 0.005 % ophthalmic solution Place 1 drop into both eyes at bedtime.    [provider]  losartan (COZAAR) 100 MG tablet Take 1 tablet by mouth daily. Dr [name] 07/26/18 07/26/19  [provider]  omeprazole (PRILOSEC) 20 MG capsule Take 1 capsule (20 mg total) by mouth daily. 09/14/18   Juline Patch, MD  simvastatin (ZOCOR) 40 MG tablet Take 1 tablet (40 mg total) by mouth at bedtime. 09/14/18   Juline Patch, MD  tamsulosin (FLOMAX) 0.4 MG CAPS capsule Take 1 capsule (0.4 mg total) by mouth daily. 09/14/18   Juline Patch, MD  timolol (BETIMOL) 0.25 % ophthalmic solution Place 1 drop into both eyes 2 (two) times daily.    [provider]    No Known Allergies  History reviewed. No pertinent family history.  Social History Social History   Tobacco Use  . Smoking status: Never Smoker  . Smokeless tobacco: Never Used  Substance Use Topics  . Alcohol use: No  . Drug use: No    Review of Systems Constitutional: Negative for fever. Cardiovascular: Negative for chest pain. Respiratory: Negative for shortness of breath. Gastrointestinal: Negative for abdominal pain, vomiting Musculoskeletal: Negative for musculoskeletal complaints Skin: Negative for skin complaints  Neurological: Negative for headache All other ROS negative  ____________________________________________   PHYSICAL EXAM:  VITAL SIGNS: ED Triage  Vitals  Enc Vitals Group     BP 12/20/18 1212 (!) 166/100     Pulse Rate 12/20/18 1212 77     Resp 12/20/18 1212 (!) 22     Temp 12/20/18 1212 98.1 F (36.7 C)     Temp Source 12/20/18 1212 Oral     SpO2 12/20/18 1212 97 %     Weight 12/20/18 1213 155 lb (70.3 kg)     Height 12/20/18 1213 5\' 10"  (1.778 m)     Head Circumference --      Peak Flow --      Pain Score 12/20/18 1213 0     Pain Loc --      Pain Edu? --      Excl. in [place]? --     Constitutional: Alert  and oriented. Well appearing and in no distress. Eyes: Normal exam ENT      Head: Normocephalic and atraumatic      Mouth/Throat: Mucous membranes are moist. Cardiovascular: Normal rate, regular rhythm.  Respiratory: Normal respiratory effort without tachypnea nor retractions. Breath sounds are clear Gastrointestinal: Soft and nontender. No distention.   Musculoskeletal: Nontender with normal range of motion in all extremities.  Neurologic:  Normal speech and language. No gross focal neurologic deficits  Skin:  Skin is warm, dry and intact.  Psychiatric: Mood and affect are normal.   ____________________________________________    EKG  EKG viewed and interpreted by myself shows AV paced rhythm at 76 bpm, narrow QRS, normal axis, nonspecific Eric changes.  ____________________________________________   INITIAL IMPRESSION / ASSESSMENT AND PLAN / ED COURSE  Pertinent labs & imaging results that were available during my care of the patient were reviewed by me and considered in my medical decision making (see chart for details).   Patient presents to the emergency department feeling dizzy this morning, denies any symptoms at this time.  Denies any chest pain shortness of breath fever cough congestion at any point.  Patient did check his blood pressure and it was elevated greater than 200 which prompted the ER visit.  Overall the patient appears very well, answering all questions appropriately moving all extremities well, great grip strength bilaterally very strong for his age.  Differential would include hypertension, electrolyte or metabolic abnormality, anemia, less likely ACS.  Patient is feeling well.  Blood pressure remains moderately elevated 195/86.  Lab work is thus far reassuring.  Troponin negative.  EKG reassuring.  Urinalysis pending.  If urinalysis is normal anticipate likely discharge home with PCP follow-up.  Patient care signed out to oncoming physician.   Eric [name] was  evaluated in Emergency Department on 12/20/2018 for the symptoms described in the history of present illness. He was evaluated in the context of the global COVID-19 pandemic, which necessitated consideration that the patient might be at risk for infection with the SARS-CoV-2 virus that causes COVID-19. Institutional protocols and algorithms that pertain to the evaluation of patients at risk for COVID-19 are in a state of rapid change based on information released by regulatory bodies including the CDC and federal and state organizations. These policies and algorithms were followed during the patient's care in the ED.  ____________________________________________   FINAL CLINICAL IMPRESSION(S) / ED DIAGNOSES  Dizziness   Harvest Dark, MD 12/20/18 1500

## 2018-12-21 ENCOUNTER — Encounter: Payer: Self-pay | Admitting: Family Medicine

## 2018-12-21 ENCOUNTER — Other Ambulatory Visit: Payer: Self-pay

## 2018-12-21 ENCOUNTER — Ambulatory Visit (INDEPENDENT_AMBULATORY_CARE_PROVIDER_SITE_OTHER): Payer: Medicare HMO | Admitting: Family Medicine

## 2018-12-21 VITALS — BP 160/70 | HR 64 | Ht 70.0 in | Wt 147.0 lb

## 2018-12-21 DIAGNOSIS — T675XXD Heat exhaustion, unspecified, subsequent encounter: Secondary | ICD-10-CM

## 2018-12-21 DIAGNOSIS — E860 Dehydration: Secondary | ICD-10-CM | POA: Diagnosis not present

## 2018-12-21 NOTE — Progress Notes (Signed)
Date:  12/21/2018   Name:  Eric [name]   DOB:  05-28-1923   MRN:  [id number]   Chief Complaint: Follow-up (hypertension in ER)  Dizziness This is a recurrent problem. The current episode started more than 1 year ago. The problem occurs daily. The problem has been unchanged. Pertinent negatives include no abdominal pain, chest pain, chills, coughing, fatigue, fever, headaches, myalgias, nausea, neck pain, rash or sore throat. Nothing aggravates the symptoms.    Review of Systems  Constitutional: Negative for chills, fatigue and fever.  HENT: Negative for drooling, ear discharge, ear pain and sore throat.   Respiratory: Negative for cough, shortness of breath and wheezing.   Cardiovascular: Negative for chest pain, palpitations and leg swelling.  Gastrointestinal: Negative for abdominal pain, blood in stool, constipation, diarrhea and nausea.  Endocrine: Negative for polydipsia.  Genitourinary: Negative for dysuria, frequency, hematuria and urgency.  Musculoskeletal: Negative for back pain, myalgias and neck pain.  Skin: Negative for rash.  Allergic/Immunologic: Negative for environmental allergies.  Neurological: Positive for dizziness. Negative for headaches.  Hematological: Does not bruise/bleed easily.  Psychiatric/Behavioral: Negative for suicidal ideas. The patient is not nervous/anxious.     Patient Active Problem List   Diagnosis Date Noted  . Essential hypertension 10/22/2015  . Hyperlipidemia 10/22/2015  . Benign prostatic hyperplasia 10/22/2015    No Known Allergies  Past Surgical History:  Procedure Laterality Date  . EYE SURGERY     cataract extraction  . HERNIA REPAIR Left 2014   inguinal hernia  . PACEMAKER INSERTION Left 12/27/2017   Procedure: INSERTION PACEMAKER-INITIAL DUAL CHAMBER;  Surgeon: Isaias Cowman, MD;  Location: [facility] ORS;  Service: Cardiovascular;  Laterality: Left;    Social History   Tobacco Use  . Smoking status: Never Smoker   . Smokeless tobacco: Never Used  Substance Use Topics  . Alcohol use: No  . Drug use: No     Medication list has been reviewed and updated.  Current Meds  Medication Sig  . aspirin EC 81 MG tablet Take 81 mg by mouth daily.  . fluticasone (FLONASE) 50 MCG/ACT nasal spray Place 1 spray into both nostrils daily.  Marland Kitchen latanoprost (XALATAN) 0.005 % ophthalmic solution Place 1 drop into both eyes at bedtime.  Marland Kitchen losartan (COZAAR) 100 MG tablet Take 1 tablet by mouth daily. Dr [name]  . omeprazole (PRILOSEC) 20 MG capsule Take 1 capsule (20 mg total) by mouth daily.  . simvastatin (ZOCOR) 40 MG tablet Take 1 tablet (40 mg total) by mouth at bedtime.  . tamsulosin (FLOMAX) 0.4 MG CAPS capsule Take 1 capsule (0.4 mg total) by mouth daily.  . timolol (BETIMOL) 0.25 % ophthalmic solution Place 1 drop into both eyes 2 (two) times daily.    PHQ 2/9 Scores 09/14/2018 09/14/2018 03/21/2017 10/22/2015  PHQ - 2 Score 0 0 0 0  PHQ- 9 Score 0 0 0 -    BP Readings from Last 3 Encounters:  12/21/18 (!) 160/70  12/20/18 (!) 216/109  09/14/18 138/88    Physical Exam Vitals signs and nursing note reviewed.  HENT:     Head: Normocephalic.     Right Ear: Tympanic membrane, ear canal and external ear normal.     Left Ear: Tympanic membrane, ear canal and external ear normal.     Nose: Nose normal. No congestion or rhinorrhea.     Mouth/Throat:     Mouth: Mucous membranes are dry.     Pharynx: No oropharyngeal exudate or posterior  oropharyngeal erythema.  Eyes:     General: No scleral icterus.       Right eye: No discharge.        Left eye: No discharge.     Conjunctiva/sclera: Conjunctivae normal.     Pupils: Pupils are equal, round, and reactive to light.  Neck:     Musculoskeletal: Normal range of motion and neck supple.     Thyroid: No thyromegaly.     Vascular: No JVD.     Trachea: No tracheal deviation.  Cardiovascular:     Rate and Rhythm: Normal rate and regular rhythm.     Heart  sounds: Normal heart sounds. No murmur. No friction rub. No gallop.   Pulmonary:     Effort: No respiratory distress.     Breath sounds: Normal breath sounds. No wheezing or rales.  Abdominal:     General: Bowel sounds are normal.     Palpations: Abdomen is soft. There is no mass.     Tenderness: There is no abdominal tenderness. There is no guarding or rebound.  Musculoskeletal: Normal range of motion.        General: No tenderness.  Lymphadenopathy:     Cervical: No cervical adenopathy.  Skin:    General: Skin is warm.     Findings: No rash.  Neurological:     Mental Status: He is alert and oriented to person, place, and time.     Cranial Nerves: No cranial nerve deficit.     Deep Tendon Reflexes: Reflexes are normal and symmetric.     Wt Readings from Last 3 Encounters:  12/21/18 147 lb (66.7 kg)  12/20/18 155 lb (70.3 kg)  11/01/18 146 lb (66.2 kg)    BP (!) 160/70   Pulse 64   Ht 5\' 10"  (1.778 m)   Wt 147 lb (66.7 kg)   BMI 21.09 kg/m   Assessment and Plan:  1. 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  Couple of this with his daily dose of fat back is no wonder that there is some issues with dehydration as well as intermittent elevation of his blood pressure.  We have counseled patient on the importance of drinking fluids and having some cool time during the day so that he does not get overheated see below  2. Heat exhaustion, subsequent encounter Patient appears to me on review with probably to have had a mild case of heat exhaustion with elevation of blood pressure due to some anxiety of the dizziness that he had with this he has a blood pressure could be up a little bit but I think is a greater risk to drop the pressure given his age and the current hydration status then to  pursue an aggressive decrease.  Patient was re-counseled on sodium intake and to do things within moderation and not absolute elimination and that we will be glad to recheck him in the next couple weeks to see if things are improving.

## 2018-12-21 NOTE — Patient Instructions (Signed)
Preventing Heat Exhaustion, Adult Heat exhaustion happens when your body gets too hot (overheated) from hot weather or from exercise. Untreated heat exhaustion could lead to heat stroke. Heat stroke can be deadly. How can heat exhaustion affect me? Early warning signs of heat exhaustion are:  Weakness.  Fatigue.  Stomach cramps.  Arm pain.  Leg cramps. Later symptoms of heat exhaustion include:  Heavy sweating.  Clammy skin.  Rapid, weak pulse.  Nausea or vomiting.  Dizziness.  Headache.  Fainting. If you have signs and symptoms of heat exhaustion, move to a cool place, loosen your clothing, and drink water or a sports drink. Then, put cool, wet compresses on your body or get into a cool bath or shower. What can increase my risk? People who work or exercise outside in hot weather have the highest risk of heat exhaustion. You may also be at higher risk if you:  Are over age 65. Older adults have a greater risk for heat exhaustion than younger adults.  Are overweight.  Have high blood pressure.  Have heart disease. What actions can I take to prevent heat exhaustion?      Avoid being outside on very hot days. Check your local news for extreme heat alerts or warnings.  In extreme heat, stay in an air-conditioned environment until the temperature cools off.  Check with your health care provider before starting any new exercise or activity. Ask about any health conditions or medicines that might increase your risk for heat exhaustion.  Wear lightweight, light-colored, and loose-fitting clothing in warm weather.  Do outdoor activities when it is cooler. This may be in the morning, late afternoon, or evening. Take breaks in the shade.  Do not work or exercise in the heat when you feel unwell or have been sick.  Start any new work or exercise activity gradually.  Protect yourself from the sun by wearing a broad-brimmed hat and using at least SPF 15 broad-spectrum  sunscreen.  Drink enough water or sports drink to keep your urine pale yellow. When it is hot, drink every 15 to 20 minutes, even if you are not thirsty.  Do not go out in the heat after a heavy meal.  Do not drink alcohol or caffeinated drinks when it is very hot outside.  If you have friends or family members who are older adults: ? Do not leave an older adult alone in a hot car. ? Make sure older adults have access to air-conditioning on very hot days. Remind them to drink enough fluids. Check on them at least twice a day if you can. Where to find more information  Centers for Disease Control and Prevention (CDC): https://www.cdc.gov/disasters/extremeheat/warning.html  American Academy of Family Physicians (AAFP): https://familydoctor.org/condition/heat-exhaustion-heatstroke  American Academy of Orthopaedic Surgeons (AAOS): https://orthoinfo.aaos.org/en/diseases--conditions/heat-injury-and-heat-exhaustion Contact a health care provider if:  You faint.  You feel weak or dizzy.  You have any signs or symptoms of heat exhaustion that last more than one hour. Get help right away if you have signs of heat stroke:  Body temperature of 103F (39.4C) or higher.  Hot, dry, red skin.  Fast, thumping pulse.  Confusion.  Loss of consciousness. Summary  Heat exhaustion happens when your body gets overheated and cannot cool down.  Avoid being outside on very hot days. Check your local news for extreme heat alerts or warnings. When you are out in the heat, take steps to protect yourself from the sun and stay hydrated.  If you have signs or symptoms of heat   exhaustion, get out of the heat, drink fluids, take steps to cool down, and contact a health care provider.  Get help right away if you have signs or symptoms of heat stroke. This information is not intended to replace advice given to you by your health care provider. Make sure you discuss any questions you have with your health  care provider. Document Released: 08/17/2017 Document Revised: 08/17/2017 Document Reviewed: 08/17/2017 Elsevier Patient Education  2020 [employer].

## 2018-12-22 ENCOUNTER — Ambulatory Visit: Payer: Self-pay | Admitting: Family Medicine

## 2019-02-01 ENCOUNTER — Telehealth: Payer: Self-pay | Admitting: Family Medicine

## 2019-02-01 ENCOUNTER — Other Ambulatory Visit: Payer: Self-pay

## 2019-02-01 DIAGNOSIS — K219 Gastro-esophageal reflux disease without esophagitis: Secondary | ICD-10-CM

## 2019-02-01 DIAGNOSIS — E785 Hyperlipidemia, unspecified: Secondary | ICD-10-CM

## 2019-02-01 DIAGNOSIS — N401 Enlarged prostate with lower urinary tract symptoms: Secondary | ICD-10-CM

## 2019-02-01 MED ORDER — OMEPRAZOLE 20 MG PO CPDR: 20.0000 mg | DELAYED_RELEASE_CAPSULE | Freq: Every day | ORAL | 0 refills | Status: DC

## 2019-02-01 MED ORDER — SIMVASTATIN 40 MG PO TABS: 40.0000 mg | ORAL_TABLET | Freq: Every day | ORAL | 0 refills | Status: DC

## 2019-02-01 MED ORDER — TAMSULOSIN HCL 0.4 MG PO CAPS: 0.4000 mg | ORAL_CAPSULE | Freq: Every day | ORAL | 0 refills | Status: DC

## 2019-02-01 NOTE — Telephone Encounter (Signed)
Pt need refills sent [facility]   losartan (COZAAR) 100 MG tablet [id number]   omeprazole (PRILOSEC) 20 MG capsule [id number]    simvastatin (ZOCOR) 40 MG tablet [id number]    tamsulosin (FLOMAX) 0.4 MG CAPS capsule [id number]

## 2019-02-03 ENCOUNTER — Other Ambulatory Visit: Payer: Self-pay | Admitting: Family Medicine

## 2019-02-03 DIAGNOSIS — E785 Hyperlipidemia, unspecified: Secondary | ICD-10-CM

## 2019-02-03 DIAGNOSIS — N401 Enlarged prostate with lower urinary tract symptoms: Secondary | ICD-10-CM

## 2019-02-03 DIAGNOSIS — K219 Gastro-esophageal reflux disease without esophagitis: Secondary | ICD-10-CM

## 2019-02-07 ENCOUNTER — Ambulatory Visit (INDEPENDENT_AMBULATORY_CARE_PROVIDER_SITE_OTHER): Payer: Medicare HMO

## 2019-02-07 ENCOUNTER — Other Ambulatory Visit: Payer: Self-pay

## 2019-02-07 DIAGNOSIS — Z23 Encounter for immunization: Secondary | ICD-10-CM | POA: Diagnosis not present

## 2019-02-27 DIAGNOSIS — I5022 Chronic systolic (congestive) heart failure: Secondary | ICD-10-CM | POA: Diagnosis not present

## 2019-03-07 ENCOUNTER — Ambulatory Visit (INDEPENDENT_AMBULATORY_CARE_PROVIDER_SITE_OTHER): Payer: Medicare HMO | Admitting: Family Medicine

## 2019-03-07 ENCOUNTER — Other Ambulatory Visit: Payer: Self-pay

## 2019-03-07 ENCOUNTER — Encounter: Payer: Self-pay | Admitting: Family Medicine

## 2019-03-07 VITALS — BP 130/68 | HR 68 | Ht 70.0 in | Wt 147.0 lb

## 2019-03-07 DIAGNOSIS — I10 Essential (primary) hypertension: Secondary | ICD-10-CM

## 2019-03-07 NOTE — Progress Notes (Signed)
Date:  03/07/2019   Name:  Eric [name]   DOB:  1923/06/25   MRN:  [id number]   Chief Complaint: Follow-up (b/p check)  Hypertension This is a chronic problem. The current episode started more than 1 year ago. The problem has been gradually improving since onset. The problem is controlled. Pertinent negatives include no anxiety, blurred vision, chest pain, headaches, malaise/fatigue, neck pain, orthopnea, palpitations, peripheral edema, PND, shortness of breath or sweats. The current treatment provides moderate improvement. There are no compliance problems.  Hypertensive end-organ damage includes heart failure. There is no history of angina, kidney disease, CAD/MI, CVA, left ventricular hypertrophy or retinopathy. There is no history of chronic renal disease, a hypertension causing med or renovascular disease.    Review of Systems  Constitutional: Negative for chills, fever and malaise/fatigue.  HENT: Negative for drooling, ear discharge, ear pain and sore throat.   Eyes: Negative for blurred vision.  Respiratory: Negative for cough, shortness of breath and wheezing.   Cardiovascular: Negative for chest pain, palpitations, orthopnea, leg swelling and PND.  Gastrointestinal: Negative for abdominal pain, blood in stool, constipation, diarrhea and nausea.  Endocrine: Negative for polydipsia.  Genitourinary: Negative for dysuria, frequency, hematuria and urgency.  Musculoskeletal: Negative for back pain, myalgias and neck pain.  Skin: Negative for rash.  Allergic/Immunologic: Negative for environmental allergies.  Neurological: Negative for dizziness and headaches.  Hematological: Does not bruise/bleed easily.  Psychiatric/Behavioral: Negative for suicidal ideas. The patient is not nervous/anxious.     Patient Active Problem List   Diagnosis Date Noted  . Essential hypertension 10/22/2015  . Hyperlipidemia 10/22/2015  . Benign prostatic hyperplasia 10/22/2015    No Known  Allergies  Past Surgical History:  Procedure Laterality Date  . EYE SURGERY     cataract extraction  . HERNIA REPAIR Left 2014   inguinal hernia  . PACEMAKER INSERTION Left 12/27/2017   Procedure: INSERTION PACEMAKER-INITIAL DUAL CHAMBER;  Surgeon: Isaias Cowman, MD;  Location: [facility] ORS;  Service: Cardiovascular;  Laterality: Left;    Social History   Tobacco Use  . Smoking status: Never Smoker  . Smokeless tobacco: Never Used  Substance Use Topics  . Alcohol use: No  . Drug use: No     Medication list has been reviewed and updated.  Current Meds  Medication Sig  . latanoprost (XALATAN) 0.005 % ophthalmic solution Place 1 drop into both eyes at bedtime.  Marland Kitchen losartan (COZAAR) 100 MG tablet Take 1 tablet by mouth daily. Dr [name]  . omeprazole (PRILOSEC) 20 MG capsule TAKE 1 CAPSULE EVERY DAY  . simvastatin (ZOCOR) 40 MG tablet TAKE 1 TABLET AT BEDTIME  . tamsulosin (FLOMAX) 0.4 MG CAPS capsule TAKE 1 CAPSULE EVERY DAY  . timolol (BETIMOL) 0.25 % ophthalmic solution Place 1 drop into both eyes 2 (two) times daily.    PHQ 2/9 Scores 03/07/2019 09/14/2018 09/14/2018 03/21/2017  PHQ - 2 Score 0 0 0 0  PHQ- 9 Score 0 0 0 0    BP Readings from Last 3 Encounters:  03/07/19 130/68  12/21/18 (!) 160/70  12/20/18 (!) 216/109    Physical Exam Vitals signs and nursing note reviewed.  HENT:     Head: Normocephalic.     Right Ear: Tympanic membrane, ear canal and external ear normal.     Left Ear: Tympanic membrane, ear canal and external ear normal.     Nose: Nose normal. No congestion.  Eyes:     General: No scleral icterus.  Right eye: No discharge.        Left eye: No discharge.     Conjunctiva/sclera: Conjunctivae normal.     Pupils: Pupils are equal, round, and reactive to light.  Neck:     Musculoskeletal: Normal range of motion and neck supple.     Thyroid: No thyromegaly.     Vascular: No JVD.     Trachea: No tracheal deviation.  Cardiovascular:      Rate and Rhythm: Normal rate and regular rhythm.     Heart sounds: Normal heart sounds. No murmur. No friction rub. No gallop.   Pulmonary:     Effort: No respiratory distress.     Breath sounds: Normal breath sounds. No wheezing, rhonchi or rales.  Abdominal:     General: Bowel sounds are normal.     Palpations: Abdomen is soft. There is no mass.     Tenderness: There is no abdominal tenderness. There is no guarding or rebound.  Musculoskeletal: Normal range of motion.        General: No tenderness.  Lymphadenopathy:     Cervical: No cervical adenopathy.  Skin:    General: Skin is warm.     Findings: No rash.  Neurological:     Mental Status: He is alert and oriented to person, place, and time.     Cranial Nerves: No cranial nerve deficit.     Deep Tendon Reflexes: Reflexes are normal and symmetric.     Wt Readings from Last 3 Encounters:  03/07/19 147 lb (66.7 kg)  12/21/18 147 lb (66.7 kg)  12/20/18 155 lb (70.3 kg)    BP 130/68   Pulse 68   Ht 5\' 10"  (1.778 m)   Wt 147 lb (66.7 kg)   BMI 21.09 kg/m   Assessment and Plan:  1. Essential hypertension Chronic.  Controlled.  On review the last note from Dr. Jose Persia it was noted that patient had his hydrochlorothiazide discontinued due to dehydration and losartan was continued at 100 mg daily.  Patient's caretaker seems to remember that they have received a new order of medications and hopefully this is the patient's losartan and will not need to be refilled.  Patient's family will call back if there is any concerns or if we need to become more involved with the ordering of the medication.

## 2019-04-02 DIAGNOSIS — H40153 Residual stage of open-angle glaucoma, bilateral: Secondary | ICD-10-CM | POA: Diagnosis not present

## 2019-05-24 DIAGNOSIS — Z950 Presence of cardiac pacemaker: Secondary | ICD-10-CM | POA: Diagnosis not present

## 2019-05-24 DIAGNOSIS — I10 Essential (primary) hypertension: Secondary | ICD-10-CM | POA: Diagnosis not present

## 2019-05-24 DIAGNOSIS — E782 Mixed hyperlipidemia: Secondary | ICD-10-CM | POA: Diagnosis not present

## 2019-05-24 DIAGNOSIS — R001 Bradycardia, unspecified: Secondary | ICD-10-CM | POA: Diagnosis not present

## 2019-06-08 ENCOUNTER — Other Ambulatory Visit: Payer: Self-pay | Admitting: Family Medicine

## 2019-06-08 DIAGNOSIS — E785 Hyperlipidemia, unspecified: Secondary | ICD-10-CM

## 2019-06-08 DIAGNOSIS — N401 Enlarged prostate with lower urinary tract symptoms: Secondary | ICD-10-CM

## 2019-06-08 DIAGNOSIS — K219 Gastro-esophageal reflux disease without esophagitis: Secondary | ICD-10-CM

## 2019-07-02 DIAGNOSIS — I10 Essential (primary) hypertension: Secondary | ICD-10-CM | POA: Diagnosis not present

## 2019-07-02 DIAGNOSIS — E782 Mixed hyperlipidemia: Secondary | ICD-10-CM | POA: Diagnosis not present

## 2019-07-02 DIAGNOSIS — R001 Bradycardia, unspecified: Secondary | ICD-10-CM | POA: Diagnosis not present

## 2019-07-02 DIAGNOSIS — Z950 Presence of cardiac pacemaker: Secondary | ICD-10-CM | POA: Diagnosis not present

## 2019-07-10 DIAGNOSIS — I5022 Chronic systolic (congestive) heart failure: Secondary | ICD-10-CM | POA: Diagnosis not present

## 2019-07-14 ENCOUNTER — Ambulatory Visit: Payer: Medicare HMO | Attending: Internal Medicine

## 2019-07-14 DIAGNOSIS — Z23 Encounter for immunization: Secondary | ICD-10-CM

## 2019-07-14 NOTE — Progress Notes (Signed)
   Covid-19 Vaccination Clinic  Name:  Eric [name]    MRN: [id number] DOB: 1923/10/29  07/14/2019  [name] was observed post Covid-19 immunization for 15 minutes without incidence. He was provided with Vaccine Information Sheet and instruction to access the V-Safe system.   [name] was instructed to call 911 with any severe reactions post vaccine: Marland Kitchen Difficulty breathing  . Swelling of your face and throat  . A fast heartbeat  . A bad rash all over your body  . Dizziness and weakness    Immunizations Administered    Name Date Dose VIS Date Route   Pfizer COVID-19 Vaccine 07/14/2019  2:43 PM 0.3 mL 05/04/2019 Intramuscular   Manufacturer: [place]   Lot: [id number]   [place]: [id number]

## 2019-08-07 ENCOUNTER — Ambulatory Visit: Payer: Medicare HMO | Attending: Internal Medicine

## 2019-08-07 DIAGNOSIS — Z23 Encounter for immunization: Secondary | ICD-10-CM

## 2019-08-07 NOTE — Progress Notes (Signed)
   Covid-19 Vaccination Clinic  Name:  Eric [name]    MRN: [id number] DOB: 07/20/1923  08/07/2019  Mr. Eric [name] was observed post Covid-19 immunization for 15 minutes without incident. He was provided with Vaccine Information Sheet and instruction to access the V-Safe system.   Mr. Eric [name] was instructed to call 911 with any severe reactions post vaccine: Marland Kitchen Difficulty breathing  . Swelling of face and throat  . A fast heartbeat  . A bad rash all over body  . Dizziness and weakness   Immunizations Administered    Name Date Dose VIS Date Route   Pfizer COVID-19 Vaccine 08/07/2019 10:17 AM 0.3 mL 05/04/2019 Intramuscular   Manufacturer: [place]   Lot: [id number]   [place]: [id number]

## 2019-08-16 ENCOUNTER — Other Ambulatory Visit: Payer: Self-pay | Admitting: Family Medicine

## 2019-08-16 DIAGNOSIS — N401 Enlarged prostate with lower urinary tract symptoms: Secondary | ICD-10-CM

## 2019-08-16 DIAGNOSIS — E785 Hyperlipidemia, unspecified: Secondary | ICD-10-CM

## 2019-08-16 DIAGNOSIS — K219 Gastro-esophageal reflux disease without esophagitis: Secondary | ICD-10-CM

## 2019-08-30 ENCOUNTER — Ambulatory Visit: Payer: Medicare HMO | Admitting: Family Medicine

## 2019-09-03 ENCOUNTER — Ambulatory Visit (INDEPENDENT_AMBULATORY_CARE_PROVIDER_SITE_OTHER): Payer: Medicare HMO | Admitting: Family Medicine

## 2019-09-03 ENCOUNTER — Encounter: Payer: Self-pay | Admitting: Family Medicine

## 2019-09-03 ENCOUNTER — Other Ambulatory Visit: Payer: Self-pay

## 2019-09-03 VITALS — BP 130/62 | HR 60 | Ht 70.0 in | Wt 153.0 lb

## 2019-09-03 DIAGNOSIS — N401 Enlarged prostate with lower urinary tract symptoms: Secondary | ICD-10-CM | POA: Diagnosis not present

## 2019-09-03 DIAGNOSIS — E785 Hyperlipidemia, unspecified: Secondary | ICD-10-CM | POA: Diagnosis not present

## 2019-09-03 DIAGNOSIS — K219 Gastro-esophageal reflux disease without esophagitis: Secondary | ICD-10-CM | POA: Diagnosis not present

## 2019-09-03 DIAGNOSIS — I10 Essential (primary) hypertension: Secondary | ICD-10-CM

## 2019-09-03 MED ORDER — TAMSULOSIN HCL 0.4 MG PO CAPS: 0.4000 mg | ORAL_CAPSULE | Freq: Every day | ORAL | 1 refills | Status: DC

## 2019-09-03 MED ORDER — OMEPRAZOLE 20 MG PO CPDR: 20.0000 mg | DELAYED_RELEASE_CAPSULE | Freq: Every day | ORAL | 1 refills | Status: DC

## 2019-09-03 MED ORDER — SIMVASTATIN 40 MG PO TABS: 40.0000 mg | ORAL_TABLET | Freq: Every day | ORAL | 1 refills | Status: DC

## 2019-09-03 NOTE — Progress Notes (Signed)
Date:  09/03/2019   Name:  Eric [name]   DOB:  [birth year]   MRN:  [id number]   Chief Complaint: Gastroesophageal Reflux (pt is aware and approves of Eric [name] being in room), Hyperlipidemia, and Benign Prostatic Hypertrophy  Gastroesophageal Reflux He reports no abdominal pain, no belching, no chest pain, no choking, no coughing, no dysphagia, no early satiety, no globus sensation, no heartburn, no hoarse voice, no nausea, no sore throat, no stridor, no water brash or no wheezing. This is a chronic problem. The current episode started more than 1 year ago. The problem occurs frequently. The problem has been gradually improving. The symptoms are aggravated by certain foods. Pertinent negatives include no anemia, fatigue, melena, muscle weakness or orthopnea. There are no known risk factors. He has tried a PPI for the symptoms. The treatment provided moderate relief.  Hyperlipidemia This is a chronic problem. The current episode started more than 1 year ago. The problem is controlled. Recent lipid tests were reviewed and are variable. He has no history of chronic renal disease, diabetes or hypothyroidism. Pertinent negatives include no chest pain, leg pain, myalgias or shortness of breath. Current antihyperlipidemic treatment includes statins (off medication for uncertain time). The current treatment provides mild improvement of lipids. There are no compliance problems.  Risk factors for coronary artery disease include hypertension, dyslipidemia, male sex and post-menopausal.  Benign Prostatic Hypertrophy This is a chronic problem. The current episode started more than 1 year ago. The problem is unchanged. Irritative symptoms do not include frequency, nocturia or urgency. Obstructive symptoms do not include dribbling, incomplete emptying, an intermittent stream, a slower stream, straining or a weak stream. Associated symptoms include dysuria. Pertinent negatives include no chills, hematuria or nausea.     Lab Results  Component Value Date   CREATININE 0.99 12/20/2018   BUN 26 (H) 12/20/2018   NA 139 12/20/2018   K 3.9 12/20/2018   CL 106 12/20/2018   CO2 27 12/20/2018   Lab Results  Component Value Date   CHOL 186 09/14/2018   HDL 86 09/14/2018   LDLCALC 91 09/14/2018   TRIG 43 09/14/2018   CHOLHDL 2.5 08/24/2017   No results found for: TSH No results found for: HGBA1C Lab Results  Component Value Date   WBC 3.8 (L) 12/20/2018   HGB 11.4 (L) 12/20/2018   HCT 35.1 (L) 12/20/2018   MCV 91.2 12/20/2018   PLT 197 12/20/2018   Lab Results  Component Value Date   ALT 14 12/20/2018   AST 18 12/20/2018   ALKPHOS 35 (L) 12/20/2018   BILITOT 0.7 12/20/2018     Review of Systems  Constitutional: Negative for chills, fatigue and fever.  HENT: Negative for drooling, ear discharge, ear pain, hoarse voice and sore throat.   Respiratory: Negative for cough, choking, shortness of breath and wheezing.   Cardiovascular: Negative for chest pain, palpitations and leg swelling.  Gastrointestinal: Negative for abdominal pain, blood in stool, constipation, diarrhea, dysphagia, heartburn, melena and nausea.  Endocrine: Negative for polydipsia.  Genitourinary: Positive for dysuria. Negative for frequency, hematuria, incomplete emptying, nocturia and urgency.  Musculoskeletal: Negative for back pain, myalgias, muscle weakness and neck pain.  Skin: Negative for rash.  Allergic/Immunologic: Negative for environmental allergies.  Neurological: Negative for dizziness and headaches.  Hematological: Does not bruise/bleed easily.  Psychiatric/Behavioral: Negative for suicidal ideas. The patient is not nervous/anxious.     Patient Active Problem List   Diagnosis Date Noted  . Essential hypertension 10/22/2015  .  Hyperlipidemia 10/22/2015  . Benign prostatic hyperplasia 10/22/2015    No Known Allergies  Past Surgical History:  Procedure Laterality Date  . EYE SURGERY     cataract  extraction  . HERNIA REPAIR Left 2014   inguinal hernia  . PACEMAKER INSERTION Left 12/27/2017   Procedure: INSERTION PACEMAKER-INITIAL DUAL CHAMBER;  Surgeon: Eric Cowman, MD;  Location: [facility] ORS;  Service: Cardiovascular;  Laterality: Left;    Social History   Tobacco Use  . Smoking status: Never Smoker  . Smokeless tobacco: Never Used  Substance Use Topics  . Alcohol use: No  . Drug use: No     Medication list has been reviewed and updated.  Current Meds  Medication Sig  . amLODipine (NORVASC) 5 MG tablet Take 1 tablet by mouth daily. Eric [name]  . aspirin EC 81 MG tablet Take 81 mg by mouth daily.  . brimonidine (ALPHAGAN) 0.2 % ophthalmic solution   . fluticasone (FLONASE) 50 MCG/ACT nasal spray Place 1 spray into both nostrils daily.  Marland Kitchen latanoprost (XALATAN) 0.005 % ophthalmic solution Place 1 drop into both eyes at bedtime.  Marland Kitchen losartan (COZAAR) 100 MG tablet Take 1 tablet by mouth daily. Eric [name]  . omeprazole (PRILOSEC) 20 MG capsule TAKE 1 CAPSULE EVERY DAY  . simvastatin (ZOCOR) 40 MG tablet TAKE 1 TABLET AT BEDTIME  . tamsulosin (FLOMAX) 0.4 MG CAPS capsule TAKE 1 CAPSULE EVERY DAY  . timolol (BETIMOL) 0.25 % ophthalmic solution Place 1 drop into both eyes 2 (two) times daily.    PHQ 2/9 Scores 09/03/2019 03/07/2019 09/14/2018 09/14/2018  PHQ - 2 Score 0 0 0 0  PHQ- 9 Score 0 0 0 0    BP Readings from Last 3 Encounters:  09/03/19 130/62  03/07/19 130/68  12/21/18 (!) 160/70    Physical Exam Vitals and nursing note reviewed.  HENT:     Head: Normocephalic.     Right Ear: Tympanic membrane, ear canal and external ear normal.     Left Ear: Tympanic membrane, ear canal and external ear normal.     Nose: Nose normal.  Eyes:     General: No scleral icterus.       Right eye: No discharge.        Left eye: No discharge.     Conjunctiva/sclera: Conjunctivae normal.     Pupils: Pupils are equal, round, and reactive to light.  Neck:     Thyroid: No  thyromegaly.     Vascular: No JVD.     Trachea: No tracheal deviation.  Cardiovascular:     Rate and Rhythm: Normal rate and regular rhythm.     Heart sounds: Normal heart sounds and S1 normal. No murmur. No diastolic murmur. No friction rub. No gallop. No S3 or S4 sounds.   Pulmonary:     Effort: No respiratory distress.     Breath sounds: Normal breath sounds. No wheezing or rales.  Abdominal:     General: Bowel sounds are normal.     Palpations: Abdomen is soft. There is no hepatomegaly, splenomegaly or mass.     Tenderness: There is no abdominal tenderness. There is no right CVA tenderness, left CVA tenderness, guarding or rebound.  Musculoskeletal:        General: No tenderness. Normal range of motion.     Cervical back: Normal range of motion and neck supple.     Right lower leg: No edema.     Left lower leg: No edema.  Lymphadenopathy:  Cervical: No cervical adenopathy.  Skin:    General: Skin is warm.     Findings: No rash.  Neurological:     Mental Status: He is alert and oriented to person, place, and time.     Cranial Nerves: No cranial nerve deficit.     Deep Tendon Reflexes: Reflexes are normal and symmetric.     Wt Readings from Last 3 Encounters:  09/03/19 153 lb (69.4 kg)  03/07/19 147 lb (66.7 kg)  12/21/18 147 lb (66.7 kg)    BP 130/62   Pulse 60   Ht 5\' 10"  (1.778 m)   Wt 153 lb (69.4 kg)   BMI 21.95 kg/m   Assessment and Plan:  1. Gastroesophageal reflux disease Chronic.  Controlled.  Stable.  Continue omeprazole 20 mg once a day. - omeprazole (PRILOSEC) 20 MG capsule; Take 1 capsule (20 mg total) by mouth daily.  Dispense: 90 capsule; Refill: 1  2. Hyperlipidemia, unspecified hyperlipidemia type Chronic.  Controlled.  Stable.  Continue simvastatin 40 mg once a day.  Will check lipid panel for LDH control and comprehensive metabolic panel to rule out hepatotoxicity. - Comprehensive Metabolic Panel (CMET) - Lipid Panel With LDL/HDL Ratio -  simvastatin (ZOCOR) 40 MG tablet; Take 1 tablet (40 mg total) by mouth at bedtime.  Dispense: 90 tablet; Refill: 1  3. Benign prostatic hyperplasia with lower urinary tract symptoms, symptom details unspecified Chronic.  Controlled.  Stable.  Patient has no signs symptoms of enlargement progression.  Continue Flomax 0.4 mg daily and will check PSA for changes. - PSA - tamsulosin (FLOMAX) 0.4 MG CAPS capsule; Take 1 capsule (0.4 mg total) by mouth daily.  Dispense: 90 capsule; Refill: 1  4. Essential hypertension Chronic.  Controlled.  Stable.  Continue avoidance of sodium and will check comprehensive metabolic panel for GFR. - Comprehensive Metabolic Panel (CMET)

## 2019-09-04 LAB — COMPREHENSIVE METABOLIC PANEL
ALT: 13 IU/L (ref 0–44)
AST: 18 IU/L (ref 0–40)
Albumin/Globulin Ratio: 1.4 (ref 1.2–2.2)
Albumin: 3.8 g/dL (ref 3.5–4.6)
Alkaline Phosphatase: 46 IU/L (ref 39–117)
BUN/Creatinine Ratio: 20 (ref 10–24)
BUN: 23 mg/dL (ref 10–36)
Bilirubin Total: 0.3 mg/dL (ref 0.0–1.2)
CO2: 24 mmol/L (ref 20–29)
Calcium: 9 mg/dL (ref 8.6–10.2)
Chloride: 103 mmol/L (ref 96–106)
Creatinine, Ser: 1.14 mg/dL (ref 0.76–1.27)
GFR calc Af Amer: 64 mL/min/{1.73_m2} (ref >59)
GFR calc non Af Amer: 55 mL/min/{1.73_m2} — ABNORMAL LOW (ref >59)
Globulin, Total: 2.8 g/dL (ref 1.5–4.5)
Glucose: 96 mg/dL (ref 65–99)
Potassium: 4.3 mmol/L (ref 3.5–5.2)
Sodium: 139 mmol/L (ref 134–144)
Total Protein: 6.6 g/dL (ref 6.0–8.5)

## 2019-09-04 LAB — LIPID PANEL WITH LDL/HDL RATIO
Cholesterol, Total: 241 mg/dL — ABNORMAL HIGH (ref 100–199)
HDL: 71 mg/dL (ref >39)
LDL Chol Calc (NIH): 149 mg/dL — ABNORMAL HIGH (ref 0–99)
LDL/HDL Ratio: 2.1 ratio (ref 0.0–3.6)
Triglycerides: 122 mg/dL (ref 0–149)
VLDL Cholesterol Cal: 21 mg/dL (ref 5–40)

## 2019-09-04 LAB — PSA: Prostate Specific Ag, Serum: 2.2 ng/mL (ref 0.0–4.0)

## 2019-10-03 IMAGING — CR DG CHEST 2V
2 series · 2 of 2 positions shown · non-contrast
Comparison: Chest x-ray of November 01, 2011

CLINICAL DATA: Preoperative examination prior pacemaker insertion
next week. History of hypertension, nonsmoker.

EXAM:
CHEST - 2 VIEW

[chest pa]
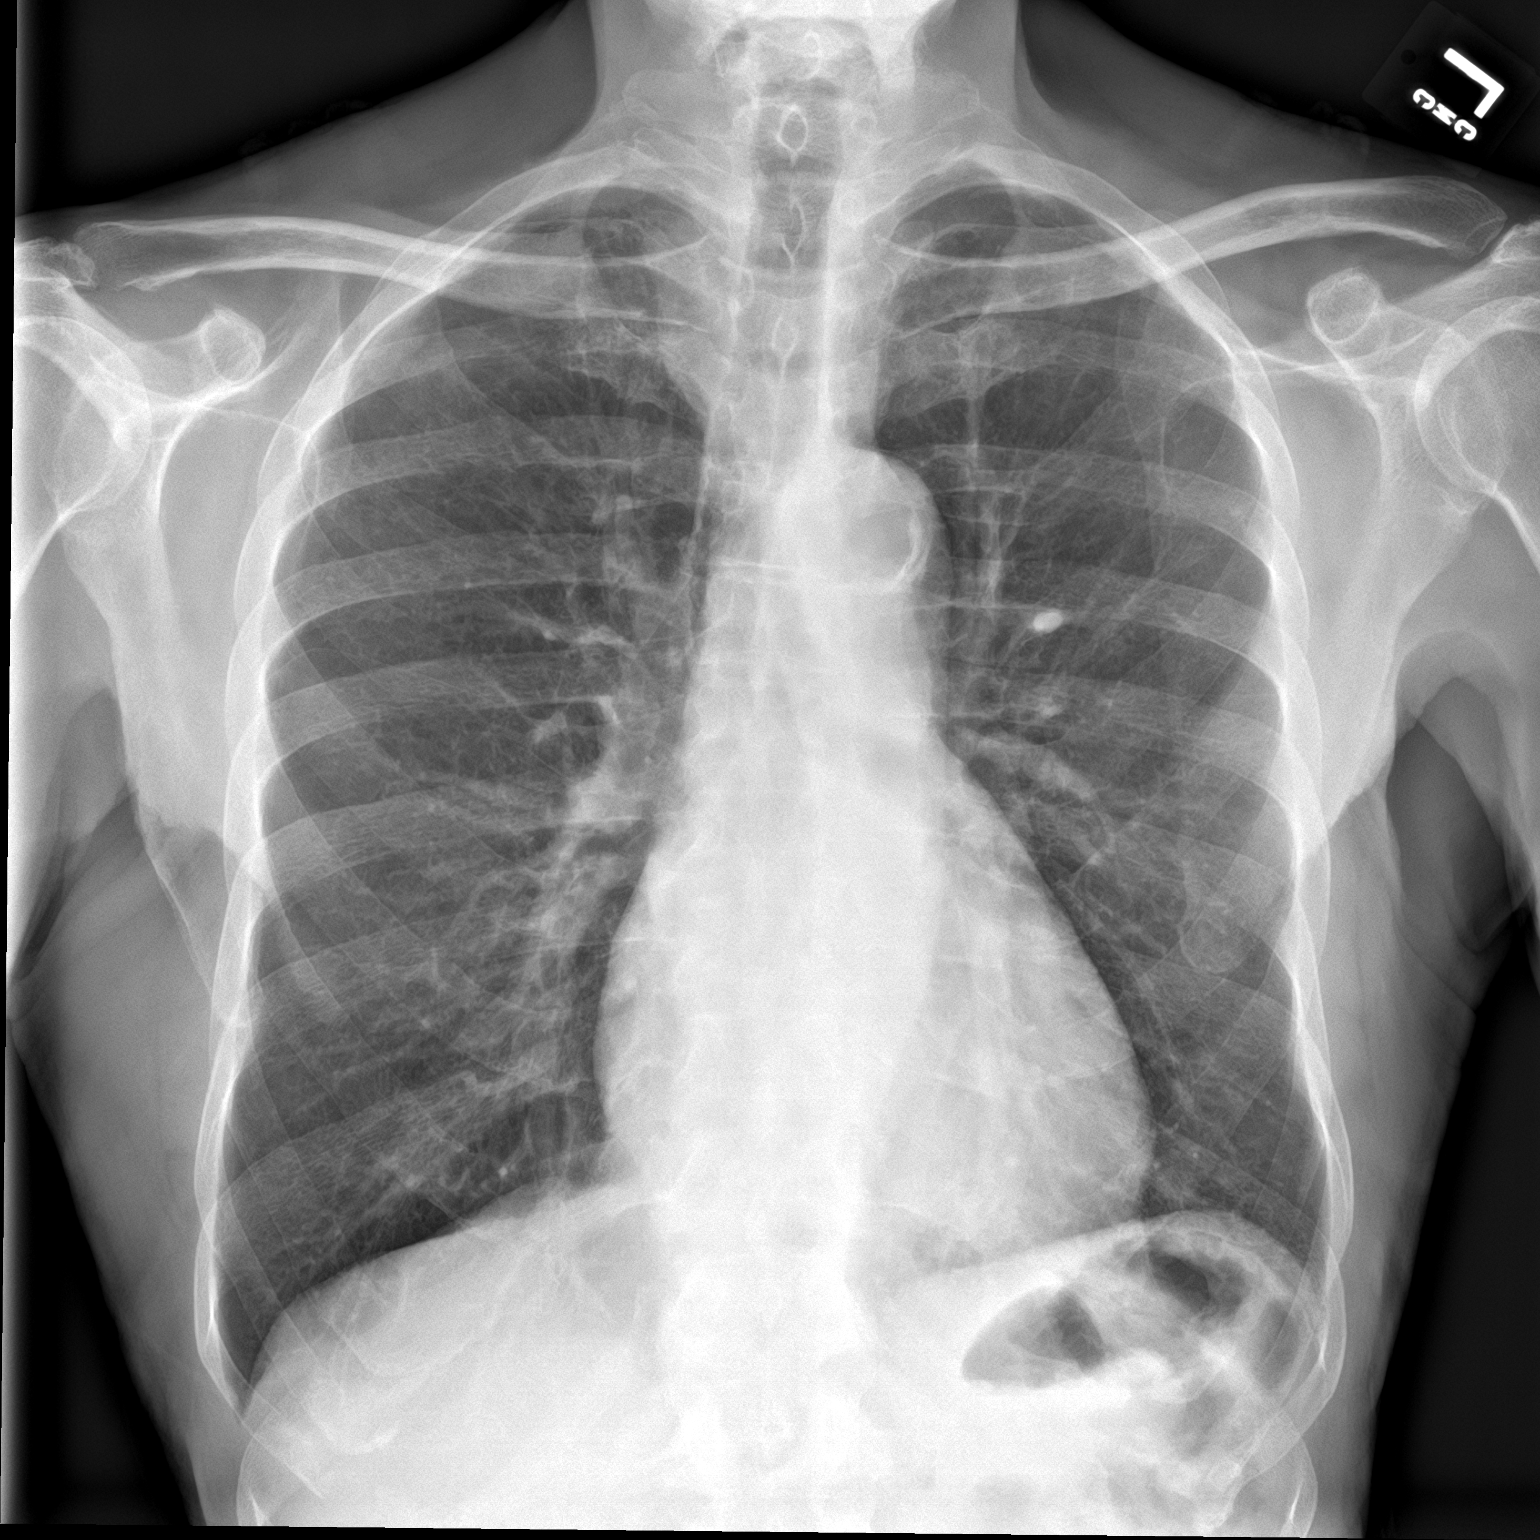

[chest lat]
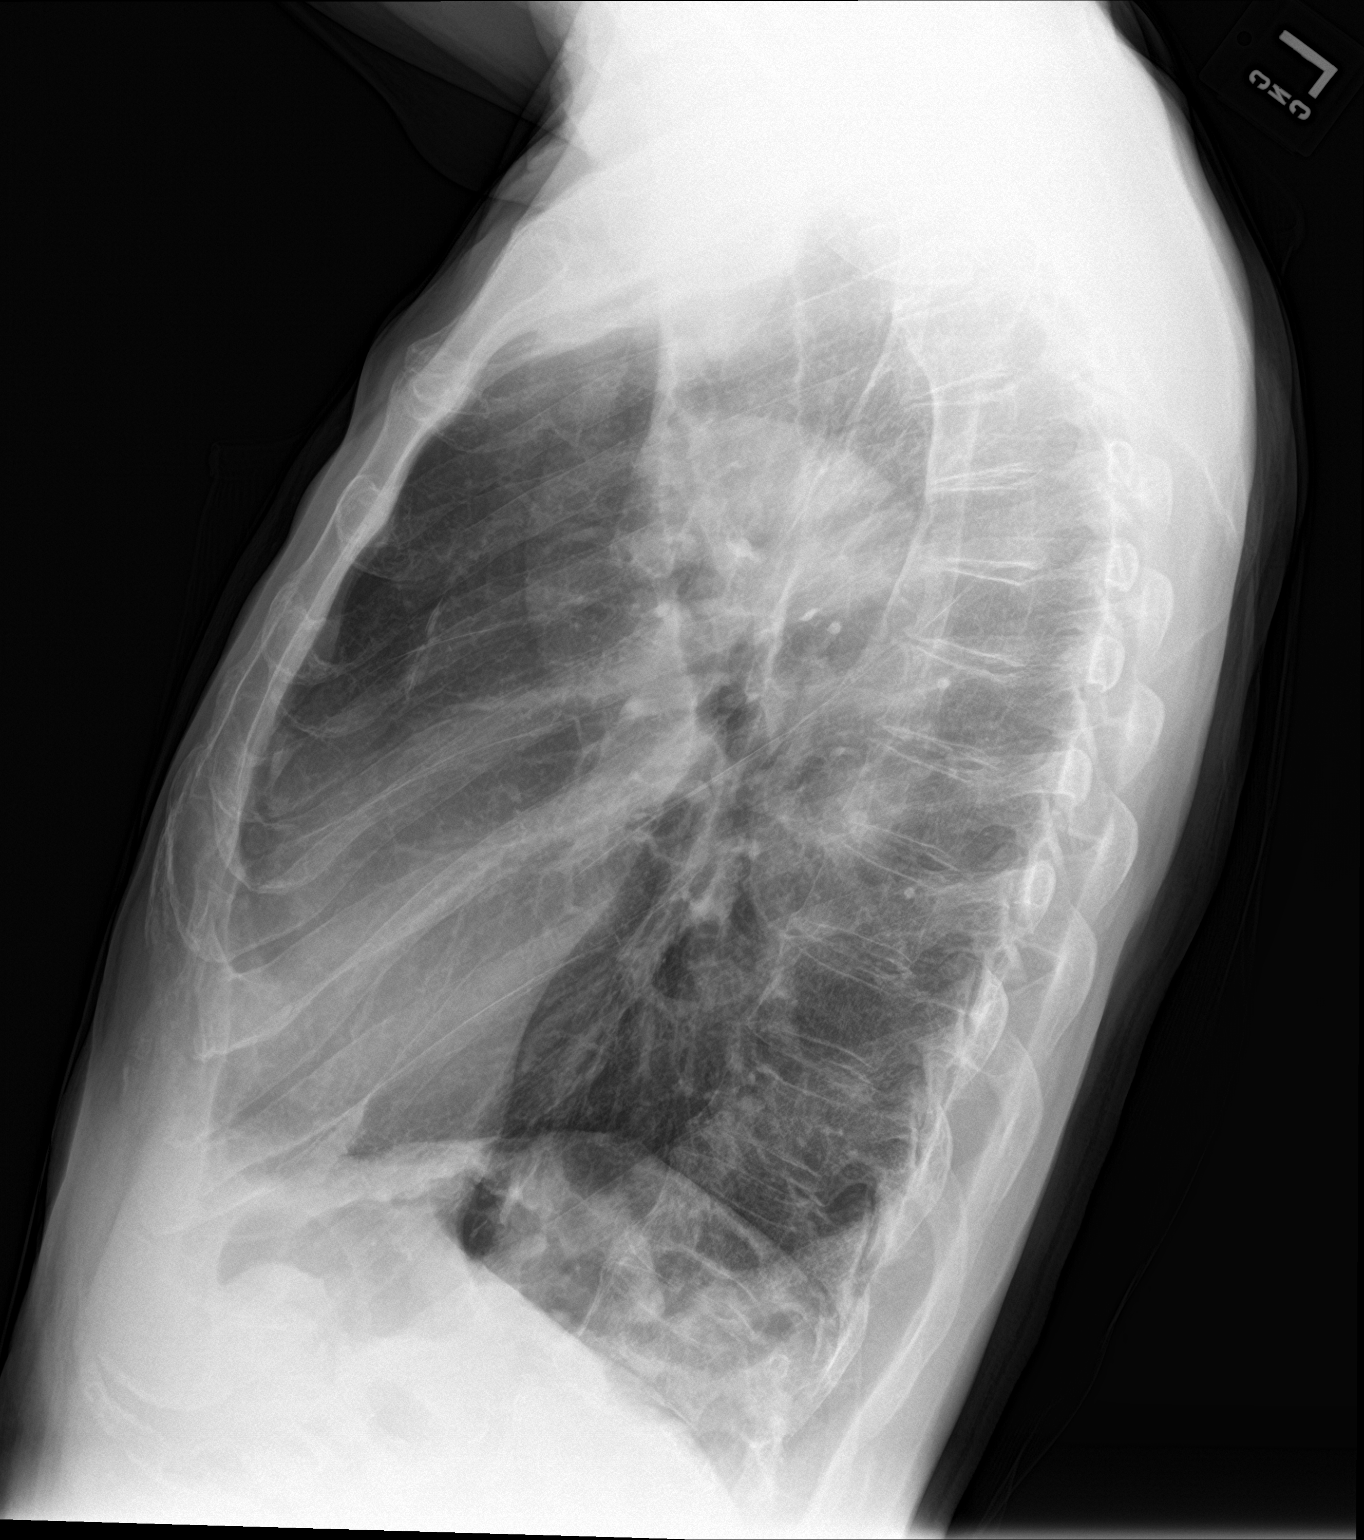

[2 of 2 positions shown; findings below may reference images not displayed]

FINDINGS: The lungs are mildly hyperinflated. There is no focal infiltrate.
There is no significant hemidiaphragm flattening. There is no
pleural effusion. The heart and pulmonary vascularity are normal.
There is calcification in the wall of the aortic arch. The bony
thorax exhibits no acute abnormality.
IMPRESSION: There is no active cardiopulmonary disease. Mild hyperinflation
likely reflects chronic bronchitis.

Thoracic aortic atherosclerosis.

## 2019-10-15 ENCOUNTER — Other Ambulatory Visit: Payer: Self-pay | Admitting: Family Medicine

## 2019-10-15 DIAGNOSIS — E785 Hyperlipidemia, unspecified: Secondary | ICD-10-CM

## 2019-10-15 MED ORDER — SIMVASTATIN 40 MG PO TABS: 40.0000 mg | ORAL_TABLET | Freq: Every day | ORAL | 1 refills | Status: DC

## 2019-10-15 NOTE — Telephone Encounter (Signed)
Medication: simvastatin (ZOCOR) 40 MG tablet [id number]   Has the patient contacted their pharmacy? Yes  (Agent: If no, request that the patient contact the pharmacy for the refill.) (Agent: If yes, when and what did the pharmacy advise?)  Preferred Pharmacy (with phone number or street name): [place], [place]  Phone:  [phone] Fax:  [phone]     Agent: Please be advised that RX refills may take up to 3 business days. We ask that you follow-up with your pharmacy.

## 2019-10-22 ENCOUNTER — Other Ambulatory Visit: Payer: Self-pay | Admitting: Family Medicine

## 2019-10-22 DIAGNOSIS — N401 Enlarged prostate with lower urinary tract symptoms: Secondary | ICD-10-CM

## 2019-10-22 DIAGNOSIS — K219 Gastro-esophageal reflux disease without esophagitis: Secondary | ICD-10-CM

## 2019-10-22 DIAGNOSIS — E785 Hyperlipidemia, unspecified: Secondary | ICD-10-CM

## 2019-11-02 ENCOUNTER — Ambulatory Visit: Payer: Self-pay

## 2019-11-02 NOTE — Telephone Encounter (Signed)
Patient's daughter called to ask about his blood pressure reading of 128/26. I asked her to recheck and it was 120/46. I advised this is a good blood pressure. She says he's feeling fine with the exception of a stuffy nose and asked what can he do for that. She says he has fluticasone nasal spray. I advised to use daily and to call back on Monday if no better. She says he has an appointment on Monday at the [facility], so if it's not better, she will have that doctor to check him out.

## 2019-11-05 DIAGNOSIS — R001 Bradycardia, unspecified: Secondary | ICD-10-CM | POA: Diagnosis not present

## 2019-11-05 DIAGNOSIS — E782 Mixed hyperlipidemia: Secondary | ICD-10-CM | POA: Diagnosis not present

## 2019-11-05 DIAGNOSIS — Z950 Presence of cardiac pacemaker: Secondary | ICD-10-CM | POA: Diagnosis not present

## 2019-11-05 DIAGNOSIS — I10 Essential (primary) hypertension: Secondary | ICD-10-CM | POA: Diagnosis not present

## 2019-11-30 ENCOUNTER — Ambulatory Visit: Payer: Medicare HMO | Admitting: Family Medicine

## 2019-12-03 ENCOUNTER — Ambulatory Visit (INDEPENDENT_AMBULATORY_CARE_PROVIDER_SITE_OTHER): Payer: Medicare HMO | Admitting: Family Medicine

## 2019-12-03 ENCOUNTER — Other Ambulatory Visit: Payer: Self-pay

## 2019-12-03 ENCOUNTER — Encounter: Payer: Self-pay | Admitting: Family Medicine

## 2019-12-03 VITALS — BP 138/78 | HR 60 | Ht 70.0 in | Wt 153.0 lb

## 2019-12-03 DIAGNOSIS — J301 Allergic rhinitis due to pollen: Secondary | ICD-10-CM

## 2019-12-03 DIAGNOSIS — E785 Hyperlipidemia, unspecified: Secondary | ICD-10-CM

## 2019-12-03 DIAGNOSIS — N401 Enlarged prostate with lower urinary tract symptoms: Secondary | ICD-10-CM | POA: Diagnosis not present

## 2019-12-03 DIAGNOSIS — I5022 Chronic systolic (congestive) heart failure: Secondary | ICD-10-CM | POA: Diagnosis not present

## 2019-12-03 DIAGNOSIS — K219 Gastro-esophageal reflux disease without esophagitis: Secondary | ICD-10-CM | POA: Diagnosis not present

## 2019-12-03 MED ORDER — OMEPRAZOLE 20 MG PO CPDR: 20.0000 mg | DELAYED_RELEASE_CAPSULE | Freq: Every day | ORAL | 1 refills | Status: DC

## 2019-12-03 MED ORDER — FLUTICASONE PROPIONATE 50 MCG/ACT NA SUSP: 1.0000 | Freq: Every day | NASAL | 1 refills | Status: DC

## 2019-12-03 MED ORDER — SIMVASTATIN 40 MG PO TABS: 40.0000 mg | ORAL_TABLET | Freq: Every day | ORAL | 0 refills | Status: DC

## 2019-12-03 MED ORDER — FLUTICASONE PROPIONATE 50 MCG/ACT NA SUSP: 1.0000 | Freq: Every day | NASAL | 0 refills | Status: DC

## 2019-12-03 MED ORDER — SIMVASTATIN 40 MG PO TABS: 40.0000 mg | ORAL_TABLET | Freq: Every day | ORAL | 1 refills | Status: DC

## 2019-12-03 MED ORDER — TAMSULOSIN HCL 0.4 MG PO CAPS: 0.4000 mg | ORAL_CAPSULE | Freq: Every day | ORAL | 1 refills | Status: DC

## 2019-12-03 NOTE — Progress Notes (Addendum)
Date:  12/03/2019   Name:  Eric [name]   DOB:  [birth year]   MRN:  [id number]   Chief Complaint: Allergic Rhinitis  (Debra/ daughter in with pt- verified with pt), Gastroesophageal Reflux, and Benign Prostatic Hypertrophy  Gastroesophageal Reflux He reports no abdominal pain, no belching, no chest pain, no choking, no coughing, no dysphagia, no early satiety, no globus sensation, no heartburn, no hoarse voice, no nausea, no sore throat, no stridor, no tooth decay, no water brash or no wheezing. This is a chronic problem. The current episode started more than 1 year ago. The problem occurs occasionally. The problem has been gradually improving. The symptoms are aggravated by certain foods. Pertinent negatives include no anemia, fatigue, melena, muscle weakness or orthopnea. There are no known risk factors. He has tried a PPI for the symptoms. The treatment provided moderate relief. Past procedures do not include an abdominal ultrasound, an EGD, esophageal manometry, esophageal pH monitoring, H. pylori antibody titer or a UGI.  Benign Prostatic Hypertrophy This is a chronic problem. The current episode started more than 1 year ago. The problem has been gradually improving since onset. Irritative symptoms do not include frequency, nocturia or urgency. Obstructive symptoms do not include dribbling, incomplete emptying, an intermittent stream, a slower stream, straining or a weak stream. Pertinent negatives include no chills, dysuria, hematuria, hesitancy, nausea or vomiting. Nothing aggravates the symptoms. Past treatments include tamsulosin.  URI  This is a chronic problem. The current episode started more than 1 year ago. The problem has been waxing and waning. Pertinent negatives include no abdominal pain, chest pain, coughing, diarrhea, dysuria, ear pain, headaches, joint pain, nausea, neck pain, rash, sore throat, vomiting or wheezing. Treatments tried: nasal steroid. The treatment provided mild  relief.    Lab Results  Component Value Date   CREATININE 1.14 09/03/2019   BUN 23 09/03/2019   NA 139 09/03/2019   K 4.3 09/03/2019   CL 103 09/03/2019   CO2 24 09/03/2019   Lab Results  Component Value Date   CHOL 241 (H) 09/03/2019   HDL 71 09/03/2019   LDLCALC 149 (H) 09/03/2019   TRIG 122 09/03/2019   CHOLHDL 2.5 08/24/2017   No results found for: TSH No results found for: HGBA1C Lab Results  Component Value Date   WBC 3.8 (L) 12/20/2018   HGB 11.4 (L) 12/20/2018   HCT 35.1 (L) 12/20/2018   MCV 91.2 12/20/2018   PLT 197 12/20/2018   Lab Results  Component Value Date   ALT 13 09/03/2019   AST 18 09/03/2019   ALKPHOS 46 09/03/2019   BILITOT 0.3 09/03/2019     Review of Systems  Constitutional: Negative for chills, fatigue and fever.  HENT: Positive for postnasal drip. Negative for drooling, ear discharge, ear pain, hoarse voice and sore throat.   Respiratory: Negative for cough, choking, shortness of breath and wheezing.   Cardiovascular: Negative for chest pain, palpitations and leg swelling.  Gastrointestinal: Negative for abdominal pain, blood in stool, constipation, diarrhea, dysphagia, heartburn, melena, nausea and vomiting.  Endocrine: Negative for polydipsia.  Genitourinary: Negative for dysuria, frequency, hematuria, hesitancy, incomplete emptying, nocturia and urgency.  Musculoskeletal: Negative for back pain, joint pain, myalgias, muscle weakness and neck pain.  Skin: Negative for rash.  Allergic/Immunologic: Negative for environmental allergies.  Neurological: Negative for dizziness and headaches.  Hematological: Does not bruise/bleed easily.  Psychiatric/Behavioral: Negative for suicidal ideas. The patient is not nervous/anxious.     Patient Active Problem List  Diagnosis Date Noted  . Essential hypertension 10/22/2015  . Hyperlipidemia 10/22/2015  . Benign prostatic hyperplasia 10/22/2015    No Known Allergies  Past Surgical History:    Procedure Laterality Date  . EYE SURGERY     cataract extraction  . HERNIA REPAIR Left 2014   inguinal hernia  . PACEMAKER INSERTION Left 12/27/2017   Procedure: INSERTION PACEMAKER-INITIAL DUAL CHAMBER;  Surgeon: Isaias Cowman, MD;  Location: [facility] ORS;  Service: Cardiovascular;  Laterality: Left;    Social History   Tobacco Use  . Smoking status: Never Smoker  . Smokeless tobacco: Never Used  Vaping Use  . Vaping Use: Never used  Substance Use Topics  . Alcohol use: No  . Drug use: No     Medication list has been reviewed and updated.  Current Meds  Medication Sig  . amLODipine (NORVASC) 5 MG tablet Take 1 tablet by mouth daily. kowalski  . aspirin EC 81 MG tablet Take 81 mg by mouth daily.  . brimonidine (ALPHAGAN) 0.2 % ophthalmic solution   . fluticasone (FLONASE) 50 MCG/ACT nasal spray Place 1 spray into both nostrils daily.  Marland Kitchen latanoprost (XALATAN) 0.005 % ophthalmic solution Place 1 drop into both eyes at bedtime.  Marland Kitchen losartan (COZAAR) 100 MG tablet Take 1 tablet by mouth daily. [name]  . omeprazole (PRILOSEC) 20 MG capsule Take 1 capsule (20 mg total) by mouth daily.  . simvastatin (ZOCOR) 40 MG tablet Take 1 tablet (40 mg total) by mouth at bedtime.  . tamsulosin (FLOMAX) 0.4 MG CAPS capsule Take 1 capsule (0.4 mg total) by mouth daily.  . timolol (BETIMOL) 0.25 % ophthalmic solution Place 1 drop into both eyes 2 (two) times daily.  . [DISCONTINUED] fluticasone (FLONASE) 50 MCG/ACT nasal spray Place 1 spray into both nostrils daily.  . [DISCONTINUED] fluticasone (FLONASE) 50 MCG/ACT nasal spray Place 1 spray into both nostrils daily.  . [DISCONTINUED] omeprazole (PRILOSEC) 20 MG capsule Take 1 capsule (20 mg total) by mouth daily.  . [DISCONTINUED] simvastatin (ZOCOR) 40 MG tablet Take 1 tablet (40 mg total) by mouth at bedtime.  . [DISCONTINUED] simvastatin (ZOCOR) 40 MG tablet Take 1 tablet (40 mg total) by mouth at bedtime.  . [DISCONTINUED] tamsulosin  (FLOMAX) 0.4 MG CAPS capsule Take 1 capsule (0.4 mg total) by mouth daily.    PHQ 2/9 Scores 12/03/2019 09/03/2019 03/07/2019 09/14/2018  PHQ - 2 Score 0 0 0 0  PHQ- 9 Score 0 0 0 0    GAD 7 : Generalized Anxiety Score 12/03/2019 09/03/2019  Nervous, Anxious, on Edge 0 0  Control/stop worrying 0 0  Worry too much - different things 0 0  Trouble relaxing 0 0  Restless 0 0  Easily annoyed or irritable 0 0  Afraid - awful might happen 0 0  Total GAD 7 Score 0 0    BP Readings from Last 3 Encounters:  12/03/19 138/78  09/03/19 130/62  03/07/19 130/68    Physical Exam Vitals and nursing note reviewed.  HENT:     Head: Normocephalic.     Right Ear: External ear normal.     Left Ear: External ear normal.     Nose: Nose normal.  Eyes:     General: No scleral icterus.       Right eye: No discharge.        Left eye: No discharge.     Conjunctiva/sclera: Conjunctivae normal.     Pupils: Pupils are equal, round, and reactive to light.  Neck:  Thyroid: No thyromegaly.     Vascular: No JVD.     Trachea: No tracheal deviation.  Cardiovascular:     Rate and Rhythm: Normal rate and regular rhythm.     Heart sounds: Normal heart sounds. No murmur heard.  No friction rub. No gallop.   Pulmonary:     Effort: No respiratory distress.     Breath sounds: Normal breath sounds. No wheezing or rales.  Abdominal:     General: Bowel sounds are normal.     Palpations: Abdomen is soft. There is no mass.     Tenderness: There is no abdominal tenderness. There is no guarding or rebound.  Musculoskeletal:        General: No tenderness. Normal range of motion.     Cervical back: Normal range of motion and neck supple.  Lymphadenopathy:     Cervical: No cervical adenopathy.  Skin:    General: Skin is warm.     Findings: No rash.  Neurological:     Mental Status: He is alert and oriented to person, place, and time.     Cranial Nerves: No cranial nerve deficit.     Deep Tendon Reflexes:  Reflexes are normal and symmetric.     Wt Readings from Last 3 Encounters:  12/03/19 153 lb (69.4 kg)  09/03/19 153 lb (69.4 kg)  03/07/19 147 lb (66.7 kg)    BP 138/78   Pulse 60   Ht 5\' 10"  (1.778 m)   Wt 153 lb (69.4 kg)   BMI 21.95 kg/m   Assessment and Plan: 1. Seasonal allergic rhinitis due to pollen Chronic.  Episodic.  Symptoms seem to wax and wane pending he and allergies.  Continue Flonase 50 mcg nasal spray 1 spray both nostrils daily. - fluticasone (FLONASE) 50 MCG/ACT nasal spray; Place 1 spray into both nostrils daily.  Dispense: 11.1 mL; Refill: 0  2. Gastroesophageal reflux disease Chronic.  Controlled.  Stable.  Continue omeprazole 20 mg once a day. - omeprazole (PRILOSEC) 20 MG capsule; Take 1 capsule (20 mg total) by mouth daily.  Dispense: 90 capsule; Refill: 1  3. Hyperlipidemia, unspecified hyperlipidemia type Chronic.  Controlled.  Stable.  Reviewed recent labs within normal limits.  Will continue simvastatin 40 mg once at bedtime. - simvastatin (ZOCOR) 40 MG tablet; Take 1 tablet (40 mg total) by mouth at bedtime.  Dispense: 14 tablet; Refill: 0  4. Benign prostatic hyperplasia with lower urinary tract symptoms, symptom details unspecified Chronic.  Controlled.  Stable.  Patient is asymptomatic with no hesitancy, nocturia, or decrease in strain.  Will continue tamsulosin 0.4 mg once a day. - tamsulosin (FLOMAX) 0.4 MG CAPS capsule; Take 1 capsule (0.4 mg total) by mouth daily.  Dispense: 90 capsule; Refill: 1  5.  Chronic systolic heart failure:                         Chronic.  No acute symptoms as of today.  Stable.  Controlled.  This is followed by Dr. Jose Persia and is currently on medication for which he is not having any issues or symptoms of his heart failure of an acute nature.

## 2019-12-04 DIAGNOSIS — I5022 Chronic systolic (congestive) heart failure: Secondary | ICD-10-CM | POA: Diagnosis not present

## 2019-12-24 ENCOUNTER — Ambulatory Visit (INDEPENDENT_AMBULATORY_CARE_PROVIDER_SITE_OTHER): Payer: Medicare HMO | Admitting: Family Medicine

## 2019-12-24 ENCOUNTER — Other Ambulatory Visit: Payer: Self-pay

## 2019-12-24 ENCOUNTER — Encounter: Payer: Self-pay | Admitting: Family Medicine

## 2019-12-24 VITALS — BP 142/68 | HR 62 | Ht 70.0 in | Wt 150.0 lb

## 2019-12-24 DIAGNOSIS — J301 Allergic rhinitis due to pollen: Secondary | ICD-10-CM

## 2019-12-24 DIAGNOSIS — I10 Essential (primary) hypertension: Secondary | ICD-10-CM

## 2019-12-24 DIAGNOSIS — R42 Dizziness and giddiness: Secondary | ICD-10-CM | POA: Diagnosis not present

## 2019-12-24 NOTE — Progress Notes (Signed)
Date:  12/24/2019   Name:  Eric [name]   DOB:  21-May-1924   MRN:  [id number]   Chief Complaint: Hypertension (Pt has a cardiologist- Dr [name] who prescribed his BP meds. EMS was called to patients home on 12/21/2019 for high blood pressure. They have not called his cardiologist about this yet. ) and Sinusitis (Head / sinus pressure. Nose congestion. No colored production. )  Hypertension This is a chronic problem. The current episode started more than 1 year ago. The problem has been waxing and waning since onset. The problem is controlled. Pertinent negatives include no anxiety, blurred vision, chest pain, headaches, malaise/fatigue, neck pain, orthopnea, palpitations, peripheral edema, PND, shortness of breath or sweats. There are no associated agents to hypertension. Risk factors for coronary artery disease include dyslipidemia. Past treatments include angiotensin blockers and calcium channel blockers. The current treatment provides moderate improvement. There are no compliance problems.  There is no history of angina, kidney disease, CAD/MI, CVA, heart failure, left ventricular hypertrophy, PVD or retinopathy. There is no history of chronic renal disease, a hypertension causing med or renovascular disease.  Sinusitis Pertinent negatives include no chills, coughing, ear pain, headaches, neck pain, shortness of breath or sore throat.    Lab Results  Component Value Date   CREATININE 1.14 09/03/2019   BUN 23 09/03/2019   NA 139 09/03/2019   K 4.3 09/03/2019   CL 103 09/03/2019   CO2 24 09/03/2019   Lab Results  Component Value Date   CHOL 241 (H) 09/03/2019   HDL 71 09/03/2019   LDLCALC 149 (H) 09/03/2019   TRIG 122 09/03/2019   CHOLHDL 2.5 08/24/2017   No results found for: TSH No results found for: HGBA1C Lab Results  Component Value Date   WBC 3.8 (L) 12/20/2018   HGB 11.4 (L) 12/20/2018   HCT 35.1 (L) 12/20/2018   MCV 91.2 12/20/2018   PLT 197 12/20/2018   Lab  Results  Component Value Date   ALT 13 09/03/2019   AST 18 09/03/2019   ALKPHOS 46 09/03/2019   BILITOT 0.3 09/03/2019     Review of Systems  Constitutional: Negative for chills, fever and malaise/fatigue.  HENT: Negative for drooling, ear discharge, ear pain and sore throat.   Eyes: Negative for blurred vision.  Respiratory: Negative for cough, shortness of breath and wheezing.   Cardiovascular: Negative for chest pain, palpitations, orthopnea, leg swelling and PND.  Gastrointestinal: Negative for abdominal pain, blood in stool, constipation, diarrhea and nausea.  Endocrine: Negative for polydipsia.  Genitourinary: Negative for dysuria, frequency, hematuria and urgency.  Musculoskeletal: Negative for back pain, myalgias and neck pain.  Skin: Negative for rash.  Allergic/Immunologic: Negative for environmental allergies.  Neurological: Negative for dizziness and headaches.  Hematological: Does not bruise/bleed easily.  Psychiatric/Behavioral: Negative for suicidal ideas. The patient is not nervous/anxious.     Patient Active Problem List   Diagnosis Date Noted  . Essential hypertension 10/22/2015  . Hyperlipidemia 10/22/2015  . Benign prostatic hyperplasia 10/22/2015    No Known Allergies  Past Surgical History:  Procedure Laterality Date  . EYE SURGERY     cataract extraction  . HERNIA REPAIR Left 2014   inguinal hernia  . PACEMAKER INSERTION Left 12/27/2017   Procedure: INSERTION PACEMAKER-INITIAL DUAL CHAMBER;  Surgeon: Isaias Cowman, MD;  Location: [facility] ORS;  Service: Cardiovascular;  Laterality: Left;    Social History   Tobacco Use  . Smoking status: Never Smoker  . Smokeless tobacco: Never  Used  Vaping Use  . Vaping Use: Never used  Substance Use Topics  . Alcohol use: No  . Drug use: No     Medication list has been reviewed and updated.  Current Meds  Medication Sig  . acetaminophen (TYLENOL) 325 MG tablet Take 650 mg by mouth every 6 (six)  hours as needed.  Marland Kitchen amLODipine (NORVASC) 5 MG tablet Take 1 tablet by mouth daily. kowalski  . aspirin EC 81 MG tablet Take 81 mg by mouth daily.  . brimonidine (ALPHAGAN) 0.2 % ophthalmic solution   . fluticasone (FLONASE) 50 MCG/ACT nasal spray Place 1 spray into both nostrils daily.  Marland Kitchen latanoprost (XALATAN) 0.005 % ophthalmic solution Place 1 drop into both eyes at bedtime.  Marland Kitchen losartan (COZAAR) 100 MG tablet Take 1 tablet by mouth daily. [name]  . omeprazole (PRILOSEC) 20 MG capsule Take 1 capsule (20 mg total) by mouth daily.  . simvastatin (ZOCOR) 40 MG tablet Take 1 tablet (40 mg total) by mouth at bedtime.  . tamsulosin (FLOMAX) 0.4 MG CAPS capsule Take 1 capsule (0.4 mg total) by mouth daily.  . timolol (BETIMOL) 0.25 % ophthalmic solution Place 1 drop into both eyes 2 (two) times daily.    PHQ 2/9 Scores 12/03/2019 09/03/2019 03/07/2019 09/14/2018  PHQ - 2 Score 0 0 0 0  PHQ- 9 Score 0 0 0 0    GAD 7 : Generalized Anxiety Score 12/03/2019 09/03/2019  Nervous, Anxious, on Edge 0 0  Control/stop worrying 0 0  Worry too much - different things 0 0  Trouble relaxing 0 0  Restless 0 0  Easily annoyed or irritable 0 0  Afraid - awful might happen 0 0  Total GAD 7 Score 0 0    BP Readings from Last 3 Encounters:  12/24/19 (!) 142/68  12/03/19 138/78  09/03/19 130/62    Physical Exam Vitals and nursing note reviewed.  HENT:     Head: Normocephalic.     Right Ear: Tympanic membrane, ear canal and external ear normal.     Left Ear: Tympanic membrane, ear canal and external ear normal.     Nose: Nose normal. No congestion or rhinorrhea.  Eyes:     General: No scleral icterus.       Right eye: No discharge.        Left eye: No discharge.     Conjunctiva/sclera: Conjunctivae normal.     Pupils: Pupils are equal, round, and reactive to light.  Neck:     Thyroid: No thyromegaly.     Vascular: No JVD.     Trachea: No tracheal deviation.  Cardiovascular:     Rate and Rhythm:  Normal rate and regular rhythm.     Heart sounds: Normal heart sounds. No murmur heard.  No friction rub. No gallop.   Pulmonary:     Effort: No respiratory distress.     Breath sounds: Normal breath sounds. No wheezing, rhonchi or rales.  Abdominal:     General: Bowel sounds are normal.     Palpations: Abdomen is soft. There is no mass.     Tenderness: There is no abdominal tenderness. There is no guarding or rebound.     Hernia: No hernia is present.  Musculoskeletal:        General: No tenderness. Normal range of motion.     Cervical back: Normal range of motion and neck supple.  Lymphadenopathy:     Cervical: No cervical adenopathy.  Skin:  General: Skin is warm.     Findings: No rash.  Neurological:     Mental Status: He is alert and oriented to person, place, and time.     Cranial Nerves: No cranial nerve deficit.     Deep Tendon Reflexes: Reflexes are normal and symmetric.     Wt Readings from Last 3 Encounters:  12/24/19 150 lb (68 kg)  12/03/19 153 lb (69.4 kg)  09/03/19 153 lb (69.4 kg)    BP (!) 142/68 (BP Location: Left Arm, Patient Position: Sitting, Cuff Size: Normal)   Pulse 62   Ht 5\' 10"  (1.778 m)   Wt 150 lb (68 kg)   SpO2 98%   BMI 21.52 kg/m   Assessment and Plan: 1. Essential hypertension Chronic. Episodic. Relatively stable. Over the weekend patient had an episode of dizziness for which a family member took his blood pressure and it was noted to be elevated. Rescue was called and patient was evaluated at home and did have an initial high blood pressure reading which came down into a reasonable range after some time with rest. Diastolic was noted to be 65 with repeat blood pressure with systolic in the 628 range. It was explained to the patient that blood pressure is dynamic and can be up and down during the course of the day with activity. Patient's pulse rate was noted to be 60 which is probably secondary to his beta-blocker eyedrops. We did not make  any adjustments to his blood pressure but we did refer to cardiology as to family was concerned about his blood pressure being elevated at times. - Ambulatory referral to Cardiology  2. Seasonal allergic rhinitis due to pollen Chronic. Seasonal. Episodic. Patient does have episodes of dizziness but no vertigo. This is consistent with nasal congestion. Patient does mow the yard without wearing a mask and he has been instructed to do so. It has been suggested that patient may use some Coricidin but we do not want to get into blood pressure medications of a 24-hour duration due to his prostate enlargement.  3. Dizziness Episodic. Mild. There is no vertigo aspect to this dizziness but it seems to be more of a lightheadedness. This is been explained to the patient that this actually may be due to to a decrease in blood pressure is much is an increase in pressure. Patient's been encouraged to take fluids particular when he has been working outdoors and to wear a mask when he is mowing to lessen the pollen exposure. - Ambulatory referral to Cardiology

## 2019-12-31 DIAGNOSIS — Z950 Presence of cardiac pacemaker: Secondary | ICD-10-CM | POA: Diagnosis not present

## 2019-12-31 DIAGNOSIS — R42 Dizziness and giddiness: Secondary | ICD-10-CM | POA: Diagnosis not present

## 2019-12-31 DIAGNOSIS — R001 Bradycardia, unspecified: Secondary | ICD-10-CM | POA: Diagnosis not present

## 2019-12-31 DIAGNOSIS — I10 Essential (primary) hypertension: Secondary | ICD-10-CM | POA: Diagnosis not present

## 2019-12-31 DIAGNOSIS — E782 Mixed hyperlipidemia: Secondary | ICD-10-CM | POA: Diagnosis not present

## 2020-01-01 ENCOUNTER — Telehealth: Payer: Self-pay | Admitting: Family Medicine

## 2020-01-01 NOTE — Telephone Encounter (Signed)
Spoke to [employer] concerning pt taking allergy med to prevent sinus infections

## 2020-01-01 NOTE — Telephone Encounter (Signed)
Lady called to ask if Pt needs to continue taking allergy medication / she states it is $11-12 / please advise   Threasa Beards did not give a name and hung up when I asked for a contact number for nurse or provider to call her back

## 2020-02-01 ENCOUNTER — Telehealth: Payer: Self-pay | Admitting: Family Medicine

## 2020-02-01 NOTE — Telephone Encounter (Signed)
Left message for patient to call back and schedule Medicare Annual Wellness Visit (AWV) either virtually/audio only or in office. Whichever the patients preference is.  No history of AWV; please schedule at anytime with [facility] Health Advisor.  This should be a 40 minute visit  AWV-I PER [place] AS OF 05/24/2009

## 2020-02-04 ENCOUNTER — Ambulatory Visit: Payer: Medicare HMO

## 2020-02-04 ENCOUNTER — Ambulatory Visit (INDEPENDENT_AMBULATORY_CARE_PROVIDER_SITE_OTHER): Payer: Medicare HMO

## 2020-02-04 DIAGNOSIS — Z0000 Encounter for general adult medical examination without abnormal findings: Secondary | ICD-10-CM | POA: Diagnosis not present

## 2020-02-04 NOTE — Progress Notes (Signed)
Subjective:   Eric [name] is a 86 y.o. male who presents for an Initial Medicare Annual Wellness Visit.  Virtual Visit via Telephone Note  I connected with  Eric [name] on 02/04/20 at  8:40 AM EDT by telephone and verified that I am speaking with the correct person using two identifiers.  Medicare Annual Wellness visit completed telephonically due to Covid-19 pandemic.   Location: Patient: home Provider: [facility]   I discussed the limitations, risks, security and privacy concerns of performing an evaluation and management service by telephone and the availability of in person appointments. The patient expressed understanding and agreed to proceed.  Unable to perform video visit due to video visit attempted and failed and/or patient does not have video capability.   Some vital signs may be absent or patient reported.   Clemetine Marker, LPN    Review of Systems     Cardiac Risk Factors include: advanced age (>32men, >86 women);male gender;hypertension;dyslipidemia     Objective:    There were no vitals filed for this visit. There is no height or weight on file to calculate BMI.  Advanced Directives 02/04/2020 12/20/2018 12/27/2017 12/20/2017 06/18/2016 09/16/2015  Does Patient Have a Medical Advance Directive? Yes Yes Yes Yes No No  Type of Paramedic of [place];Living will Living will Healthcare Power of [place];Living will - -  Does patient want to make changes to medical advance directive? - - No - Patient declined No - Patient declined - -  Copy of [place] in Chart? Yes - validated most recent copy scanned in chart (See row information) - Yes No - copy requested - -  Would patient like information on creating a medical advance directive? - - - - - No - patient declined information    Current Medications (verified) Outpatient Encounter Medications as of 02/04/2020  Medication Sig  . acetaminophen  (TYLENOL) 325 MG tablet Take 650 mg by mouth every 6 (six) hours as needed.  Marland Kitchen amLODipine (NORVASC) 5 MG tablet Take 1 tablet by mouth daily. kowalski  . brimonidine (ALPHAGAN) 0.2 % ophthalmic solution   . fluticasone (FLONASE) 50 MCG/ACT nasal spray Place 1 spray into both nostrils daily.  Marland Kitchen latanoprost (XALATAN) 0.005 % ophthalmic solution Place 1 drop into both eyes at bedtime.  Marland Kitchen losartan (COZAAR) 100 MG tablet Take 1 tablet by mouth daily. [name]  . omeprazole (PRILOSEC) 20 MG capsule Take 1 capsule (20 mg total) by mouth daily.  . simvastatin (ZOCOR) 40 MG tablet Take 1 tablet (40 mg total) by mouth at bedtime.  . tamsulosin (FLOMAX) 0.4 MG CAPS capsule Take 1 capsule (0.4 mg total) by mouth daily.  . timolol (BETIMOL) 0.25 % ophthalmic solution Place 1 drop into both eyes 2 (two) times daily.  . [DISCONTINUED] aspirin EC 81 MG tablet Take 81 mg by mouth daily.   No facility-administered encounter medications on file as of 02/04/2020.    Allergies (verified) Patient has no known allergies.   History: Past Medical History:  Diagnosis Date  . Arthritis    knees  . Dysrhythmia    bradycardia  . GERD (gastroesophageal reflux disease)   . Hard of hearing    has a hearing aid but has not worn  . High cholesterol   . History of kidney stones   . Hypertension   . Prostate disorder    Past Surgical History:  Procedure Laterality Date  . EYE SURGERY  cataract extraction  . HERNIA REPAIR Left 2014   inguinal hernia  . PACEMAKER INSERTION Left 12/27/2017   Procedure: INSERTION PACEMAKER-INITIAL DUAL CHAMBER;  Surgeon: Isaias Cowman, MD;  Location: [facility] ORS;  Service: Cardiovascular;  Laterality: Left;   History reviewed. No pertinent family history. Social History   Socioeconomic History  . Marital status: Married    Spouse name: Not on file  . Number of children: Not on file  . Years of education: Not on file  . Highest education level: Not on file    Occupational History  . Not on file  Tobacco Use  . Smoking status: Never Smoker  . Smokeless tobacco: Never Used  Vaping Use  . Vaping Use: Never used  Substance and Sexual Activity  . Alcohol use: No  . Drug use: No  . Sexual activity: Not Currently  Other Topics Concern  . Not on file  Social History Narrative  . Not on file   Social Determinants of Health   Financial Resource Strain: Low Risk   . Difficulty of Paying Living Expenses: Not hard at all  Food Insecurity: No Food Insecurity  . Worried About Charity fundraiser in the Last Year: Never true  . Ran Out of Food in the Last Year: Never true  Transportation Needs: No Transportation Needs  . Lack of Transportation (Medical): No  . Lack of Transportation (Non-Medical): No  Physical Activity: Inactive  . Days of Exercise per Week: 0 days  . Minutes of Exercise per Session: 0 min  Stress: No Stress Concern Present  . Feeling of Stress : Not at all  Social Connections: Unknown  . Frequency of Communication with Friends and Family: Patient refused  . Frequency of Social Gatherings with Friends and Family: Patient refused  . Attends Religious Services: Patient refused  . Active Member of Clubs or Organizations: Patient refused  . Attends Archivist Meetings: Patient refused  . Marital Status: Married    Tobacco Counseling Counseling given: Not Answered   Clinical Intake:  Pre-visit preparation completed: Yes  Pain : No/denies pain     Nutritional Risks: None Diabetes: No  How often do you need to have someone help you when you read instructions, pamphlets, or other written materials from your doctor or pharmacy?: 1 - Never   Interpreter Needed?: No  Information entered by :: Clemetine Marker LPN   Activities of Daily Living In your present state of health, do you have any difficulty performing the following activities: 02/04/2020 12/24/2019  Hearing? Y N  Comment declines hearing aids -   Vision? N N  Difficulty concentrating or making decisions? N N  Walking or climbing stairs? N N  Dressing or bathing? N N  Doing errands, shopping? N N  Preparing Food and eating ? N -  Using the Toilet? N -  In the past six months, have you accidently leaked urine? N -  Do you have problems with loss of bowel control? N -  Managing your Medications? N -  Managing your Finances? N -  Housekeeping or managing your Housekeeping? N -  Some recent data might be hidden    Patient Care Team: Juline Patch, MD as PCP - General (Family Medicine)  Indicate any recent Medical Services you may have received from other than [facility] providers in the past year (date may be approximate).     Assessment:   This is a routine wellness examination for [facility].  Hearing/Vision screen  Hearing  Screening   125Hz  250Hz  500Hz  1000Hz  2000Hz  3000Hz  4000Hz  6000Hz  8000Hz   Right ear:           Left ear:           Comments: Pt has hearing difficulty, declines hearing aids   Vision Screening Comments: Annual vision screenings done at [place] issues and exercise activities discussed: Current Exercise Habits: The patient does not participate in regular exercise at present, Exercise limited by: None identified  Goals   None    Depression Screen PHQ 2/9 Scores 02/04/2020 12/03/2019 09/03/2019 03/07/2019 09/14/2018 09/14/2018 03/21/2017  PHQ - 2 Score 0 0 0 0 0 0 0  PHQ- 9 Score - 0 0 0 0 0 0    Fall Risk Fall Risk  02/04/2020 12/03/2019 09/03/2019 09/14/2018 03/21/2017  Falls in the past year? 0 0 0 0 No  Number falls in past yr: 0 - - - -  Injury with Fall? 0 - - - -  Risk for fall due to : No Fall Risks - - - -  Follow up Falls prevention discussed Falls evaluation completed Falls evaluation completed Falls evaluation completed -    Any stairs in or around the home? Yes  If so, are there any without handrails? No  Home free of loose throw rugs in walkways, pet beds, electrical cords,  etc? Yes  Adequate lighting in your home to reduce risk of falls? Yes   ASSISTIVE DEVICES UTILIZED TO PREVENT FALLS:  Life alert? No  Use of a cane, walker or w/c? No  Grab bars in the bathroom? No  Shower chair or bench in shower? No  Elevated toilet seat or a handicapped toilet? No   TIMED UP AND GO:  Was the test performed? No . Telephonic visit.   Cognitive Function:  6CIT deferred due to pt having difficulty hearing during telephonic visit. Reports no memory issues.         Immunizations Immunization History  Administered Date(s) Administered  . Fluad Quad(high Dose 65+) 02/07/2019  . Influenza, High Dose Seasonal PF 03/21/2017, 06/20/2018  . PFIZER SARS-COV-2 Vaccination 07/14/2019, 08/07/2019  . Pneumococcal Conjugate-13 03/21/2017    TDAP status: Due, Education has been provided regarding the importance of this vaccine. Advised may receive this vaccine at local pharmacy or Health Dept. Aware to provide a copy of the vaccination record if obtained from local pharmacy or Health Dept. Verbalized acceptance and understanding.   Flu Vaccine status: Declined, Education has been provided regarding the importance of this vaccine but patient still declined. Advised may receive this vaccine at local pharmacy or Health Dept. Aware to provide a copy of the vaccination record if obtained from local pharmacy or Health Dept. Verbalized acceptance and understanding.   Pneumococcal vaccine status: Declined,  Education has been provided regarding the importance of this vaccine but patient still declined. Advised may receive this vaccine at local pharmacy or Health Dept. Aware to provide a copy of the vaccination record if obtained from local pharmacy or Health Dept. Verbalized acceptance and understanding.    Covid-19 vaccine status: Completed vaccines. Pt advised to bring vaccination record to next appt.   Qualifies for Shingles Vaccine? Yes   Zostavax completed No   Shingrix  Completed?: No.    Education has been provided regarding the importance of this vaccine. Patient has been advised to call insurance company to determine out of pocket expense if they have not yet received this vaccine. Advised may also receive vaccine at  local pharmacy or Health Dept. Verbalized acceptance and understanding.  Screening Tests Health Maintenance  Topic Date Due  . INFLUENZA VACCINE  12/23/2019  . TETANUS/TDAP  03/06/2020 (Originally 10/04/1942)  . PNA vac Low Risk Adult (2 of 2 - PPSV23) 03/06/2020 (Originally 03/21/2018)  . COVID-19 Vaccine  Completed    Health Maintenance  Health Maintenance Due  Topic Date Due  . INFLUENZA VACCINE  12/23/2019    Colorectal cancer screening: No longer required.   Lung Cancer Screening: (Low Dose CT Chest recommended if Age 19-80 years, 30 pack-year currently smoking OR have quit w/in 15years.) does not qualify.    Additional Screening:  Hepatitis C Screening: no longer required  Vision Screening: Recommended annual ophthalmology exams for early detection of glaucoma and other disorders of the eye. Is the patient up to date with their annual eye exam?  Yes  Who is the provider or what is the name of the office in which the patient attends annual eye exams? Dr. Gloriann Loan  Dental Screening: Recommended annual dental exams for proper oral hygiene  Community Resource Referral / Chronic Care Management: CRR required this visit?  No   CCM required this visit?  No      Plan:     I have personally reviewed and noted the following in the patient's chart:   . Medical and social history . Use of alcohol, tobacco or illicit drugs  . Current medications and supplements . Functional ability and status . Nutritional status . Physical activity . Advanced directives . List of other physicians . Hospitalizations, surgeries, and ER visits in previous 12 months . Vitals . Screenings to include cognitive, depression, and falls . Referrals  and appointments  In addition, I have reviewed and discussed with patient certain preventive protocols, quality metrics, and best practice recommendations. A written personalized care plan for preventive services as well as general preventive health recommendations were provided to patient.     Clemetine Marker, LPN   [id number]/[id number]   Nurse Notes: none

## 2020-02-04 NOTE — Patient Instructions (Signed)
Mr. Eric [name] , Thank you for taking time to come for your Medicare Wellness Visit. I appreciate your ongoing commitment to your health goals. Please review the following plan we discussed and let me know if I can assist you in the future.   Screening recommendations/referrals: Colonoscopy: no longer required Recommended yearly ophthalmology/optometry visit for glaucoma screening and checkup Recommended yearly dental visit for hygiene and checkup  Vaccinations: Influenza vaccine: declined Pneumococcal vaccine: declined Tdap vaccine: DUE Shingles vaccine: Shingrix discussed. Please contact your pharmacy for coverage information.  Covid-19: please bring your vaccination record with you to your next appt  Conditions/risks identified: Keep up the great work!  Next appointment: Follow up in one year for your annual wellness visit.   Preventive Care 84 Years and Older, Male Preventive care refers to lifestyle choices and visits with your health care provider that can promote health and wellness. What does preventive care include?  A yearly physical exam. This is also called an annual well check.  Dental exams once or twice a year.  Routine eye exams. Ask your health care provider how often you should have your eyes checked.  Personal lifestyle choices, including:  Daily care of your teeth and gums.  Regular physical activity.  Eating a healthy diet.  Avoiding tobacco and drug use.  Limiting alcohol use.  Practicing safe sex.  Taking low doses of aspirin every day.  Taking vitamin and mineral supplements as recommended by your health care provider. What happens during an annual well check? The services and screenings done by your health care provider during your annual well check will depend on your age, overall health, lifestyle risk factors, and family history of disease. Counseling  Your health care provider may ask you questions about your:  Alcohol use.  Tobacco  use.  Drug use.  Emotional well-being.  Home and relationship well-being.  Sexual activity.  Eating habits.  History of falls.  Memory and ability to understand (cognition).  Work and work Statistician. Screening  You may have the following tests or measurements:  Height, weight, and BMI.  Blood pressure.  Lipid and cholesterol levels. These may be checked every 5 years, or more frequently if you are over 84 years old.  Skin check.  Lung cancer screening. You may have this screening every year starting at age 65 if you have a 30-pack-year history of smoking and currently smoke or have quit within the past 15 years.  Fecal occult blood test (FOBT) of the stool. You may have this test every year starting at age 60.  Flexible sigmoidoscopy or colonoscopy. You may have a sigmoidoscopy every 5 years or a colonoscopy every 10 years starting at age 84.  Prostate cancer screening. Recommendations will vary depending on your family history and other risks.  Hepatitis C blood test.  Hepatitis B blood test.  Sexually transmitted disease (STD) testing.  Diabetes screening. This is done by checking your blood sugar (glucose) after you have not eaten for a while (fasting). You may have this done every 1-3 years.  Abdominal aortic aneurysm (AAA) screening. You may need this if you are a current or former smoker.  Osteoporosis. You may be screened starting at age 84 if you are at high risk. Talk with your health care provider about your test results, treatment options, and if necessary, the need for more tests. Vaccines  Your health care provider may recommend certain vaccines, such as:  Influenza vaccine. This is recommended every year.  Tetanus, diphtheria, and acellular  pertussis (Tdap, Td) vaccine. You may need a Td booster every 10 years.  Zoster vaccine. You may need this after age 60.  Pneumococcal 13-valent conjugate (PCV13) vaccine. One dose is recommended after age  84.  Pneumococcal polysaccharide (PPSV23) vaccine. One dose is recommended after age 84. Talk to your health care provider about which screenings and vaccines you need and how often you need them. This information is not intended to replace advice given to you by your health care provider. Make sure you discuss any questions you have with your health care provider. Document Released: 06/06/2015 Document Revised: 01/28/2016 Document Reviewed: 03/11/2015 Elsevier Interactive Patient Education  2017 [place] Prevention in the Home Falls can cause injuries. They can happen to people of all ages. There are many things you can do to make your home safe and to help prevent falls. What can I do on the outside of my home?  Regularly fix the edges of walkways and driveways and fix any cracks.  Remove anything that might make you trip as you walk through a door, such as a raised step or threshold.  Trim any bushes or trees on the path to your home.  Use bright outdoor lighting.  Clear any walking paths of anything that might make someone trip, such as rocks or tools.  Regularly check to see if handrails are loose or broken. Make sure that both sides of any steps have handrails.  Any raised decks and porches should have guardrails on the edges.  Have any leaves, snow, or ice cleared regularly.  Use sand or salt on walking paths during winter.  Clean up any spills in your garage right away. This includes oil or grease spills. What can I do in the bathroom?  Use night lights.  Install grab bars by the toilet and in the tub and shower. Do not use towel bars as grab bars.  Use non-skid mats or decals in the tub or shower.  If you need to sit down in the shower, use a plastic, non-slip stool.  Keep the floor dry. Clean up any water that spills on the floor as soon as it happens.  Remove soap buildup in the tub or shower regularly.  Attach bath mats securely with double-sided  non-slip rug tape.  Do not have throw rugs and other things on the floor that can make you trip. What can I do in the bedroom?  Use night lights.  Make sure that you have a light by your bed that is easy to reach.  Do not use any sheets or blankets that are too big for your bed. They should not hang down onto the floor.  Have a firm chair that has side arms. You can use this for support while you get dressed.  Do not have throw rugs and other things on the floor that can make you trip. What can I do in the kitchen?  Clean up any spills right away.  Avoid walking on wet floors.  Keep items that you use a lot in easy-to-reach places.  If you need to reach something above you, use a strong step stool that has a grab bar.  Keep electrical cords out of the way.  Do not use floor polish or wax that makes floors slippery. If you must use wax, use non-skid floor wax.  Do not have throw rugs and other things on the floor that can make you trip. What can I do with my stairs?  Do not leave any items on the stairs.  Make sure that there are handrails on both sides of the stairs and use them. Fix handrails that are broken or loose. Make sure that handrails are as long as the stairways.  Check any carpeting to make sure that it is firmly attached to the stairs. Fix any carpet that is loose or worn.  Avoid having throw rugs at the top or bottom of the stairs. If you do have throw rugs, attach them to the floor with carpet tape.  Make sure that you have a light switch at the top of the stairs and the bottom of the stairs. If you do not have them, ask someone to add them for you. What else can I do to help prevent falls?  Wear shoes that:  Do not have high heels.  Have rubber bottoms.  Are comfortable and fit you well.  Are closed at the toe. Do not wear sandals.  If you use a stepladder:  Make sure that it is fully opened. Do not climb a closed stepladder.  Make sure that both  sides of the stepladder are locked into place.  Ask someone to hold it for you, if possible.  Clearly mark and make sure that you can see:  Any grab bars or handrails.  First and last steps.  Where the edge of each step is.  Use tools that help you move around (mobility aids) if they are needed. These include:  Canes.  Walkers.  Scooters.  Crutches.  Turn on the lights when you go into a dark area. Replace any light bulbs as soon as they burn out.  Set up your furniture so you have a clear path. Avoid moving your furniture around.  If any of your floors are uneven, fix them.  If there are any pets around you, be aware of where they are.  Review your medicines with your doctor. Some medicines can make you feel dizzy. This can increase your chance of falling. Ask your doctor what other things that you can do to help prevent falls. This information is not intended to replace advice given to you by your health care provider. Make sure you discuss any questions you have with your health care provider. Document Released: 03/06/2009 Document Revised: 10/16/2015 Document Reviewed: 06/14/2014 Elsevier Interactive Patient Education  2017 [employer].

## 2020-03-10 ENCOUNTER — Ambulatory Visit: Payer: Medicare HMO | Admitting: Family Medicine

## 2020-04-09 ENCOUNTER — Other Ambulatory Visit: Payer: Self-pay | Admitting: Family Medicine

## 2020-04-09 DIAGNOSIS — J301 Allergic rhinitis due to pollen: Secondary | ICD-10-CM

## 2020-04-14 DIAGNOSIS — I5022 Chronic systolic (congestive) heart failure: Secondary | ICD-10-CM | POA: Diagnosis not present

## 2020-05-14 ENCOUNTER — Other Ambulatory Visit: Payer: Self-pay | Admitting: Family Medicine

## 2020-05-14 DIAGNOSIS — K219 Gastro-esophageal reflux disease without esophagitis: Secondary | ICD-10-CM

## 2020-05-14 DIAGNOSIS — E785 Hyperlipidemia, unspecified: Secondary | ICD-10-CM

## 2020-05-14 DIAGNOSIS — N401 Enlarged prostate with lower urinary tract symptoms: Secondary | ICD-10-CM

## 2020-06-04 ENCOUNTER — Ambulatory Visit: Payer: Medicare HMO | Admitting: Family Medicine

## 2020-06-11 DIAGNOSIS — R001 Bradycardia, unspecified: Secondary | ICD-10-CM | POA: Diagnosis not present

## 2020-06-11 DIAGNOSIS — I5022 Chronic systolic (congestive) heart failure: Secondary | ICD-10-CM | POA: Diagnosis not present

## 2020-06-11 DIAGNOSIS — I10 Essential (primary) hypertension: Secondary | ICD-10-CM | POA: Diagnosis not present

## 2020-06-11 DIAGNOSIS — E782 Mixed hyperlipidemia: Secondary | ICD-10-CM | POA: Diagnosis not present

## 2020-06-11 DIAGNOSIS — Z950 Presence of cardiac pacemaker: Secondary | ICD-10-CM | POA: Diagnosis not present

## 2020-08-05 ENCOUNTER — Other Ambulatory Visit: Payer: Self-pay | Admitting: Family Medicine

## 2020-08-05 DIAGNOSIS — N401 Enlarged prostate with lower urinary tract symptoms: Secondary | ICD-10-CM

## 2020-08-05 DIAGNOSIS — E785 Hyperlipidemia, unspecified: Secondary | ICD-10-CM

## 2020-08-05 DIAGNOSIS — K219 Gastro-esophageal reflux disease without esophagitis: Secondary | ICD-10-CM

## 2020-08-07 ENCOUNTER — Telehealth: Payer: Self-pay | Admitting: Family Medicine

## 2020-08-07 NOTE — Telephone Encounter (Signed)
Requested medications are due for refill today yes  Requested medications are on the active medication list yes  Last visit 12/2019  Future visit scheduled no  Notes to clinic Last ordered by a historical provider.

## 2020-08-07 NOTE — Telephone Encounter (Signed)
Copied from [place] [id number]. Topic: Quick Communication - Rx Refill/Question >> Aug 07, 2020  4:46 PM Mcneil, Ja-Kwan wrote: Medication: brimonidine (ALPHAGAN) 0.2 % ophthalmic solution  Has the patient contacted their pharmacy? yes - Pharmacy called to request refill  Preferred Pharmacy (with phone number or street name): [place], [place] Phone: [phone]  Fax: [phone]  Agent: Please be advised that RX refills may take up to 3 business days. We ask that you follow-up with your pharmacy.

## 2020-08-08 NOTE — Telephone Encounter (Signed)
Left a message

## 2020-08-08 NOTE — Telephone Encounter (Signed)
Please call daughter and tell her that we have never prescribed the eye solutions for the pt- he has to get those from his eye doctor

## 2020-08-08 NOTE — Telephone Encounter (Signed)
[place], [place] Phone:  [phone]  Fax:  [phone]     Calling checking on the status of message below.

## 2020-09-09 DIAGNOSIS — I5022 Chronic systolic (congestive) heart failure: Secondary | ICD-10-CM | POA: Diagnosis not present

## 2020-09-16 DIAGNOSIS — H40153 Residual stage of open-angle glaucoma, bilateral: Secondary | ICD-10-CM | POA: Diagnosis not present

## 2020-09-16 DIAGNOSIS — H524 Presbyopia: Secondary | ICD-10-CM | POA: Diagnosis not present

## 2020-10-01 ENCOUNTER — Other Ambulatory Visit: Payer: Self-pay | Admitting: Family Medicine

## 2020-10-01 DIAGNOSIS — N401 Enlarged prostate with lower urinary tract symptoms: Secondary | ICD-10-CM

## 2020-10-01 DIAGNOSIS — K219 Gastro-esophageal reflux disease without esophagitis: Secondary | ICD-10-CM

## 2020-10-01 DIAGNOSIS — E785 Hyperlipidemia, unspecified: Secondary | ICD-10-CM

## 2020-10-01 NOTE — Telephone Encounter (Signed)
Requested medication (s) are due for refill today:yes  Requested medication (s) are on the active medication list:yes  Last refill:  07/31/2020  Future visit scheduled:no  Notes to clinic: overdue for labs Patient no showed appt on 05/2020 Review for refills   Requested Prescriptions  Pending Prescriptions Disp Refills   simvastatin (ZOCOR) 40 MG tablet [Pharmacy Med Name: SIMVASTATIN 40 MG Tablet] 90 tablet 1    Sig: TAKE 1 TABLET (40 MG TOTAL) BY MOUTH AT BEDTIME.      Cardiovascular:  Antilipid - Statins Failed - 10/01/2020  2:16 PM      Failed - Total Cholesterol in normal range and within 360 days    Cholesterol, Total  Date Value Ref Range Status  09/03/2019 241 (H) 100 - 199 mg/dL Final          Failed - LDL in normal range and within 360 days    LDL Chol Calc (NIH)  Date Value Ref Range Status  09/03/2019 149 (H) 0 - 99 mg/dL Final          Failed - HDL in normal range and within 360 days    HDL  Date Value Ref Range Status  09/03/2019 71 >39 mg/dL Final          Failed - Triglycerides in normal range and within 360 days    Triglycerides  Date Value Ref Range Status  09/03/2019 122 0 - 149 mg/dL Final          Passed - Patient is not pregnant      Passed - Valid encounter within last 12 months    Recent Outpatient Visits           9 months ago Essential hypertension   [place], [place]   10 months ago Hyperlipidemia, unspecified hyperlipidemia type   [facility] Juline Patch, MD   1 year ago Essential hypertension   [facility] Juline Patch, MD   1 year ago Essential hypertension   [facility] Juline Patch, MD   1 year ago Dehydration   [facility] Juline Patch, MD                  omeprazole (PRILOSEC) 20 MG capsule [Pharmacy Med Name: OMEPRAZOLE 20 MG Capsule Delayed Release] 90 capsule 1    Sig: TAKE 1 CAPSULE EVERY DAY      Gastroenterology: Proton Pump  Inhibitors Passed - 10/01/2020  2:16 PM      Passed - Valid encounter within last 12 months    Recent Outpatient Visits           9 months ago Essential hypertension   [place], Deanna C, MD   10 months ago Hyperlipidemia, unspecified hyperlipidemia type   [facility] Juline Patch, MD   1 year ago Essential hypertension   [facility] Juline Patch, MD   1 year ago Essential hypertension   [facility] Juline Patch, MD   1 year ago Dehydration   [facility] Juline Patch, MD                  tamsulosin (FLOMAX) 0.4 MG CAPS capsule [Pharmacy Med Name: TAMSULOSIN HYDROCHLORIDE 0.4 MG Capsule] 90 capsule 1    Sig: TAKE 1 CAPSULE EVERY DAY      Urology: Alpha-Adrenergic Blocker Failed - 10/01/2020  2:16 PM  Failed - Last BP in normal range    BP Readings from Last 1 Encounters:  12/24/19 (!) 142/68          Passed - Valid encounter within last 12 months    Recent Outpatient Visits           9 months ago Essential hypertension   [place], MD   10 months ago Hyperlipidemia, unspecified hyperlipidemia type   [facility] Juline Patch, MD   1 year ago Essential hypertension   [facility] Juline Patch, MD   1 year ago Essential hypertension   [facility] Juline Patch, MD   1 year ago Dehydration   [facility] Juline Patch, MD

## 2020-10-06 ENCOUNTER — Ambulatory Visit: Payer: Medicare HMO | Admitting: Family Medicine

## 2020-10-06 ENCOUNTER — Telehealth: Payer: Self-pay

## 2020-10-06 NOTE — Telephone Encounter (Signed)
Left voicemail with deborah Bottger to call and set up appointment for [facility] for this week.

## 2020-10-06 NOTE — Telephone Encounter (Signed)
-----   Message from Fredderick Severance sent at 10/06/2020 11:20 AM EDT ----- This pt has no showed the past 2 times. He has to come in before anymore meds can be sent in. Please call and talk to [name] concerning this. Leave on schedule as a no show and schedule for this week

## 2020-10-10 DIAGNOSIS — H903 Sensorineural hearing loss, bilateral: Secondary | ICD-10-CM | POA: Diagnosis not present

## 2020-10-16 ENCOUNTER — Other Ambulatory Visit: Payer: Self-pay

## 2020-10-16 ENCOUNTER — Ambulatory Visit (INDEPENDENT_AMBULATORY_CARE_PROVIDER_SITE_OTHER): Payer: Medicare HMO | Admitting: Family Medicine

## 2020-10-16 ENCOUNTER — Encounter: Payer: Self-pay | Admitting: Family Medicine

## 2020-10-16 VITALS — BP 138/68 | HR 68 | Ht 70.0 in | Wt 149.0 lb

## 2020-10-16 DIAGNOSIS — H6123 Impacted cerumen, bilateral: Secondary | ICD-10-CM

## 2020-10-16 DIAGNOSIS — H903 Sensorineural hearing loss, bilateral: Secondary | ICD-10-CM | POA: Diagnosis not present

## 2020-10-16 NOTE — Progress Notes (Signed)
Date:  10/16/2020   Name:  Eric [name]   DOB:  09-30-1923   MRN:  [id number]   Chief Complaint: Cerumen Impaction  Patient is a 87 year old male who presents for a cerumen impaction exam. The patient reports the following problems: cerumen impaction. Health maintenance has been reviewed up to date.   Lab Results  Component Value Date   CREATININE 1.14 09/03/2019   BUN 23 09/03/2019   NA 139 09/03/2019   K 4.3 09/03/2019   CL 103 09/03/2019   CO2 24 09/03/2019   Lab Results  Component Value Date   CHOL 241 (H) 09/03/2019   HDL 71 09/03/2019   LDLCALC 149 (H) 09/03/2019   TRIG 122 09/03/2019   CHOLHDL 2.5 08/24/2017   No results found for: TSH No results found for: HGBA1C Lab Results  Component Value Date   WBC 3.8 (L) 12/20/2018   HGB 11.4 (L) 12/20/2018   HCT 35.1 (L) 12/20/2018   MCV 91.2 12/20/2018   PLT 197 12/20/2018   Lab Results  Component Value Date   ALT 13 09/03/2019   AST 18 09/03/2019   ALKPHOS 46 09/03/2019   BILITOT 0.3 09/03/2019     Review of Systems  Patient Active Problem List   Diagnosis Date Noted  . Essential hypertension 10/22/2015  . Hyperlipidemia 10/22/2015  . Benign prostatic hyperplasia 10/22/2015    No Known Allergies  Past Surgical History:  Procedure Laterality Date  . EYE SURGERY     cataract extraction  . HERNIA REPAIR Left 2014   inguinal hernia  . PACEMAKER INSERTION Left 12/27/2017   Procedure: INSERTION PACEMAKER-INITIAL DUAL CHAMBER;  Surgeon: Isaias Cowman, MD;  Location: [facility] ORS;  Service: Cardiovascular;  Laterality: Left;    Social History   Tobacco Use  . Smoking status: Never Smoker  . Smokeless tobacco: Never Used  Vaping Use  . Vaping Use: Never used  Substance Use Topics  . Alcohol use: No  . Drug use: No     Medication list has been reviewed and updated.  Current Meds  Medication Sig  . acetaminophen (TYLENOL) 325 MG tablet Take 650 mg by mouth every 6 (six) hours as needed.   Marland Kitchen amLODipine (NORVASC) 5 MG tablet   . fluticasone (FLONASE) 50 MCG/ACT nasal spray USE 1 SPRAY IN EACH NOSTRIL EVERY DAY  . losartan (COZAAR) 100 MG tablet Take 1 tablet by mouth daily.  Marland Kitchen omeprazole (PRILOSEC) 20 MG capsule TAKE 1 CAPSULE EVERY DAY  . simvastatin (ZOCOR) 40 MG tablet TAKE 1 TABLET (40 MG TOTAL) BY MOUTH AT BEDTIME.  . tamsulosin (FLOMAX) 0.4 MG CAPS capsule TAKE 1 CAPSULE EVERY DAY    PHQ 2/9 Scores 10/16/2020 02/04/2020 12/03/2019 09/03/2019  PHQ - 2 Score 0 0 0 0  PHQ- 9 Score 0 - 0 0    GAD 7 : Generalized Anxiety Score 10/16/2020 12/03/2019 09/03/2019  Nervous, Anxious, on Edge 0 0 0  Control/stop worrying 0 0 0  Worry too much - different things 0 0 0  Trouble relaxing 0 0 0  Restless 0 0 0  Easily annoyed or irritable 0 0 0  Afraid - awful might happen 0 0 0  Total GAD 7 Score 0 0 0    BP Readings from Last 3 Encounters:  10/16/20 (!) 152/80  12/24/19 (!) 142/68  12/03/19 138/78    Physical Exam  Wt Readings from Last 3 Encounters:  10/16/20 149 lb (67.6 kg)  12/24/19 150 lb (  68 kg)  12/03/19 153 lb (69.4 kg)    BP (!) 152/80   Pulse 68   Ht 5\' 10"  (1.778 m)   Wt 149 lb (67.6 kg)   BMI 21.38 kg/m   Assessment and Plan: 1. Impacted cerumen of both ears Chronic.  Episodic.  Patient has cerumen impaction bilateral for over a year.  Patient denies using Q-tips.  We were able to get a sizable amount out of 1 year but still has cerumen that is present and there is been no resolution at all with irrigation and his other ear.  Will refer to ear nose and throat for evaluation and removal. - Ambulatory referral to ENT

## 2020-11-11 ENCOUNTER — Other Ambulatory Visit: Payer: Self-pay | Admitting: Family Medicine

## 2020-11-11 DIAGNOSIS — J301 Allergic rhinitis due to pollen: Secondary | ICD-10-CM

## 2020-12-01 ENCOUNTER — Ambulatory Visit
Admission: EM | Admit: 2020-12-01 | Discharge: 2020-12-01 | Disposition: A | Discharge status: Home / Self Care | Payer: Medicare HMO | Attending: Emergency Medicine | Admitting: Emergency Medicine

## 2020-12-01 ENCOUNTER — Other Ambulatory Visit: Payer: Self-pay

## 2020-12-01 DIAGNOSIS — H6123 Impacted cerumen, bilateral: Secondary | ICD-10-CM | POA: Insufficient documentation

## 2020-12-01 DIAGNOSIS — R42 Dizziness and giddiness: Secondary | ICD-10-CM

## 2020-12-01 DIAGNOSIS — E860 Dehydration: Secondary | ICD-10-CM | POA: Insufficient documentation

## 2020-12-01 LAB — URINALYSIS, COMPLETE (UACMP) WITH MICROSCOPIC
Bilirubin Urine: NEGATIVE
Glucose, UA: NEGATIVE mg/dL
Ketones, ur: NEGATIVE mg/dL
Leukocytes,Ua: NEGATIVE
Nitrite: NEGATIVE
Protein, ur: NEGATIVE mg/dL
Specific Gravity, Urine: 1.02 (ref 1.005–1.030)
pH: 7 (ref 5.0–8.0)

## 2020-12-01 NOTE — ED Provider Notes (Signed)
[facility]    CSN: [id number] Arrival date & time: 12/01/20  0856      History   Chief Complaint Chief Complaint  Patient presents with   Dizziness    HPI [address] Eric [name] is a 87 y.o. male.   HPI  87 year old male here for evaluation of dizziness.  Patient reports that he has been experiencing dizziness every morning for the last 2 weeks.  He states it will be there when he gets up and then will resolve as the day goes on.  He has had some associated shortness of breath with this dizziness that 2 resolves as the day goes on.  She has been experiencing some nasal congestion and is complaining of aching in his right ear.  He is hard of hearing at baseline.  He does have a history of hypertension he states that he has been taking his medications.  He is also been using Flonase for his nasal congestion and denies any other over-the-counter cold medications.  Also denies chest pain, changes to his vision, or room spinning.  Patient also states that he does not feel as if he is off balance when he has the dizziness.  Patient denies headache.  Past Medical History:  Diagnosis Date   Arthritis    knees   Dysrhythmia    bradycardia   GERD (gastroesophageal reflux disease)    Hard of hearing    has a hearing aid but has not worn   High cholesterol    History of kidney stones    Hypertension    Prostate disorder     Patient Active Problem List   Diagnosis Date Noted   Essential hypertension 10/22/2015   Hyperlipidemia 10/22/2015   Benign prostatic hyperplasia 10/22/2015    Past Surgical History:  Procedure Laterality Date   EYE SURGERY     cataract extraction   HERNIA REPAIR Left 2014   inguinal hernia   PACEMAKER INSERTION Left 12/27/2017   Procedure: INSERTION PACEMAKER-INITIAL DUAL CHAMBER;  Surgeon: Isaias Cowman, MD;  Location: [facility] ORS;  Service: Cardiovascular;  Laterality: Left;       Home Medications    Prior to Admission medications    Medication Sig Start Date End Date Taking? Authorizing Provider  acetaminophen (TYLENOL) 325 MG tablet Take 650 mg by mouth every 6 (six) hours as needed.    [provider]  amLODipine (NORVASC) 5 MG tablet  08/26/20   [provider]  brimonidine (ALPHAGAN) 0.2 % ophthalmic solution  07/06/19   [provider]  fluticasone (FLONASE) 50 MCG/ACT nasal spray USE 1 SPRAY IN EACH NOSTRIL EVERY DAY 11/11/20   Juline Patch, MD  latanoprost (XALATAN) 0.005 % ophthalmic solution Place 1 drop into both eyes at bedtime. Patient not taking: Reported on 10/16/2020    [provider]  losartan (COZAAR) 100 MG tablet Take 1 tablet by mouth daily. 07/07/20   [provider]  omeprazole (PRILOSEC) 20 MG capsule TAKE 1 CAPSULE EVERY DAY 10/02/20   Juline Patch, MD  simvastatin (ZOCOR) 40 MG tablet TAKE 1 TABLET (40 MG TOTAL) BY MOUTH AT BEDTIME. 10/02/20   Juline Patch, MD  tamsulosin (FLOMAX) 0.4 MG CAPS capsule TAKE 1 CAPSULE EVERY DAY 10/02/20   Juline Patch, MD  timolol (BETIMOL) 0.25 % ophthalmic solution Place 1 drop into both eyes 2 (two) times daily. Patient not taking: Reported on 10/16/2020    [provider]  timolol (TIMOPTIC) 0.5 % ophthalmic solution SMARTSIG:In  Eye(s) 11/21/20   [provider]    Family History History reviewed. No pertinent family history.  Social History Social History   Tobacco Use   Smoking status: Never   Smokeless tobacco: Never  Vaping Use   Vaping Use: Never used  Substance Use Topics   Alcohol use: No   Drug use: No     Allergies   Patient has no known allergies.   Review of Systems Review of Systems  Constitutional:  Negative for activity change, appetite change and fever.  HENT:  Positive for congestion, ear pain and hearing loss.   Respiratory:  Positive for shortness of breath. Negative for cough.   Cardiovascular:  Negative for chest pain, palpitations and leg swelling.   Neurological:  Positive for dizziness. Negative for syncope and headaches.  Hematological: Negative.   Psychiatric/Behavioral: Negative.      Physical Exam Triage Vital Signs ED Triage Vitals  Enc Vitals Group     BP 12/01/20 0909 (!) 207/84     Pulse Rate 12/01/20 0909 62     Resp 12/01/20 0909 16     Temp 12/01/20 0909 98.2 F (36.8 C)     Temp Source 12/01/20 0909 Oral     SpO2 12/01/20 0909 100 %     Weight --      Height --      Head Circumference --      Peak Flow --      Pain Score 12/01/20 0905 0     Pain Loc --      Pain Edu? --      Excl. in [place]? --    No data found.  Updated Vital Signs BP (!) 209/78 (BP Location: Right Arm)   Pulse 62   Temp 98.2 F (36.8 C) (Oral)   Resp 16   SpO2 100%   Visual Acuity Right Eye Distance:   Left Eye Distance:   Bilateral Distance:    Right Eye Near:   Left Eye Near:    Bilateral Near:     Physical Exam Vitals and nursing note reviewed.  Constitutional:      General: He is not in acute distress.    Appearance: Normal appearance. He is normal weight. He is not ill-appearing.  HENT:     Head: Normocephalic and atraumatic.     Right Ear: There is impacted cerumen.     Left Ear: There is impacted cerumen.     Nose: Congestion and rhinorrhea present.     Mouth/Throat:     Mouth: Mucous membranes are moist.     Pharynx: Oropharynx is clear. No posterior oropharyngeal erythema.  Eyes:     Extraocular Movements: Extraocular movements intact.     Pupils: Pupils are equal, round, and reactive to light.  Cardiovascular:     Rate and Rhythm: Normal rate and regular rhythm.     Pulses: Normal pulses.     Heart sounds: Normal heart sounds. No murmur heard.   No gallop.  Pulmonary:     Effort: Pulmonary effort is normal.     Breath sounds: Normal breath sounds. No wheezing, rhonchi or rales.  Musculoskeletal:     Right lower leg: No edema.     Left lower leg: No edema.  Skin:    General: Skin is warm and dry.      Capillary Refill: Capillary refill takes less than 2 seconds.     Findings: No erythema.  Neurological:     General: No focal  deficit present.     Mental Status: He is alert and oriented to person, place, and time.  Psychiatric:        Mood and Affect: Mood normal.        Behavior: Behavior normal.        Thought Content: Thought content normal.        Judgment: Judgment normal.     UC Treatments / Results  Labs (all labs ordered are listed, but only abnormal results are displayed) Labs Reviewed  URINALYSIS, COMPLETE (UACMP) WITH MICROSCOPIC - Abnormal; Notable for the following components:      Result Value   Hgb urine dipstick SMALL (*)    Bacteria, UA FEW (*)    All other components within normal limits    EKG   Radiology No results found.  Procedures Procedures (including critical care time)  Medications Ordered in UC Medications - No data to display  Initial Impression / Assessment and Plan / UC Course  I have reviewed the triage vital signs and the nursing notes.  Pertinent labs & imaging results that were available during my care of the patient were reviewed by me and considered in my medical decision making (see chart for details).  Patient is a very pleasant and nontoxic-appearing 87 year old male who is very hard of hearing and is here for evaluation of dizziness.  He reports that the dizziness occurs in the mornings only and resolves as the day goes on.  This is associated with some shortness of breath at that time but it to resolve as the day goes on.  He is not had any chest pain or leg swelling.  He is not using any over-the-counter medications for the nasal congestion has been experiencing only his Flonase.  When asked about with the he feels off balance with the room spins she denies either 1 of those just says that he feels like his head is congested.  Patient's physical exam reveals impacted cerumen in both external auditory canals.  Nasal mucosa is pale and  edematous with clear nasal discharge.  Oropharyngeal exam is benign.  Cardiopulmonary exam is benign.  Patient has no carotid bruits and has normal S1, S2 heart sounds.  No peripheral edema.  Global pulses peripherally are 2+.  There is no swelling in any of his extremities.  Suspect patient's dizziness may be secondary to his nasal congestion or possibly hydration.  Patient does have sticky or mucous membranes.  Will order EKG, UA, and ear lavage to remove cerumen impaction so that I can reassess tympanic membrane's for any middle ear fluid.  EKG shows dual-chamber AV sequential pacemaker and is noncontributory.  Urinalysis shows a specific gravity of 1.020 and small hemoglobin.  Patient has a history of blood in his urine going back to 2018.  Remainder the urinalysis is negative.  Patient was able to tolerate the ear irrigation so will refer to ENT for cerumen impaction.  I suspect patient's dizziness is coming from poor hydration and will encourage patient to increase his oral fluid intake to eight 8 ounce glasses of water daily to see if this helps his symptoms.  I will also patient follow-up with his primary care provider.   Final Clinical Impressions(s) / UC Diagnoses   Final diagnoses:  Dizziness  Dehydration  Bilateral impacted cerumen     Discharge Instructions      Believe that your dizziness is coming from a lack of adequate hydration.  Which you to increase your oral water intake  to a goal of eight 8 ounce glasses a day.  For the earwax buildup in your ears, and your hearing difficulties, I have sent a referral to [facility].  You can also call the office listed below to make an appointment for evaluation and to have your ears irrigated.  This might help with your hearing issues.  If you have any increase in your dizziness, the dizziness comes on and stays on, it is associated with a headache, or changes in her vision you need to go to the emergency department for  evaluation.     ED Prescriptions   None    PDMP not reviewed this encounter.   Margarette Canada, NP 12/01/20 1105

## 2020-12-01 NOTE — Discharge Instructions (Addendum)
Believe that your dizziness is coming from a lack of adequate hydration.  Which you to increase your oral water intake to a goal of eight 8 ounce glasses a day.  For the earwax buildup in your ears, and your hearing difficulties, I have sent a referral to [facility].  You can also call the office listed below to make an appointment for evaluation and to have your ears irrigated.  This might help with your hearing issues.  If you have any increase in your dizziness, the dizziness comes on and stays on, it is associated with a headache, or changes in her vision you need to go to the emergency department for evaluation.

## 2020-12-01 NOTE — ED Triage Notes (Addendum)
Patient presents to Urgent Care with complaints of dizziness x 2 weeks. Pt says no other symptoms. BP baseline runs in the 140's with his medications.   Denies chest pain, SOB, changes in vision.

## 2020-12-10 ENCOUNTER — Ambulatory Visit (INDEPENDENT_AMBULATORY_CARE_PROVIDER_SITE_OTHER): Payer: Medicare HMO | Admitting: Family Medicine

## 2020-12-10 ENCOUNTER — Other Ambulatory Visit: Payer: Self-pay

## 2020-12-10 ENCOUNTER — Encounter: Payer: Self-pay | Admitting: Family Medicine

## 2020-12-10 DIAGNOSIS — E785 Hyperlipidemia, unspecified: Secondary | ICD-10-CM

## 2020-12-10 DIAGNOSIS — J301 Allergic rhinitis due to pollen: Secondary | ICD-10-CM

## 2020-12-10 DIAGNOSIS — N401 Enlarged prostate with lower urinary tract symptoms: Secondary | ICD-10-CM

## 2020-12-10 DIAGNOSIS — K219 Gastro-esophageal reflux disease without esophagitis: Secondary | ICD-10-CM

## 2020-12-10 MED ORDER — FLUTICASONE PROPIONATE 50 MCG/ACT NA SUSP: NASAL | 1 refills | Status: DC

## 2020-12-10 MED ORDER — SIMVASTATIN 40 MG PO TABS: 40.0000 mg | ORAL_TABLET | Freq: Every day | ORAL | 1 refills | Status: DC

## 2020-12-10 MED ORDER — TAMSULOSIN HCL 0.4 MG PO CAPS: 0.4000 mg | ORAL_CAPSULE | Freq: Every day | ORAL | 1 refills | Status: DC

## 2020-12-10 MED ORDER — OMEPRAZOLE 20 MG PO CPDR: 20.0000 mg | DELAYED_RELEASE_CAPSULE | Freq: Every day | ORAL | 1 refills | Status: DC

## 2020-12-10 NOTE — Progress Notes (Signed)
Date:  12/10/2020   Name:  Eric [name]   DOB:  Apr 15, 1924   MRN:  [id number]   Chief Complaint: Gastroesophageal Reflux, Allergic Rhinitis , Hyperlipidemia, and Benign Prostatic Hypertrophy  Gastroesophageal Reflux He reports no abdominal pain, no belching, no chest pain, no choking, no coughing, no dysphagia, no heartburn, no hoarse voice, no nausea, no sore throat or no wheezing. The current episode started more than 1 year ago. The problem has been gradually improving. He has tried a PPI for the symptoms. The treatment provided moderate relief.  Hyperlipidemia This is a chronic problem. The current episode started more than 1 year ago. The problem is controlled. Recent lipid tests were reviewed and are normal. Pertinent negatives include no chest pain, myalgias or shortness of breath. Current antihyperlipidemic treatment includes statins.  Benign Prostatic Hypertrophy This is a chronic problem. The current episode started more than 1 year ago. The problem has been gradually improving since onset. Irritative symptoms do not include frequency, nocturia or urgency. Obstructive symptoms do not include dribbling, incomplete emptying, an intermittent stream, a slower stream, straining or a weak stream. Pertinent negatives include no chills, dysuria, hematuria or nausea. Past treatments include tamsulosin. The treatment provided moderate relief.   Lab Results  Component Value Date   CREATININE 1.14 09/03/2019   BUN 23 09/03/2019   NA 139 09/03/2019   K 4.3 09/03/2019   CL 103 09/03/2019   CO2 24 09/03/2019   Lab Results  Component Value Date   CHOL 241 (H) 09/03/2019   HDL 71 09/03/2019   LDLCALC 149 (H) 09/03/2019   TRIG 122 09/03/2019   CHOLHDL 2.5 08/24/2017   No results found for: TSH No results found for: HGBA1C Lab Results  Component Value Date   WBC 3.8 (L) 12/20/2018   HGB 11.4 (L) 12/20/2018   HCT 35.1 (L) 12/20/2018   MCV 91.2 12/20/2018   PLT 197 12/20/2018    Lab Results  Component Value Date   ALT 13 09/03/2019   AST 18 09/03/2019   ALKPHOS 46 09/03/2019   BILITOT 0.3 09/03/2019     Review of Systems  Constitutional:  Negative for chills and fever.  HENT:  Negative for drooling, ear discharge, ear pain, hoarse voice and sore throat.   Respiratory:  Negative for cough, choking, shortness of breath and wheezing.   Cardiovascular:  Negative for chest pain, palpitations and leg swelling.  Gastrointestinal:  Negative for abdominal pain, blood in stool, constipation, diarrhea, dysphagia, heartburn and nausea.  Endocrine: Negative for polydipsia.  Genitourinary:  Negative for dysuria, frequency, hematuria, incomplete emptying, nocturia and urgency.  Musculoskeletal:  Negative for back pain, myalgias and neck pain.  Skin:  Negative for rash.  Allergic/Immunologic: Negative for environmental allergies.  Neurological:  Negative for dizziness and headaches.  Hematological:  Does not bruise/bleed easily.  Psychiatric/Behavioral:  Negative for suicidal ideas. The patient is not nervous/anxious.    Patient Active Problem List   Diagnosis Date Noted  . Essential hypertension 10/22/2015  . Hyperlipidemia 10/22/2015  . Benign prostatic hyperplasia 10/22/2015    No Known Allergies  Past Surgical History:  Procedure Laterality Date  . EYE SURGERY     cataract extraction  . HERNIA REPAIR Left 2014   inguinal hernia  . PACEMAKER INSERTION Left 12/27/2017   Procedure: INSERTION PACEMAKER-INITIAL DUAL CHAMBER;  Surgeon: Isaias Cowman, MD;  Location: [facility] ORS;  Service: Cardiovascular;  Laterality: Left;    Social History   Tobacco Use  . Smoking  status: Never  . Smokeless tobacco: Never  Vaping Use  . Vaping Use: Never used  Substance Use Topics  . Alcohol use: No  . Drug use: No     Medication list has been reviewed and updated.  Current Meds  Medication Sig  . acetaminophen (TYLENOL) 325 MG tablet Take 650 mg by mouth  every 6 (six) hours as needed.  Marland Kitchen amLODipine (NORVASC) 5 MG tablet   . brimonidine (ALPHAGAN) 0.2 % ophthalmic solution   . fluticasone (FLONASE) 50 MCG/ACT nasal spray USE 1 SPRAY IN EACH NOSTRIL EVERY DAY  . latanoprost (XALATAN) 0.005 % ophthalmic solution Place 1 drop into both eyes at bedtime.  Marland Kitchen losartan (COZAAR) 100 MG tablet Take 1 tablet by mouth daily.  Marland Kitchen omeprazole (PRILOSEC) 20 MG capsule TAKE 1 CAPSULE EVERY DAY  . simvastatin (ZOCOR) 40 MG tablet TAKE 1 TABLET (40 MG TOTAL) BY MOUTH AT BEDTIME.  . tamsulosin (FLOMAX) 0.4 MG CAPS capsule TAKE 1 CAPSULE EVERY DAY  . timolol (TIMOPTIC) 0.5 % ophthalmic solution SMARTSIG:In Eye(s)  . [DISCONTINUED] timolol (BETIMOL) 0.25 % ophthalmic solution Place 1 drop into both eyes 2 (two) times daily.    PHQ 2/9 Scores 12/10/2020 10/16/2020 02/04/2020 12/03/2019  PHQ - 2 Score 2 0 0 0  PHQ- 9 Score 5 0 - 0    GAD 7 : Generalized Anxiety Score 12/10/2020 10/16/2020 12/03/2019 09/03/2019  Nervous, Anxious, on Edge 0 0 0 0  Control/stop worrying 0 0 0 0  Worry too much - different things 0 0 0 0  Trouble relaxing 0 0 0 0  Restless 0 0 0 0  Easily annoyed or irritable 0 0 0 0  Afraid - awful might happen 0 0 0 0  Total GAD 7 Score 0 0 0 0    BP Readings from Last 3 Encounters:  12/10/20 130/78  12/01/20 (!) 209/78  10/16/20 138/68    Physical Exam Vitals and nursing note reviewed.  HENT:     Head: Normocephalic.     Right Ear: Tympanic membrane, ear canal and external ear normal.     Left Ear: Tympanic membrane, ear canal and external ear normal.     Nose: Nose normal.     Mouth/Throat:     Mouth: Mucous membranes are moist.  Eyes:     General: No scleral icterus.       Right eye: No discharge.        Left eye: No discharge.     Conjunctiva/sclera: Conjunctivae normal.     Pupils: Pupils are equal, round, and reactive to light.  Neck:     Thyroid: No thyromegaly.     Vascular: No JVD.     Trachea: No tracheal deviation.   Cardiovascular:     Rate and Rhythm: Normal rate and regular rhythm.     Heart sounds: Normal heart sounds. No murmur heard.   No friction rub. No gallop.  Pulmonary:     Effort: No respiratory distress.     Breath sounds: Normal breath sounds. No wheezing, rhonchi or rales.  Abdominal:     General: Bowel sounds are normal.     Palpations: Abdomen is soft. There is no mass.     Tenderness: There is no abdominal tenderness. There is no guarding or rebound.  Musculoskeletal:        General: No tenderness. Normal range of motion.     Cervical back: Normal range of motion and neck supple.  Lymphadenopathy:  Cervical: No cervical adenopathy.  Skin:    General: Skin is warm.     Findings: No rash.  Neurological:     Mental Status: He is alert and oriented to person, place, and time.     Cranial Nerves: No cranial nerve deficit.     Deep Tendon Reflexes: Reflexes are normal and symmetric.    Wt Readings from Last 3 Encounters:  12/10/20 142 lb (64.4 kg)  10/16/20 149 lb (67.6 kg)  12/24/19 150 lb (68 kg)    BP 130/78   Pulse 72   Ht 5\' 10"  (1.778 m)   Wt 142 lb (64.4 kg)   BMI 20.37 kg/m   Assessment and Plan:  1. Seasonal allergic rhinitis due to pollen Chronic.  Controlled.  Stable.  Continue Flonase 1 spray in each nostril daily. - fluticasone (FLONASE) 50 MCG/ACT nasal spray; USE 1 SPRAY IN EACH NOSTRIL EVERY DAY  Dispense: 32 g; Refill: 1  2. Gastroesophageal reflux disease Chronic.  Controlled.  Stable.  Continue omeprazole 20 mg once a day. - omeprazole (PRILOSEC) 20 MG capsule; Take 1 capsule (20 mg total) by mouth daily.  Dispense: 90 capsule; Refill: 1  3. Hyperlipidemia, unspecified hyperlipidemia type Chronic.  Controlled.  Stable.  Continue simvastatin 40 mg once a day.  Will check lipid panel and CMP. - simvastatin (ZOCOR) 40 MG tablet; Take 1 tablet (40 mg total) by mouth at bedtime.  Dispense: 90 tablet; Refill: 1 - Lipid Panel With LDL/HDL Ratio -  Comprehensive Metabolic Panel (CMET)  4. Benign prostatic hyperplasia with lower urinary tract symptoms, symptom details unspecified Chronic.  Controlled.  Stable.  Continue tamsulosin 0.4 mg once a day.  DRE was normal today with normal size shape and consistency of prostate we will check PSA for current status. - tamsulosin (FLOMAX) 0.4 MG CAPS capsule; Take 1 capsule (0.4 mg total) by mouth daily.  Dispense: 90 capsule; Refill: 1 - PSA

## 2020-12-11 LAB — COMPREHENSIVE METABOLIC PANEL
ALT: 11 IU/L (ref 0–44)
AST: 15 IU/L (ref 0–40)
Albumin/Globulin Ratio: 1.5 (ref 1.2–2.2)
Albumin: 4.1 g/dL (ref 3.5–4.6)
Alkaline Phosphatase: 47 IU/L (ref 44–121)
BUN/Creatinine Ratio: 20 (ref 10–24)
BUN: 23 mg/dL (ref 10–36)
Bilirubin Total: 0.3 mg/dL (ref 0.0–1.2)
CO2: 23 mmol/L (ref 20–29)
Calcium: 8.8 mg/dL (ref 8.6–10.2)
Chloride: 104 mmol/L (ref 96–106)
Creatinine, Ser: 1.13 mg/dL (ref 0.76–1.27)
Globulin, Total: 2.8 g/dL (ref 1.5–4.5)
Glucose: 95 mg/dL (ref 65–99)
Potassium: 4.5 mmol/L (ref 3.5–5.2)
Sodium: 141 mmol/L (ref 134–144)
Total Protein: 6.9 g/dL (ref 6.0–8.5)
eGFR: 60 mL/min/{1.73_m2} (ref >59)

## 2020-12-11 LAB — LIPID PANEL WITH LDL/HDL RATIO
Cholesterol, Total: 185 mg/dL (ref 100–199)
HDL: 83 mg/dL (ref >39)
LDL Chol Calc (NIH): 91 mg/dL (ref 0–99)
LDL/HDL Ratio: 1.1 ratio (ref 0.0–3.6)
Triglycerides: 56 mg/dL (ref 0–149)
VLDL Cholesterol Cal: 11 mg/dL (ref 5–40)

## 2020-12-11 LAB — PSA: Prostate Specific Ag, Serum: 2.5 ng/mL (ref 0.0–4.0)

## 2021-01-23 ENCOUNTER — Telehealth: Payer: Self-pay | Admitting: Family Medicine

## 2021-01-23 NOTE — Telephone Encounter (Signed)
Copied from [place] [id number]. Topic: Medicare AWV >> Jan 23, 2021 12:05 PM Cher Nakai R wrote: Reason for CRM:  No answer unable to leave a message for patient to call back and schedule Medicare Annual Wellness Visit (AWV) in office.   If unable to come into the office for AWV,  please offer to do virtually or by telephone.  Last AWV: 02/04/2020  Please schedule at anytime with [facility] Health Advisor.  40 minute appointment  Any questions, please contact me at [phone]

## 2021-02-02 DIAGNOSIS — H903 Sensorineural hearing loss, bilateral: Secondary | ICD-10-CM | POA: Diagnosis not present

## 2021-02-02 DIAGNOSIS — H6123 Impacted cerumen, bilateral: Secondary | ICD-10-CM | POA: Diagnosis not present

## 2021-03-16 ENCOUNTER — Encounter: Payer: Self-pay | Admitting: Family Medicine

## 2021-03-16 ENCOUNTER — Other Ambulatory Visit: Payer: Self-pay

## 2021-03-16 ENCOUNTER — Ambulatory Visit (INDEPENDENT_AMBULATORY_CARE_PROVIDER_SITE_OTHER): Payer: Medicare HMO | Admitting: Family Medicine

## 2021-03-16 VITALS — BP 130/80 | HR 80 | Ht 70.0 in | Wt 144.0 lb

## 2021-03-16 DIAGNOSIS — Z23 Encounter for immunization: Secondary | ICD-10-CM

## 2021-03-16 DIAGNOSIS — M19072 Primary osteoarthritis, left ankle and foot: Secondary | ICD-10-CM

## 2021-03-16 DIAGNOSIS — L309 Dermatitis, unspecified: Secondary | ICD-10-CM

## 2021-03-16 MED ORDER — PREDNISONE 10 MG PO TABS: 10.0000 mg | ORAL_TABLET | Freq: Every day | ORAL | 0 refills | Status: DC

## 2021-03-16 MED ORDER — TRIAMCINOLONE ACETONIDE 0.1 % EX CREA: 1.0000 "application " | TOPICAL_CREAM | Freq: Two times a day (BID) | CUTANEOUS | 1 refills | Status: DC

## 2021-03-16 NOTE — Progress Notes (Signed)
Date:  03/16/2021   Name:  Eric [name]   DOB:  [birth year]   MRN:  [id number]   Chief Complaint: Ankle Pain (L) ankle pain- discoloration above ankle x 2 months. Itches and sore. Used triple antibiotic cream on it and it goot better, but now is back)  Ankle Pain    Lab Results  Component Value Date   CREATININE 1.13 12/10/2020   BUN 23 12/10/2020   NA 141 12/10/2020   K 4.5 12/10/2020   CL 104 12/10/2020   CO2 23 12/10/2020   Lab Results  Component Value Date   CHOL 185 12/10/2020   HDL 83 12/10/2020   LDLCALC 91 12/10/2020   TRIG 56 12/10/2020   CHOLHDL 2.5 08/24/2017   No results found for: TSH No results found for: HGBA1C Lab Results  Component Value Date   WBC 3.8 (L) 12/20/2018   HGB 11.4 (L) 12/20/2018   HCT 35.1 (L) 12/20/2018   MCV 91.2 12/20/2018   PLT 197 12/20/2018   Lab Results  Component Value Date   ALT 11 12/10/2020   AST 15 12/10/2020   ALKPHOS 47 12/10/2020   BILITOT 0.3 12/10/2020     Review of Systems  Patient Active Problem List   Diagnosis Date Noted   Essential hypertension 10/22/2015   Hyperlipidemia 10/22/2015   Benign prostatic hyperplasia 10/22/2015    No Known Allergies  Past Surgical History:  Procedure Laterality Date   EYE SURGERY     cataract extraction   HERNIA REPAIR Left 2014   inguinal hernia   PACEMAKER INSERTION Left 12/27/2017   Procedure: INSERTION PACEMAKER-INITIAL DUAL CHAMBER;  Surgeon: Isaias Cowman, MD;  Location: [facility] ORS;  Service: Cardiovascular;  Laterality: Left;    Social History   Tobacco Use   Smoking status: Never   Smokeless tobacco: Never  Vaping Use   Vaping Use: Never used  Substance Use Topics   Alcohol use: No   Drug use: No     Medication list has been reviewed and updated.  Current Meds  Medication Sig   acetaminophen (TYLENOL) 325 MG tablet Take 650 mg by mouth every 6 (six) hours as needed.   amLODipine (NORVASC) 5 MG tablet    brimonidine (ALPHAGAN) 0.2 %  ophthalmic solution    fluticasone (FLONASE) 50 MCG/ACT nasal spray USE 1 SPRAY IN EACH NOSTRIL EVERY DAY   latanoprost (XALATAN) 0.005 % ophthalmic solution Place 1 drop into both eyes at bedtime.   losartan (COZAAR) 100 MG tablet Take 1 tablet by mouth daily.   omeprazole (PRILOSEC) 20 MG capsule Take 1 capsule (20 mg total) by mouth daily.   simvastatin (ZOCOR) 40 MG tablet Take 1 tablet (40 mg total) by mouth at bedtime.   tamsulosin (FLOMAX) 0.4 MG CAPS capsule Take 1 capsule (0.4 mg total) by mouth daily.   timolol (TIMOPTIC) 0.5 % ophthalmic solution SMARTSIG:In Eye(s)    PHQ 2/9 Scores 12/10/2020 10/16/2020 02/04/2020 12/03/2019  PHQ - 2 Score 2 0 0 0  PHQ- 9 Score 5 0 - 0    GAD 7 : Generalized Anxiety Score 12/10/2020 10/16/2020 12/03/2019 09/03/2019  Nervous, Anxious, on Edge 0 0 0 0  Control/stop worrying 0 0 0 0  Worry too much - different things 0 0 0 0  Trouble relaxing 0 0 0 0  Restless 0 0 0 0  Easily annoyed or irritable 0 0 0 0  Afraid - awful might happen 0 0 0 0  Total GAD 7  Score 0 0 0 0    BP Readings from Last 3 Encounters:  03/16/21 130/80  12/10/20 130/78  12/01/20 (!) 209/78    Physical Exam Vitals and nursing note reviewed.  HENT:     Head: Normocephalic.     Right Ear: Tympanic membrane and external ear normal.     Left Ear: Tympanic membrane and external ear normal.     Nose: Nose normal.  Eyes:     General: No scleral icterus.       Right eye: No discharge.        Left eye: No discharge.     Conjunctiva/sclera: Conjunctivae normal.     Pupils: Pupils are equal, round, and reactive to light.  Neck:     Thyroid: No thyromegaly.     Vascular: No JVD.     Trachea: No tracheal deviation.  Cardiovascular:     Rate and Rhythm: Normal rate and regular rhythm.     Heart sounds: Normal heart sounds. No murmur heard.   No friction rub. No gallop.  Pulmonary:     Effort: No respiratory distress.     Breath sounds: Normal breath sounds. No wheezing,  rhonchi or rales.  Abdominal:     General: Bowel sounds are normal.     Palpations: Abdomen is soft. There is no mass.     Tenderness: There is no abdominal tenderness. There is no guarding or rebound.  Musculoskeletal:        General: Normal range of motion.     Cervical back: Normal range of motion and neck supple.     Left ankle: Tenderness present.     Comments: Tenderness below area of lichenification  Lymphadenopathy:     Cervical: No cervical adenopathy.  Skin:    General: Skin is warm.     Findings: No bruising, ecchymosis or erythema.          Comments: Lichenification medial upper ankle  Neurological:     Mental Status: He is alert and oriented to person, place, and time.     Cranial Nerves: No cranial nerve deficit.     Deep Tendon Reflexes: Reflexes are normal and symmetric.    Wt Readings from Last 3 Encounters:  03/16/21 144 lb (65.3 kg)  12/10/20 142 lb (64.4 kg)  10/16/20 149 lb (67.6 kg)    BP 130/80   Pulse 80   Ht 5\' 10"  (1.778 m)   Wt 144 lb (65.3 kg)   BMI 20.66 kg/m   Assessment and Plan:  1. Eczema of lower leg Chronic.  Persistent.  Area of lichenification for several months has developed probably due to chronic friction or neurodermatitis which has caused thickening of the skin in this area.  We will treat with triamcinolone cream and prednisone as that this may be eczema related. - triamcinolone cream (KENALOG) 0.1 %; Apply 1 application topically 2 (two) times daily.  Dispense: 30 g; Refill: 1 - predniSONE (DELTASONE) 10 MG tablet; Take 1 tablet (10 mg total) by mouth daily with breakfast.  Dispense: 30 tablet; Refill: 0  2. Arthritis of left ankle For the last several days patient has had soreness of his left ankle which is tender away from the lichenification which is consistent with arthritis whether it be of a gout versus osteonature.  Given the prednisone will be sufficient to handle this and we will hold on NSAIDs at this time.  3. Need  for immunization against influenza Discussed and administered. - Flu Vaccine QUAD  High Dose(Fluad)

## 2021-03-27 ENCOUNTER — Other Ambulatory Visit: Payer: Self-pay | Admitting: Family Medicine

## 2021-03-27 DIAGNOSIS — E785 Hyperlipidemia, unspecified: Secondary | ICD-10-CM

## 2021-03-27 MED ORDER — LOSARTAN POTASSIUM 100 MG PO TABS: 100.0000 mg | ORAL_TABLET | Freq: Every day | ORAL | 0 refills | Status: DC

## 2021-03-27 MED ORDER — SIMVASTATIN 40 MG PO TABS: 40.0000 mg | ORAL_TABLET | Freq: Every day | ORAL | 0 refills | Status: DC

## 2021-03-27 NOTE — Telephone Encounter (Signed)
Requested medication (s) are due for refill today: Yes  Requested medication (s) are on the active medication list: Yes  Last refill:  10/16/20  Future visit scheduled: Yes  Notes to clinic:  Patient without medication waiting on mail order to arrive, requesting supply sent to local pharmacy, unable to refill due to last refilled by another provider.     Requested Prescriptions  Pending Prescriptions Disp Refills   losartan (COZAAR) 100 MG tablet      Sig: Take 1 tablet (100 mg total) by mouth daily.     Cardiovascular:  Angiotensin Receptor Blockers Passed - 03/27/2021 11:34 AM      Passed - Cr in normal range and within 180 days    Creatinine  Date Value Ref Range Status  11/06/2013 0.78 0.60 - 1.30 mg/dL Final   Creatinine, Ser  Date Value Ref Range Status  12/10/2020 1.13 0.76 - 1.27 mg/dL Final          Passed - K in normal range and within 180 days    Potassium  Date Value Ref Range Status  12/10/2020 4.5 3.5 - 5.2 mmol/L Final  11/06/2013 4.1 3.5 - 5.1 mmol/L Final          Passed - Patient is not pregnant      Passed - Last BP in normal range    BP Readings from Last 1 Encounters:  03/16/21 130/80          Passed - Valid encounter within last 6 months    Recent Outpatient Visits           1 week ago Eczema of lower leg   [facility] Juline Patch, MD   3 months ago Seasonal allergic rhinitis due to pollen   [facility] Juline Patch, MD   5 months ago Impacted cerumen of both ears   [facility] Juline Patch, MD   1 year ago Essential hypertension   [facility] Juline Patch, MD   1 year ago Hyperlipidemia, unspecified hyperlipidemia type   [facility] Juline Patch, MD       Future Appointments             In 2 months Juline Patch, MD [facility]            Signed Prescriptions Disp Refills   simvastatin (ZOCOR) 40 MG tablet 30 tablet 0    Sig: Take 1 tablet  (40 mg total) by mouth at bedtime.     Cardiovascular:  Antilipid - Statins Passed - 03/27/2021 11:34 AM      Passed - Total Cholesterol in normal range and within 360 days    Cholesterol, Total  Date Value Ref Range Status  12/10/2020 185 100 - 199 mg/dL Final          Passed - LDL in normal range and within 360 days    LDL Chol Calc (NIH)  Date Value Ref Range Status  12/10/2020 91 0 - 99 mg/dL Final          Passed - HDL in normal range and within 360 days    HDL  Date Value Ref Range Status  12/10/2020 83 >39 mg/dL Final          Passed - Triglycerides in normal range and within 360 days    Triglycerides  Date Value Ref Range Status  12/10/2020 56 0 - 149 mg/dL Final  Passed - Patient is not pregnant      Passed - Valid encounter within last 12 months    Recent Outpatient Visits           1 week ago Eczema of lower leg   [facility] Juline Patch, MD   3 months ago Seasonal allergic rhinitis due to pollen   [place], MD   5 months ago Impacted cerumen of both ears   [facility] Juline Patch, MD   1 year ago Essential hypertension   [place], MD   1 year ago Hyperlipidemia, unspecified hyperlipidemia type   [facility] Juline Patch, MD       Future Appointments             In 2 months Juline Patch, MD [place]

## 2021-03-27 NOTE — Telephone Encounter (Signed)
Refilling 30 days of Simvastatin to cover until mail order supply arrives.

## 2021-03-27 NOTE — Telephone Encounter (Signed)
Pt's niece called to report that they have prescriptions ready for him at the pharmacy.  They have simvastatin, tamsulosin, and omeprazole already at the  [place], [place] - [address]  [place] [address]  Phone: [phone] Fax: [phone]   All they need is the losartan currently.

## 2021-03-27 NOTE — Telephone Encounter (Signed)
Copied from [place] [id number]. Topic: Quick Communication - Rx Refill/Question >> Mar 27, 2021 11:14 AM Yvette Rack wrote: Medication: simvastatin (ZOCOR) 40 MG tablet and losartan (COZAAR) 100 MG tablet   Has the patient contacted their pharmacy? Yes.  Pt has mail order pharmacy and his prescriptions have not been delivered. Pt has been without medications for 2-3 days now.  (Agent: If no, request that the patient contact the pharmacy for the refill. If patient does not wish to contact the pharmacy document the reason why and proceed with request.) (Agent: If yes, when and what did the pharmacy advise?)  Preferred Pharmacy (with phone number or street name): [place], [place] Phone: [phone]  Fax: [phone]  Has the patient been seen for an appointment in the last year OR does the patient have an upcoming appointment? Yes.    Agent: Please be advised that RX refills may take up to 3 business days. We ask that you follow-up with your pharmacy.

## 2021-04-08 DIAGNOSIS — I5022 Chronic systolic (congestive) heart failure: Secondary | ICD-10-CM | POA: Diagnosis not present

## 2021-04-20 ENCOUNTER — Encounter: Payer: Self-pay | Admitting: Family Medicine

## 2021-04-20 ENCOUNTER — Ambulatory Visit (INDEPENDENT_AMBULATORY_CARE_PROVIDER_SITE_OTHER): Payer: Medicare HMO | Admitting: Family Medicine

## 2021-04-20 ENCOUNTER — Ambulatory Visit: Payer: Self-pay

## 2021-04-20 ENCOUNTER — Other Ambulatory Visit: Payer: Self-pay

## 2021-04-20 VITALS — BP 112/64 | HR 61 | Ht 70.0 in | Wt 144.0 lb

## 2021-04-20 DIAGNOSIS — R399 Unspecified symptoms and signs involving the genitourinary system: Secondary | ICD-10-CM

## 2021-04-20 DIAGNOSIS — I5022 Chronic systolic (congestive) heart failure: Secondary | ICD-10-CM

## 2021-04-20 LAB — POCT URINALYSIS DIPSTICK
Bilirubin, UA: NEGATIVE
Glucose, UA: NEGATIVE
Ketones, UA: NEGATIVE
Nitrite, UA: NEGATIVE
Protein, UA: POSITIVE — AB
Spec Grav, UA: >=1.03 — AB (ref 1.010–1.025)
Urobilinogen, UA: 2 E.U./dL — AB
pH, UA: 6 (ref 5.0–8.0)

## 2021-04-20 MED ORDER — DOXYCYCLINE HYCLATE 100 MG PO TABS: 100.0000 mg | ORAL_TABLET | Freq: Two times a day (BID) | ORAL | 0 refills | Status: DC

## 2021-04-20 NOTE — Telephone Encounter (Signed)
Spoke with niece, Marliss Coots with pt's permission.  Pt has mild to moderate SOB. Pt was able to speak to me with full sentences. PT has difficulty hearing.  Niece is also concerned about low BP's.   Made appointment to see Dr. Ronnald Ramp this afternoon.   Niece will stay with pt and take him to the appointment. Niece will seek immediate care if pt becomes worse.   Pt is also complaining of burning with urination.

## 2021-04-20 NOTE — Telephone Encounter (Signed)
Reason for Disposition  [1] MILD difficulty breathing (e.g., minimal/no SOB at rest, SOB with walking, pulse <100) AND [2] NEW-onset or WORSE than normal  Answer Assessment - Initial Assessment Questions 1. RESPIRATORY STATUS: "Describe your breathing?" (e.g., wheezing, shortness of breath, unable to speak, severe coughing)      Seems to have a bit of trouble catching his breath . Pt is taking deep breaths. 2. ONSET: "When did this breathing problem begin?"      A couple days. 3. PATTERN "Does the difficult breathing come and go, or has it been constant since it started?"      Off and on 4. SEVERITY: "How bad is your breathing?" (e.g., mild, moderate, severe)    - MILD: No SOB at rest, mild SOB with walking, speaks normally in sentences, can lie down, no retractions, pulse < 100.    - MODERATE: SOB at rest, SOB with minimal exertion and prefers to sit, cannot lie down flat, speaks in phrases, mild retractions, audible wheezing, pulse 100-120.    - SEVERE: Very SOB at rest, speaks in single words, struggling to breathe, sitting hunched forward, retractions, pulse > 120      Mild to moderate 5. RECURRENT SYMPTOM: "Have you had difficulty breathing before?" If Yes, ask: "When was the last time?" and "What happened that time?"      yes 6. CARDIAC HISTORY: "Do you have any history of heart disease?" (e.g., heart attack, angina, bypass surgery, angioplasty)      Pacemaker 7. LUNG HISTORY: "Do you have any history of lung disease?"  (e.g., pulmonary embolus, asthma, emphysema)     no 8. CAUSE: "What do you think is causing the breathing problem?"      Unknown - BP on the lower side 120/54,115/49,103/45 9. OTHER SYMPTOMS: "Do you have any other symptoms? (e.g., dizziness, runny nose, cough, chest pain, fever)     Dizzy 10. O2 SATURATION MONITOR:  "Do you use an oxygen saturation monitor (pulse oximeter) at home?" If Yes, "What is your reading (oxygen level) today?" "What is your usual oxygen  saturation reading?" (e.g., 95%)       No 11. PREGNANCY: "Is there any chance you are pregnant?" "When was your last menstrual period?"       NA 12. TRAVEL: "Have you traveled out of the country in the last month?" (e.g., travel history, exposures)       NA  Protocols used: Breathing Difficulty-A-AH

## 2021-04-20 NOTE — Progress Notes (Signed)
Date:  04/20/2021   Name:  Eric [name]   DOB:  10-29-1923   MRN:  [id number]   Chief Complaint: Shortness of Breath (X2- 3 days, when walking and sitting. Light headed) and Urinary Tract Infection (X1 weeks, Burning when urinating/)  Shortness of Breath This is a new problem. Episode onset: about 2 days. The problem occurs intermittently. The problem has been waxing and waning. Pertinent negatives include no abdominal pain, chest pain, leg swelling, orthopnea or PND.  Urinary Tract Infection  Associated symptoms include frequency and urgency.   Lab Results  Component Value Date   NA 141 12/10/2020   K 4.5 12/10/2020   CO2 23 12/10/2020   GLUCOSE 95 12/10/2020   BUN 23 12/10/2020   CREATININE 1.13 12/10/2020   CALCIUM 8.8 12/10/2020   EGFR 60 12/10/2020   GFRNONAA 55 (L) 09/03/2019   Lab Results  Component Value Date   CHOL 185 12/10/2020   HDL 83 12/10/2020   LDLCALC 91 12/10/2020   TRIG 56 12/10/2020   CHOLHDL 2.5 08/24/2017   No results found for: TSH No results found for: HGBA1C Lab Results  Component Value Date   WBC 3.8 (L) 12/20/2018   HGB 11.4 (L) 12/20/2018   HCT 35.1 (L) 12/20/2018   MCV 91.2 12/20/2018   PLT 197 12/20/2018   Lab Results  Component Value Date   ALT 11 12/10/2020   AST 15 12/10/2020   ALKPHOS 47 12/10/2020   BILITOT 0.3 12/10/2020   No components found for: VITD  Review of Systems  Respiratory:  Positive for shortness of breath.   Cardiovascular:  Negative for chest pain, palpitations, orthopnea, leg swelling and PND.  Gastrointestinal:  Negative for abdominal pain.  Genitourinary:  Positive for dysuria, frequency and urgency.   Patient Active Problem List   Diagnosis Date Noted   Essential hypertension 10/22/2015   Hyperlipidemia 10/22/2015   Benign prostatic hyperplasia 10/22/2015    No Known Allergies  Past Surgical History:  Procedure Laterality Date   EYE SURGERY     cataract extraction   HERNIA REPAIR Left  2014   inguinal hernia   PACEMAKER INSERTION Left 12/27/2017   Procedure: INSERTION PACEMAKER-INITIAL DUAL CHAMBER;  Surgeon: Isaias Cowman, MD;  Location: [facility] OR[facility];  Service: Cardiovascular;  Laterality: Left;    Social History   Tobacco Use   Smoking status: Never   Smokeless tobacco: Never  Vaping Use   Vaping Use: Never used  Substance Use Topics   Alcohol use: No   Drug use: No     Medication list has been reviewed and updated.  Current Meds  Medication Sig   acetaminophen (TYLENOL) 325 MG tablet Take 650 mg by mouth every 6 (six) hours as needed.   amLODipine (NORVASC) 5 MG tablet    brimonidine (ALPHAGAN) 0.2 % ophthalmic solution    fluticasone (FLONASE) 50 MCG/ACT nasal spray USE 1 SPRAY IN EACH NOSTRIL EVERY DAY   latanoprost (XALATAN) 0.005 % ophthalmic solution Place 1 drop into both eyes at bedtime.   losartan (COZAAR) 100 MG tablet Take 1 tablet (100 mg total) by mouth daily.   omeprazole (PRILOSEC) 20 MG capsule Take 1 capsule (20 mg total) by mouth daily.   simvastatin (ZOCOR) 40 MG tablet Take 1 tablet (40 mg total) by mouth at bedtime.   tamsulosin (FLOMAX) 0.4 MG CAPS capsule Take 1 capsule (0.4 mg total) by mouth daily.   timolol (TIMOPTIC) 0.5 % ophthalmic solution SMARTSIG:In Eye(s)  PHQ 2/9 Scores 04/20/2021 12/10/2020 10/16/2020 02/04/2020  PHQ - 2 Score 1 2 0 0  PHQ- 9 Score 3 5 0 -    GAD 7 : Generalized Anxiety Score 04/20/2021 12/10/2020 10/16/2020 12/03/2019  Nervous, Anxious, on Edge 0 0 0 0  Control/stop worrying 0 0 0 0  Worry too much - different things 0 0 0 0  Trouble relaxing 0 0 0 0  Restless 0 0 0 0  Easily annoyed or irritable 0 0 0 0  Afraid - awful might happen 0 0 0 0  Total GAD 7 Score 0 0 0 0    BP Readings from Last 3 Encounters:  04/20/21 112/64  03/16/21 130/80  12/10/20 130/78    Physical Exam Vitals and nursing note reviewed.  HENT:     Mouth/Throat:     Mouth: Mucous membranes are moist.     Pharynx:  Oropharynx is clear.  Neck:     Thyroid: No thyromegaly.     Vascular: No hepatojugular reflux or JVD.  Musculoskeletal:     Cervical back: Normal range of motion and neck supple.    Wt Readings from Last 3 Encounters:  04/20/21 144 lb (65.3 kg)  03/16/21 144 lb (65.3 kg)  12/10/20 142 lb (64.4 kg)    BP 112/64   Pulse 61   Ht 5' 10"  (1.778 m)   Wt 144 lb (65.3 kg)   SpO2 98%   BMI 20.66 kg/m   Assessment and Plan:  1. UTI symptoms New onset.  Persistent.  Uncomplicated.  Patient on evaluation of urinalysis notes leukocytes.  There is no CVA tenderness nor suprapubic tenderness.  Patient does have symptoms of dysuria urgency and frequency consistent with urethritis and we will treat with doxycycline 100 mg twice a day. - POCT urinalysis dipstick  2. Chronic systolic heart failure (HCC) Chronic.  Persistent.  Stable.  Patient is not experiencing any dyspnea on exertion at this time.  There is no weight gain over the holiday.  To suggest volume overload.  No JVD is noted nor has there been any hepatojugular reflux.  Patient does not relate any PND, orthopnea, or palpitations.  Patient will continue medications as prescribed and if this should worsen her neck step would be to refer back to Dr. [name] for further evaluation or if it is of an urgent nature patient is to go to the hospital.  In the meantime patient will continue losartan and amlodipine as prescribed.

## 2021-04-20 NOTE — Progress Notes (Signed)
    Date:  04/20/2021   Name:  Eric [name]   DOB:  06/17/1923   MRN:  [id number]   Chief Complaint: Shortness of Breath and Urinary Tract Infection (Burning when urinating /)  Shortness of Breath   Lab Results  Component Value Date   NA 141 12/10/2020   K 4.5 12/10/2020   CO2 23 12/10/2020   GLUCOSE 95 12/10/2020   BUN 23 12/10/2020   CREATININE 1.13 12/10/2020   CALCIUM 8.8 12/10/2020   EGFR 60 12/10/2020   GFRNONAA 55 (L) 09/03/2019   Lab Results  Component Value Date   CHOL 185 12/10/2020   HDL 83 12/10/2020   LDLCALC 91 12/10/2020   TRIG 56 12/10/2020   CHOLHDL 2.5 08/24/2017   No results found for: TSH No results found for: HGBA1C Lab Results  Component Value Date   WBC 3.8 (L) 12/20/2018   HGB 11.4 (L) 12/20/2018   HCT 35.1 (L) 12/20/2018   MCV 91.2 12/20/2018   PLT 197 12/20/2018   Lab Results  Component Value Date   ALT 11 12/10/2020   AST 15 12/10/2020   ALKPHOS 47 12/10/2020   BILITOT 0.3 12/10/2020   No components found for: VITD  Review of Systems  Respiratory:  Positive for shortness of breath.    Patient Active Problem List   Diagnosis Date Noted   Essential hypertension 10/22/2015   Hyperlipidemia 10/22/2015   Benign prostatic hyperplasia 10/22/2015    No Known Allergies  Past Surgical History:  Procedure Laterality Date   EYE SURGERY     cataract extraction   HERNIA REPAIR Left 2014   inguinal hernia   PACEMAKER INSERTION Left 12/27/2017   Procedure: INSERTION PACEMAKER-INITIAL DUAL CHAMBER;  Surgeon: Isaias Cowman, MD;  Location: [facility] OR[facility];  Service: Cardiovascular;  Laterality: Left;    Social History   Tobacco Use   Smoking status: Never   Smokeless tobacco: Never  Vaping Use   Vaping Use: Never used  Substance Use Topics   Alcohol use: No   Drug use: No     Medication list has been reviewed and updated.  No outpatient medications have been marked as taking for the 04/20/21 encounter (Office Visit) with  Juline Patch, MD.    [facility] 2/9 Scores 12/10/2020 10/16/2020 02/04/2020 12/03/2019  PHQ - 2 Score 2 0 0 0  PHQ- 9 Score 5 0 - 0    GAD 7 : Generalized Anxiety Score 12/10/2020 10/16/2020 12/03/2019 09/03/2019  Nervous, Anxious, on Edge 0 0 0 0  Control/stop worrying 0 0 0 0  Worry too much - different things 0 0 0 0  Trouble relaxing 0 0 0 0  Restless 0 0 0 0  Easily annoyed or irritable 0 0 0 0  Afraid - awful might happen 0 0 0 0  Total GAD 7 Score 0 0 0 0    BP Readings from Last 3 Encounters:  03/16/21 130/80  12/10/20 130/78  12/01/20 (!) 209/78    Physical Exam  Wt Readings from Last 3 Encounters:  03/16/21 144 lb (65.3 kg)  12/10/20 142 lb (64.4 kg)  10/16/20 149 lb (67.6 kg)    Ht $R'5\' 10"'nA$  (1.778 m)   BMI 20.66 kg/m   Assessment and Plan:

## 2021-04-20 NOTE — Telephone Encounter (Signed)
Noted pt has an appt for today 11/28  KP

## 2021-04-27 ENCOUNTER — Telehealth: Payer: Self-pay

## 2021-04-27 NOTE — Telephone Encounter (Signed)
Copied from [place] [id number]. Topic: General - Other >> Apr 27, 2021 12:59 PM Alanda Slim E wrote: Reason for CRM: Pts daughter Scot Dock is faxiing over POA paperwork and also wanted to speak with Dr. Ronnald Ramp about her dad needing home health since he will now be at home alone / please advise

## 2021-04-29 ENCOUNTER — Other Ambulatory Visit: Payer: Self-pay | Admitting: Family Medicine

## 2021-04-29 DIAGNOSIS — N401 Enlarged prostate with lower urinary tract symptoms: Secondary | ICD-10-CM

## 2021-04-29 DIAGNOSIS — K219 Gastro-esophageal reflux disease without esophagitis: Secondary | ICD-10-CM

## 2021-04-29 NOTE — Telephone Encounter (Signed)
Requested Prescriptions  Pending Prescriptions Disp Refills  . tamsulosin (FLOMAX) 0.4 MG CAPS capsule [Pharmacy Med Name: TAMSULOSIN HYDROCHLORIDE 0.4 MG Capsule] 90 capsule 1    Sig: TAKE [place]     Urology: Alpha-Adrenergic Blocker Passed - 04/29/2021  8:10 PM      Passed - Last BP in normal range    BP Readings from Last 1 Encounters:  04/20/21 112/64         Passed - Valid encounter within last 12 months    Recent Outpatient Visits          1 week ago UTI symptoms   [facility] Juline Patch, MD   1 month ago Eczema of lower leg   [facility] Juline Patch, MD   4 months ago Seasonal allergic rhinitis due to pollen   [facility] Juline Patch, MD   6 months ago Impacted cerumen of both ears   [facility] Juline Patch, MD   1 year ago Essential hypertension   [place], Deanna C, MD      Future Appointments            In 1 week Juline Patch, MD [facility], [place]   In 1 month Juline Patch, MD [facility], [place]           . omeprazole (PRILOSEC) 20 MG capsule [Pharmacy Med Name: OMEPRAZOLE 20 MG Capsule Delayed Release] 90 capsule 1    Sig: TAKE 1 CAPSULE EVERY DAY     Gastroenterology: Proton Pump Inhibitors Passed - 04/29/2021  8:10 PM      Passed - Valid encounter within last 12 months    Recent Outpatient Visits          1 week ago UTI symptoms   [place], MD   1 month ago Eczema of lower leg   [facility] Juline Patch, MD   4 months ago Seasonal allergic rhinitis due to pollen   [place], MD   6 months ago Impacted cerumen of both ears   [facility] Juline Patch, MD   1 year ago Essential hypertension   [place], Deanna C, MD      Future Appointments            In 1 week Juline Patch, MD [facility], [place]   In 1 month Juline Patch, MD  [facility], [facility]

## 2021-05-06 ENCOUNTER — Other Ambulatory Visit: Payer: Self-pay | Admitting: Family Medicine

## 2021-05-06 DIAGNOSIS — E785 Hyperlipidemia, unspecified: Secondary | ICD-10-CM

## 2021-05-06 NOTE — Telephone Encounter (Signed)
Requested Prescriptions  Pending Prescriptions Disp Refills   simvastatin (ZOCOR) 40 MG tablet [Pharmacy Med Name: SIMVASTATIN 40 MG Tablet] 90 tablet 1    Sig: TAKE 1 TABLET AT BEDTIME     Cardiovascular:  Antilipid - Statins Passed - 05/06/2021  1:06 AM      Passed - Total Cholesterol in normal range and within 360 days    Cholesterol, Total  Date Value Ref Range Status  12/10/2020 185 100 - 199 mg/dL Final         Passed - LDL in normal range and within 360 days    LDL Chol Calc (NIH)  Date Value Ref Range Status  12/10/2020 91 0 - 99 mg/dL Final         Passed - HDL in normal range and within 360 days    HDL  Date Value Ref Range Status  12/10/2020 83 >39 mg/dL Final         Passed - Triglycerides in normal range and within 360 days    Triglycerides  Date Value Ref Range Status  12/10/2020 56 0 - 149 mg/dL Final         Passed - Patient is not pregnant      Passed - Valid encounter within last 12 months    Recent Outpatient Visits          2 weeks ago UTI symptoms   [place], [place]   1 month ago Eczema of lower leg   [facility] Juline Patch, MD   4 months ago Seasonal allergic rhinitis due to pollen   [facility] Juline Patch, [facility]   6 months ago Impacted cerumen of both ears   [facility] Juline Patch, MD   1 year ago Essential hypertension   [place], [place], MD      Future Appointments            Tomorrow Juline Patch, MD [facility], [place]   In 1 month Juline Patch, MD [facility], [facility]

## 2021-05-07 ENCOUNTER — Ambulatory Visit: Payer: Medicare HMO | Admitting: Family Medicine

## 2021-05-20 ENCOUNTER — Other Ambulatory Visit: Payer: Self-pay | Admitting: Internal Medicine

## 2021-05-20 NOTE — Telephone Encounter (Signed)
Requested Prescriptions  Pending Prescriptions Disp Refills   losartan (COZAAR) 100 MG tablet [Pharmacy Med Name: losartan 100 mg tablet] 90 tablet 1    Sig: TAKE ONE TABLET BY MOUTH ONCE DAILY     Cardiovascular:  Angiotensin Receptor Blockers Passed - 05/20/2021  5:44 PM      Passed - Cr in normal range and within 180 days    Creatinine  Date Value Ref Range Status  11/06/2013 0.78 0.60 - 1.30 mg/dL Final   Creatinine, Ser  Date Value Ref Range Status  12/10/2020 1.13 0.76 - 1.27 mg/dL Final         Passed - K in normal range and within 180 days    Potassium  Date Value Ref Range Status  12/10/2020 4.5 3.5 - 5.2 mmol/L Final  11/06/2013 4.1 3.5 - 5.1 mmol/L Final         Passed - Patient is not pregnant      Passed - Last BP in normal range    BP Readings from Last 1 Encounters:  04/20/21 112/64         Passed - Valid encounter within last 6 months    Recent Outpatient Visits          1 month ago UTI symptoms   [facility] Juline Patch, MD   2 months ago Eczema of lower leg   [facility] Juline Patch, MD   5 months ago Seasonal allergic rhinitis due to pollen   [facility] Juline Patch, MD   7 months ago Impacted cerumen of both ears   [facility] Juline Patch, MD   1 year ago Essential hypertension   [place], Deanna C, MD      Future Appointments            In 1 week Juline Patch, MD [facility], [facility]

## 2021-06-01 ENCOUNTER — Other Ambulatory Visit: Payer: Self-pay

## 2021-06-01 ENCOUNTER — Ambulatory Visit (INDEPENDENT_AMBULATORY_CARE_PROVIDER_SITE_OTHER): Payer: Medicare HMO | Admitting: Family Medicine

## 2021-06-01 ENCOUNTER — Encounter: Payer: Self-pay | Admitting: Family Medicine

## 2021-06-01 VITALS — BP 134/74 | HR 80 | Ht 70.0 in | Wt 147.0 lb

## 2021-06-01 DIAGNOSIS — K219 Gastro-esophageal reflux disease without esophagitis: Secondary | ICD-10-CM

## 2021-06-01 DIAGNOSIS — N401 Enlarged prostate with lower urinary tract symptoms: Secondary | ICD-10-CM

## 2021-06-01 DIAGNOSIS — E785 Hyperlipidemia, unspecified: Secondary | ICD-10-CM

## 2021-06-01 DIAGNOSIS — I10 Essential (primary) hypertension: Secondary | ICD-10-CM

## 2021-06-01 MED ORDER — SIMVASTATIN 40 MG PO TABS: 40.0000 mg | ORAL_TABLET | Freq: Every day | ORAL | 1 refills | Status: DC

## 2021-06-01 MED ORDER — TAMSULOSIN HCL 0.4 MG PO CAPS: 0.4000 mg | ORAL_CAPSULE | Freq: Every day | ORAL | 1 refills | Status: DC

## 2021-06-01 MED ORDER — LOSARTAN POTASSIUM 100 MG PO TABS: 100.0000 mg | ORAL_TABLET | Freq: Every day | ORAL | 1 refills | Status: DC

## 2021-06-01 MED ORDER — OMEPRAZOLE 20 MG PO CPDR: 20.0000 mg | DELAYED_RELEASE_CAPSULE | Freq: Every day | ORAL | 1 refills | Status: DC

## 2021-06-01 NOTE — Progress Notes (Signed)
Date:  06/01/2021   Name:  Eric [name]   DOB:  1924-02-06   MRN:  [id number]   Chief Complaint: Benign Prostatic Hypertrophy, Hypertension, Gastroesophageal Reflux, and Hyperlipidemia  Benign Prostatic Hypertrophy This is a chronic problem. The current episode started more than 1 year ago. The problem has been gradually improving since onset. Irritative symptoms do not include frequency, nocturia or urgency. Obstructive symptoms do not include dribbling, incomplete emptying, an intermittent stream, a slower stream, straining or a weak stream. Pertinent negatives include no chills, dysuria, genital pain, hematuria, hesitancy, nausea or vomiting. The treatment provided mild relief.  Hypertension This is a chronic problem. The current episode started more than 1 year ago. The problem has been gradually improving since onset. The problem is controlled. Pertinent negatives include no anxiety, blurred vision, chest pain, headaches, malaise/fatigue, neck pain, orthopnea, palpitations, peripheral edema, PND, shortness of breath or sweats. There are no associated agents to hypertension. There are no known risk factors for coronary artery disease.  Gastroesophageal Reflux He reports no abdominal pain, no chest pain, no coughing, no dysphagia, no nausea, no sore throat or no wheezing. This is a chronic problem. The problem has been gradually improving. He has tried a PPI for the symptoms. The treatment provided moderate relief.  Hyperlipidemia This is a chronic problem. The current episode started more than 1 year ago. The problem is controlled. Recent lipid tests were reviewed and are normal. Pertinent negatives include no chest pain, myalgias or shortness of breath. Current antihyperlipidemic treatment includes statins. The current treatment provides moderate improvement of lipids. There are no compliance problems.  Risk factors for coronary artery disease include dyslipidemia.   Lab Results  Component  Value Date   NA 141 12/10/2020   K 4.5 12/10/2020   CO2 23 12/10/2020   GLUCOSE 95 12/10/2020   BUN 23 12/10/2020   CREATININE 1.13 12/10/2020   CALCIUM 8.8 12/10/2020   EGFR 60 12/10/2020   GFRNONAA 55 (L) 09/03/2019   Lab Results  Component Value Date   CHOL 185 12/10/2020   HDL 83 12/10/2020   LDLCALC 91 12/10/2020   TRIG 56 12/10/2020   CHOLHDL 2.5 08/24/2017   No results found for: TSH No results found for: HGBA1C Lab Results  Component Value Date   WBC 3.8 (L) 12/20/2018   HGB 11.4 (L) 12/20/2018   HCT 35.1 (L) 12/20/2018   MCV 91.2 12/20/2018   PLT 197 12/20/2018   Lab Results  Component Value Date   ALT 11 12/10/2020   AST 15 12/10/2020   ALKPHOS 47 12/10/2020   BILITOT 0.3 12/10/2020   No results found for: 25OHVITD2, 25OHVITD3, VD25OH   Review of Systems  Constitutional:  Negative for chills, fever and malaise/fatigue.  HENT:  Negative for drooling, ear discharge, ear pain and sore throat.   Eyes:  Negative for blurred vision.  Respiratory:  Negative for cough, shortness of breath and wheezing.   Cardiovascular:  Negative for chest pain, palpitations, orthopnea, leg swelling and PND.  Gastrointestinal:  Negative for abdominal pain, blood in stool, constipation, diarrhea, dysphagia, nausea and vomiting.  Endocrine: Negative for polydipsia.  Genitourinary:  Negative for dysuria, frequency, hematuria, hesitancy, incomplete emptying, nocturia and urgency.  Musculoskeletal:  Negative for back pain, myalgias and neck pain.  Skin:  Negative for rash.  Allergic/Immunologic: Negative for environmental allergies.  Neurological:  Negative for dizziness and headaches.  Hematological:  Does not bruise/bleed easily.  Psychiatric/Behavioral:  Negative for suicidal ideas. The patient  is not nervous/anxious.    Patient Active Problem List   Diagnosis Date Noted   Essential hypertension 10/22/2015   Hyperlipidemia 10/22/2015   Benign prostatic hyperplasia 10/22/2015     No Known Allergies  Past Surgical History:  Procedure Laterality Date   EYE SURGERY     cataract extraction   HERNIA REPAIR Left 2014   inguinal hernia   PACEMAKER INSERTION Left 12/27/2017   Procedure: INSERTION PACEMAKER-INITIAL DUAL CHAMBER;  Surgeon: Isaias Cowman, MD;  Location: [facility] ORS;  Service: Cardiovascular;  Laterality: Left;    Social History   Tobacco Use   Smoking status: Never   Smokeless tobacco: Never  Vaping Use   Vaping Use: Never used  Substance Use Topics   Alcohol use: No   Drug use: No     Medication list has been reviewed and updated.  Current Meds  Medication Sig   acetaminophen (TYLENOL) 325 MG tablet Take 650 mg by mouth every 6 (six) hours as needed.   amLODipine (NORVASC) 5 MG tablet    brimonidine (ALPHAGAN) 0.2 % ophthalmic solution    fluticasone (FLONASE) 50 MCG/ACT nasal spray USE 1 SPRAY IN EACH NOSTRIL EVERY DAY   latanoprost (XALATAN) 0.005 % ophthalmic solution Place 1 drop into both eyes at bedtime.   losartan (COZAAR) 100 MG tablet TAKE ONE TABLET BY MOUTH ONCE DAILY   omeprazole (PRILOSEC) 20 MG capsule TAKE 1 CAPSULE EVERY DAY   simvastatin (ZOCOR) 40 MG tablet TAKE 1 TABLET AT BEDTIME   tamsulosin (FLOMAX) 0.4 MG CAPS capsule TAKE 1 CAPSULE EVERY DAY   timolol (TIMOPTIC) 0.5 % ophthalmic solution SMARTSIG:In Eye(s)    PHQ 2/9 Scores 06/01/2021 04/20/2021 12/10/2020 10/16/2020  PHQ - 2 Score 0 1 2 0  PHQ- 9 Score 0 3 5 0    GAD 7 : Generalized Anxiety Score 06/01/2021 04/20/2021 12/10/2020 10/16/2020  Nervous, Anxious, on Edge 0 0 0 0  Control/stop worrying 0 0 0 0  Worry too much - different things 0 0 0 0  Trouble relaxing 0 0 0 0  Restless 0 0 0 0  Easily annoyed or irritable 0 0 0 0  Afraid - awful might happen 0 0 0 0  Total GAD 7 Score 0 0 0 0  Anxiety Difficulty Not difficult at all - - -    BP Readings from Last 3 Encounters:  06/01/21 (!) 158/74  04/20/21 112/64  03/16/21 130/80    Physical  Exam Vitals and nursing note reviewed.  HENT:     Head: Normocephalic.     Right Ear: External ear normal.     Left Ear: External ear normal.     Nose: Nose normal.  Eyes:     General: No scleral icterus.       Right eye: No discharge.        Left eye: No discharge.     Conjunctiva/sclera: Conjunctivae normal.     Pupils: Pupils are equal, round, and reactive to light.  Neck:     Thyroid: No thyromegaly.     Vascular: No JVD.     Trachea: No tracheal deviation.  Cardiovascular:     Rate and Rhythm: Normal rate and regular rhythm.     Heart sounds: Normal heart sounds. No murmur heard.   No friction rub. No gallop.  Pulmonary:     Effort: No respiratory distress.     Breath sounds: Normal breath sounds. No wheezing, rhonchi or rales.  Abdominal:     General:  Bowel sounds are normal.     Palpations: Abdomen is soft. There is no mass.     Tenderness: There is no abdominal tenderness. There is no guarding or rebound.  Musculoskeletal:        General: No tenderness. Normal range of motion.     Cervical back: Normal range of motion and neck supple.  Lymphadenopathy:     Cervical: No cervical adenopathy.  Skin:    General: Skin is warm.     Findings: No rash.  Neurological:     Mental Status: He is alert and oriented to person, place, and time.     Cranial Nerves: No cranial nerve deficit.     Deep Tendon Reflexes: Reflexes are normal and symmetric.    Wt Readings from Last 3 Encounters:  06/01/21 147 lb (66.7 kg)  04/20/21 144 lb (65.3 kg)  03/16/21 144 lb (65.3 kg)    BP (!) 158/74    Pulse 80    Ht 5' 10"  (1.778 m)    Wt 147 lb (66.7 kg)    BMI 21.09 kg/m   Assessment and Plan:  1. Essential hypertension Chronic.  Controlled.  Stable.  Blood pressure measured at 134/74.  Continue losartan 100 mg once a day. - losartan (COZAAR) 100 MG tablet; Take 1 tablet (100 mg total) by mouth daily.  Dispense: 90 tablet; Refill: 1 - Renal Function Panel  2. Gastroesophageal  reflux disease Chronic.  Controlled.  Stable.  Continue omeprazole 20 mg once a day. - omeprazole (PRILOSEC) 20 MG capsule; Take 1 capsule (20 mg total) by mouth daily.  Dispense: 90 capsule; Refill: 1  3. Hyperlipidemia, unspecified hyperlipidemia type Chronic.  Controlled.  Stable.  Review of previous lipid panel from July is acceptable and within normal range.  We will continue simvastatin 40 mg once a day. - simvastatin (ZOCOR) 40 MG tablet; Take 1 tablet (40 mg total) by mouth at bedtime.  Dispense: 90 tablet; Refill: 1  4. Benign prostatic hyperplasia with lower urinary tract symptoms, symptom details unspecified Chronic.  Controlled.  Stable.  Patient is asymptomatic including no difficulty with emptying bladder and no change in stream's capacity.  Continuance of tamsulosin 0.4 mg once a day at this time and will recheck in 6 months - tamsulosin (FLOMAX) 0.4 MG CAPS capsule; Take 1 capsule (0.4 mg total) by mouth daily.  Dispense: 90 capsule; Refill: 1

## 2021-06-02 LAB — RENAL FUNCTION PANEL
Albumin: 4.1 g/dL (ref 3.5–4.6)
BUN/Creatinine Ratio: 19 (ref 10–24)
BUN: 21 mg/dL (ref 10–36)
CO2: 23 mmol/L (ref 20–29)
Calcium: 8.5 mg/dL — ABNORMAL LOW (ref 8.6–10.2)
Chloride: 104 mmol/L (ref 96–106)
Creatinine, Ser: 1.08 mg/dL (ref 0.76–1.27)
Glucose: 101 mg/dL — ABNORMAL HIGH (ref 70–99)
Phosphorus: 3.7 mg/dL (ref 2.8–4.1)
Potassium: 4.3 mmol/L (ref 3.5–5.2)
Sodium: 143 mmol/L (ref 134–144)
eGFR: 63 mL/min/{1.73_m2} (ref >59)

## 2021-06-12 ENCOUNTER — Ambulatory Visit: Payer: Self-pay | Admitting: Family Medicine

## 2021-07-01 ENCOUNTER — Ambulatory Visit (INDEPENDENT_AMBULATORY_CARE_PROVIDER_SITE_OTHER): Payer: Medicare HMO

## 2021-07-01 DIAGNOSIS — Z0000 Encounter for general adult medical examination without abnormal findings: Secondary | ICD-10-CM

## 2021-07-01 NOTE — Patient Instructions (Signed)
Eric [name] , Thank you for taking time to come for your Medicare Wellness Visit. I appreciate your ongoing commitment to your health goals. Please review the following plan we discussed and let me know if I can assist you in the future.   Screening recommendations/referrals: Colonoscopy: no longer required Recommended yearly ophthalmology/optometry visit for glaucoma screening and checkup Recommended yearly dental visit for hygiene and checkup  Vaccinations: Influenza vaccine: done 03/16/21 Pneumococcal vaccine: done 03/21/17 Tdap vaccine: due Shingles vaccine: Shingrix discussed. Please contact your pharmacy for coverage information.  Covid-19:  done 07/14/19 & 08/07/19  Conditions/risks identified: Recommend continuing fall prevention at home  Next appointment: Follow up in one year for your annual wellness visit.   Preventive Care 20 Years and Older, Male Preventive care refers to lifestyle choices and visits with your health care provider that can promote health and wellness. What does preventive care include? A yearly physical exam. This is also called an annual well check. Dental exams once or twice a year. Routine eye exams. Ask your health care provider how often you should have your eyes checked. Personal lifestyle choices, including: Daily care of your teeth and gums. Regular physical activity. Eating a healthy diet. Avoiding tobacco and drug use. Limiting alcohol use. Practicing safe sex. Taking low doses of aspirin every day. Taking vitamin and mineral supplements as recommended by your health care provider. What happens during an annual well check? The services and screenings done by your health care provider during your annual well check will depend on your age, overall health, lifestyle risk factors, and family history of disease. Counseling  Your health care provider may ask you questions about your: Alcohol use. Tobacco use. Drug use. Emotional  well-being. Home and relationship well-being. Sexual activity. Eating habits. History of falls. Memory and ability to understand (cognition). Work and work Statistician. Screening  You may have the following tests or measurements: Height, weight, and BMI. Blood pressure. Lipid and cholesterol levels. These may be checked every 5 years, or more frequently if you are over 32 years old. Skin check. Lung cancer screening. You may have this screening every year starting at age 36 if you have a 30-pack-year history of smoking and currently smoke or have quit within the past 15 years. Fecal occult blood test (FOBT) of the stool. You may have this test every year starting at age 10. Flexible sigmoidoscopy or colonoscopy. You may have a sigmoidoscopy every 5 years or a colonoscopy every 10 years starting at age 61. Prostate cancer screening. Recommendations will vary depending on your family history and other risks. Hepatitis C blood test. Hepatitis B blood test. Sexually transmitted disease (STD) testing. Diabetes screening. This is done by checking your blood sugar (glucose) after you have not eaten for a while (fasting). You may have this done every 1-3 years. Abdominal aortic aneurysm (AAA) screening. You may need this if you are a current or former smoker. Osteoporosis. You may be screened starting at age 56 if you are at high risk. Talk with your health care provider about your test results, treatment options, and if necessary, the need for more tests. Vaccines  Your health care provider may recommend certain vaccines, such as: Influenza vaccine. This is recommended every year. Tetanus, diphtheria, and acellular pertussis (Tdap, Td) vaccine. You may need a Td booster every 10 years. Zoster vaccine. You may need this after age 67. Pneumococcal 13-valent conjugate (PCV13) vaccine. One dose is recommended after age 48. Pneumococcal polysaccharide (PPSV23) vaccine. One dose is  recommended after  age 74. Talk to your health care provider about which screenings and vaccines you need and how often you need them. This information is not intended to replace advice given to you by your health care provider. Make sure you discuss any questions you have with your health care provider. Document Released: 06/06/2015 Document Revised: 01/28/2016 Document Reviewed: 03/11/2015 Elsevier Interactive Patient Education  2017 [place] Prevention in the Home Falls can cause injuries. They can happen to people of all ages. There are many things you can do to make your home safe and to help prevent falls. What can I do on the outside of my home? Regularly fix the edges of walkways and driveways and fix any cracks. Remove anything that might make you trip as you walk through a door, such as a raised step or threshold. Trim any bushes or trees on the path to your home. Use bright outdoor lighting. Clear any walking paths of anything that might make someone trip, such as rocks or tools. Regularly check to see if handrails are loose or broken. Make sure that both sides of any steps have handrails. Any raised decks and porches should have guardrails on the edges. Have any leaves, snow, or ice cleared regularly. Use sand or salt on walking paths during winter. Clean up any spills in your garage right away. This includes oil or grease spills. What can I do in the bathroom? Use night lights. Install grab bars by the toilet and in the tub and shower. Do not use towel bars as grab bars. Use non-skid mats or decals in the tub or shower. If you need to sit down in the shower, use a plastic, non-slip stool. Keep the floor dry. Clean up any water that spills on the floor as soon as it happens. Remove soap buildup in the tub or shower regularly. Attach bath mats securely with double-sided non-slip rug tape. Do not have throw rugs and other things on the floor that can make you trip. What can I do in the  bedroom? Use night lights. Make sure that you have a light by your bed that is easy to reach. Do not use any sheets or blankets that are too big for your bed. They should not hang down onto the floor. Have a firm chair that has side arms. You can use this for support while you get dressed. Do not have throw rugs and other things on the floor that can make you trip. What can I do in the kitchen? Clean up any spills right away. Avoid walking on wet floors. Keep items that you use a lot in easy-to-reach places. If you need to reach something above you, use a strong step stool that has a grab bar. Keep electrical cords out of the way. Do not use floor polish or wax that makes floors slippery. If you must use wax, use non-skid floor wax. Do not have throw rugs and other things on the floor that can make you trip. What can I do with my stairs? Do not leave any items on the stairs. Make sure that there are handrails on both sides of the stairs and use them. Fix handrails that are broken or loose. Make sure that handrails are as long as the stairways. Check any carpeting to make sure that it is firmly attached to the stairs. Fix any carpet that is loose or worn. Avoid having throw rugs at the top or bottom of the stairs. If  you do have throw rugs, attach them to the floor with carpet tape. Make sure that you have a light switch at the top of the stairs and the bottom of the stairs. If you do not have them, ask someone to add them for you. What else can I do to help prevent falls? Wear shoes that: Do not have high heels. Have rubber bottoms. Are comfortable and fit you well. Are closed at the toe. Do not wear sandals. If you use a stepladder: Make sure that it is fully opened. Do not climb a closed stepladder. Make sure that both sides of the stepladder are locked into place. Ask someone to hold it for you, if possible. Clearly mark and make sure that you can see: Any grab bars or  handrails. First and last steps. Where the edge of each step is. Use tools that help you move around (mobility aids) if they are needed. These include: Canes. Walkers. Scooters. Crutches. Turn on the lights when you go into a dark area. Replace any light bulbs as soon as they burn out. Set up your furniture so you have a clear path. Avoid moving your furniture around. If any of your floors are uneven, fix them. If there are any pets around you, be aware of where they are. Review your medicines with your doctor. Some medicines can make you feel dizzy. This can increase your chance of falling. Ask your doctor what other things that you can do to help prevent falls. This information is not intended to replace advice given to you by your health care provider. Make sure you discuss any questions you have with your health care provider. Document Released: 03/06/2009 Document Revised: 10/16/2015 Document Reviewed: 06/14/2014 Elsevier Interactive Patient Education  2017 [employer].

## 2021-07-01 NOTE — Progress Notes (Signed)
Subjective:   [address] Treveon Bourcier is a 88 y.o. male who presents for Medicare Annual/Subsequent preventive examination.  Virtual Visit via Telephone Note  I connected with  [employer] on 07/01/21 at  8:00 AM EST by telephone and verified that I am speaking with the correct person using two identifiers.  Location: Patient: home Provider: [facility] Persons participating in the virtual visit: patient & daughter [name] Luella Cook Health Advisor   I discussed the limitations, risks, security and privacy concerns of performing an evaluation and management service by telephone and the availability of in person appointments. The patient expressed understanding and agreed to proceed.  Interactive audio and video telecommunications were attempted between this nurse and patient, however failed, due to patient having technical difficulties OR patient did not have access to video capability.  We continued and completed visit with audio only.  Some vital signs may be absent or patient reported.   Clemetine Marker, LPN   Review of Systems     Cardiac Risk Factors include: advanced age (>2men, >54 women);male gender;dyslipidemia;hypertension     Objective:    There were no vitals filed for this visit. There is no height or weight on file to calculate BMI.  Advanced Directives 07/01/2021 02/04/2020 12/20/2018 12/27/2017 12/20/2017 06/18/2016 09/16/2015  Does Patient Have a Medical Advance Directive? Yes Yes Yes Yes Yes No No  Type of Paramedic of [place];Living will [place];Living will Living will Healthcare Power of [place];Living will - -  Does patient want to make changes to medical advance directive? - - - No - Patient declined No - Patient declined - -  Copy of [place] in Chart? Yes - validated most recent copy scanned in chart (See row information) Yes - validated most recent copy scanned in chart (See row  information) - Yes No - copy requested - -  Would patient like information on creating a medical advance directive? - - - - - - No - patient declined information    Current Medications (verified) Outpatient Encounter Medications as of 07/01/2021  Medication Sig   acetaminophen (TYLENOL) 325 MG tablet Take 650 mg by mouth every 6 (six) hours as needed.   amLODipine (NORVASC) 5 MG tablet    brimonidine (ALPHAGAN) 0.2 % ophthalmic solution    fluticasone (FLONASE) 50 MCG/ACT nasal spray USE 1 SPRAY IN EACH NOSTRIL EVERY DAY   latanoprost (XALATAN) 0.005 % ophthalmic solution Place 1 drop into both eyes at bedtime.   losartan (COZAAR) 100 MG tablet Take 1 tablet (100 mg total) by mouth daily.   omeprazole (PRILOSEC) 20 MG capsule Take 1 capsule (20 mg total) by mouth daily.   simvastatin (ZOCOR) 40 MG tablet Take 1 tablet (40 mg total) by mouth at bedtime.   tamsulosin (FLOMAX) 0.4 MG CAPS capsule Take 1 capsule (0.4 mg total) by mouth daily.   timolol (TIMOPTIC) 0.5 % ophthalmic solution SMARTSIG:In Eye(s)   triamcinolone cream (KENALOG) 0.1 % Apply 1 application topically 2 (two) times daily.   No facility-administered encounter medications on file as of 07/01/2021.    Allergies (verified) Patient has no known allergies.   History: Past Medical History:  Diagnosis Date   Arthritis    knees   Dysrhythmia    bradycardia   GERD (gastroesophageal reflux disease)    Hard of hearing    has a hearing aid but has not worn   High cholesterol    History of kidney  stones    Hypertension    Prostate disorder    Past Surgical History:  Procedure Laterality Date   EYE SURGERY     cataract extraction   HERNIA REPAIR Left 2014   inguinal hernia   PACEMAKER INSERTION Left 12/27/2017   Procedure: INSERTION PACEMAKER-INITIAL DUAL CHAMBER;  Surgeon: Isaias Cowman, MD;  Location: [facility] ORS;  Service: Cardiovascular;  Laterality: Left;   History reviewed. No pertinent family history. Social  History   Socioeconomic History   Marital status: Married    Spouse name: Not on file   Number of children: 9   Years of education: Not on file   Highest education level: Not on file  Occupational History   Not on file  Tobacco Use   Smoking status: Never   Smokeless tobacco: Never  Vaping Use   Vaping Use: Never used  Substance and Sexual Activity   Alcohol use: No   Drug use: No   Sexual activity: Not Currently  Other Topics Concern   Not on file  Social History Narrative   Pt's daughter [name] lives with him   Social Determinants of Health   Financial Resource Strain: Low Risk    Difficulty of Paying Living Expenses: Not hard at all  Food Insecurity: No Food Insecurity   Worried About Charity fundraiser in the Last Year: Never true   Arboriculturist in the Last Year: Never true  Transportation Needs: No Transportation Needs   Lack of Transportation (Medical): No   Lack of Transportation (Non-Medical): No  Physical Activity: Inactive   Days of Exercise per Week: 0 days   Minutes of Exercise per Session: 0 min  Stress: No Stress Concern Present   Feeling of Stress : Not at all  Social Connections: Moderately Integrated   Frequency of Communication with Friends and Family: Three times a week   Frequency of Social Gatherings with Friends and Family: Twice a week   Attends Religious Services: More than 4 times per year   Active Member of [employer] or Organizations: No   Attends Music therapist: Never   Marital Status: Married    Tobacco Counseling Counseling given: Not Answered   Clinical Intake:  Pre-visit preparation completed: Yes  Pain : No/denies pain     Nutritional Risks: None Diabetes: No  How often do you need to have someone help you when you read instructions, pamphlets, or other written materials from your doctor or pharmacy?: 1 - Never    Interpreter Needed?: No  Information entered by :: Clemetine Marker LPN   Activities of  Daily Living In your present state of health, do you have any difficulty performing the following activities: 07/01/2021 04/20/2021  Hearing? Tempie Donning  Vision? N Y  Difficulty concentrating or making decisions? N N  Walking or climbing stairs? N N  Dressing or bathing? N N  Doing errands, shopping? Tempie Donning  Preparing Food and eating ? N -  Using the Toilet? N -  In the past six months, have you accidently leaked urine? N -  Do you have problems with loss of bowel control? N -  Managing your Medications? N -  Managing your Finances? Y -  Housekeeping or managing your Housekeeping? N -  Some recent data might be hidden    Patient Care Team: Juline Patch, MD as PCP - General (Family Medicine)  Indicate any recent Medical Services you may have received from other than [facility] providers in the  past year (date may be approximate).     Assessment:   This is a routine wellness examination for [facility].  Hearing/Vision screen Hearing Screening - Comments:: Pt wears hearing aids Vision Screening - Comments:: Annual vision screenings done at [place] issues and exercise activities discussed: Current Exercise Habits: The patient does not participate in regular exercise at present, Exercise limited by: None identified   Goals Addressed   None    Depression Screen PHQ 2/9 Scores 07/01/2021 06/01/2021 04/20/2021 12/10/2020 10/16/2020 02/04/2020 12/03/2019  PHQ - 2 Score 0 0 1 2 0 0 0  PHQ- 9 Score 0 0 3 5 0 - 0    Fall Risk Fall Risk  07/01/2021 04/20/2021 10/16/2020 02/04/2020 12/03/2019  Falls in the past year? 0 1 0 0 0  Number falls in past yr: 0 1 - 0 -  Injury with Fall? 0 0 - 0 -  Risk for fall due to : No Fall Risks History of fall(s) - No Fall Risks -  Follow up Falls prevention discussed Falls evaluation completed Falls evaluation completed Falls prevention discussed Falls evaluation completed    [place]:  Any stairs in or around the home?  Yes  If so, are there any without handrails? No  Home free of loose throw rugs in walkways, pet beds, electrical cords, etc? Yes  Adequate lighting in your home to reduce risk of falls? Yes   ASSISTIVE DEVICES UTILIZED TO PREVENT FALLS:  Life alert? No  Use of a cane, walker or w/c? No  Grab bars in the bathroom? No  Shower chair or bench in shower? No  Elevated toilet seat or a handicapped toilet? Yes   TIMED UP AND GO:  Was the test performed? No . Telephonic visit.   Cognitive Function: Normal cognitive status assessed by direct observation by this Nurse Health Advisor. No abnormalities found. Pt unable to complete 6 CIT during telephonic visit due to hearing difficulty         Immunizations Immunization History  Administered Date(s) Administered   Fluad Quad(high Dose 65+) 02/07/2019, 03/16/2021   Influenza, High Dose Seasonal PF 03/21/2017, 06/20/2018   PFIZER(Purple Top)SARS-COV-2 Vaccination 07/14/2019, 08/07/2019   Pneumococcal Conjugate-13 03/21/2017    TDAP status: Due, Education has been provided regarding the importance of this vaccine. Advised may receive this vaccine at local pharmacy or Health Dept. Aware to provide a copy of the vaccination record if obtained from local pharmacy or Health Dept. Verbalized acceptance and understanding.  Flu Vaccine status: Up to date  Pneumococcal vaccine status: Up to date  Covid-19 vaccine status: Completed vaccines  Qualifies for Shingles Vaccine? Yes   Zostavax completed No   Shingrix Completed?: No.    Education has been provided regarding the importance of this vaccine. Patient has been advised to call insurance company to determine out of pocket expense if they have not yet received this vaccine. Advised may also receive vaccine at local pharmacy or Health Dept. Verbalized acceptance and understanding.  Screening Tests Health Maintenance  Topic Date Due   Zoster Vaccines- Shingrix (1 of 2) Never done   COVID-19  Vaccine (3 - Booster for Pfizer series) 10/02/2019   Pneumonia Vaccine 66+ Years old (2 - PPSV23 if available, else PCV20) 03/16/2022 (Originally 03/21/2018)   TETANUS/TDAP  03/16/2022 (Originally 10/04/1942)   INFLUENZA VACCINE  Completed   HPV VACCINES  Aged Out    Health Maintenance  Health Maintenance Due  Topic  Date Due   Zoster Vaccines- Shingrix (1 of 2) Never done   COVID-19 Vaccine (3 - Booster for Pfizer series) 10/02/2019    Colorectal cancer screening: No longer required.   Lung Cancer Screening: (Low Dose CT Chest recommended if Age 72-80 years, 30 pack-year currently smoking OR have quit w/in 15years.) does not qualify.   Additional Screening:  Hepatitis C Screening: does not qualify  Vision Screening: Recommended annual ophthalmology exams for early detection of glaucoma and other disorders of the eye. Is the patient up to date with their annual eye exam?  Yes  Who is the provider or what is the name of the office in which the patient attends annual eye exams? Dr. Gloriann Loan.   Dental Screening: Recommended annual dental exams for proper oral hygiene  Community Resource Referral / Chronic Care Management: CRR required this visit?  No   CCM required this visit?  No      Plan:     I have personally reviewed and noted the following in the patients chart:   Medical and social history Use of alcohol, tobacco or illicit drugs  Current medications and supplements including opioid prescriptions. Patient is not currently taking opioid prescriptions. Functional ability and status Nutritional status Physical activity Advanced directives List of other physicians Hospitalizations, surgeries, and ER visits in previous 12 months Vitals Screenings to include cognitive, depression, and falls Referrals and appointments  In addition, I have reviewed and discussed with patient certain preventive protocols, quality metrics, and best practice recommendations. A written  personalized care plan for preventive services as well as general preventive health recommendations were provided to patient.     Clemetine Marker, LPN   3/0/1601   Nurse Notes: none

## 2021-07-16 ENCOUNTER — Other Ambulatory Visit: Payer: Self-pay | Admitting: Family Medicine

## 2021-07-16 DIAGNOSIS — J301 Allergic rhinitis due to pollen: Secondary | ICD-10-CM

## 2021-07-21 ENCOUNTER — Emergency Department
Admission: EM | Admit: 2021-07-21 | Discharge: 2021-07-21 | Disposition: A | Discharge status: Home / Self Care | Payer: Medicare HMO | Attending: Emergency Medicine | Admitting: Emergency Medicine

## 2021-07-21 ENCOUNTER — Emergency Department: Payer: Medicare HMO

## 2021-07-21 ENCOUNTER — Encounter: Payer: Self-pay | Admitting: Family Medicine

## 2021-07-21 ENCOUNTER — Ambulatory Visit (INDEPENDENT_AMBULATORY_CARE_PROVIDER_SITE_OTHER): Payer: Medicare HMO | Admitting: Family Medicine

## 2021-07-21 ENCOUNTER — Other Ambulatory Visit: Payer: Self-pay

## 2021-07-21 VITALS — BP 120/50 | HR 64 | Ht 70.0 in | Wt 146.0 lb

## 2021-07-21 DIAGNOSIS — N503 Cyst of epididymis: Secondary | ICD-10-CM | POA: Diagnosis not present

## 2021-07-21 DIAGNOSIS — N453 Epididymo-orchitis: Secondary | ICD-10-CM | POA: Insufficient documentation

## 2021-07-21 DIAGNOSIS — N50812 Left testicular pain: Secondary | ICD-10-CM

## 2021-07-21 DIAGNOSIS — N5089 Other specified disorders of the male genital organs: Secondary | ICD-10-CM | POA: Diagnosis not present

## 2021-07-21 DIAGNOSIS — N433 Hydrocele, unspecified: Secondary | ICD-10-CM | POA: Diagnosis not present

## 2021-07-21 LAB — URINALYSIS, ROUTINE W REFLEX MICROSCOPIC
Bacteria, UA: NONE SEEN
Bilirubin Urine: NEGATIVE
Glucose, UA: NEGATIVE mg/dL
Ketones, ur: NEGATIVE mg/dL
Nitrite: NEGATIVE
Protein, ur: 100 mg/dL — AB
RBC / HPF: >50 RBC/hpf — ABNORMAL HIGH (ref 0–5)
Specific Gravity, Urine: 1.016 (ref 1.005–1.030)
WBC, UA: >50 WBC/hpf — ABNORMAL HIGH (ref 0–5)
pH: 5 (ref 5.0–8.0)

## 2021-07-21 MED ORDER — TRAMADOL HCL 50 MG PO TABS: 50.0000 mg | ORAL_TABLET | Freq: Three times a day (TID) | ORAL | 0 refills | Status: DC | PRN

## 2021-07-21 MED ORDER — LEVOFLOXACIN 500 MG PO TABS: 500.0000 mg | ORAL_TABLET | Freq: Every day | ORAL | 0 refills | Status: AC

## 2021-07-21 NOTE — ED Triage Notes (Signed)
Pt was sent from Dr Ronnald Ramp offices to r/o testicular torsion, pt c/o left testicle pain

## 2021-07-21 NOTE — Progress Notes (Signed)
Date:  07/21/2021   Name:  Eric [name]   DOB:  11-Jul-1923   MRN:  [id number]   Chief Complaint: Mass (Under skin on L) side/ groin)  Male GU Problem The patient's primary symptoms include scrotal swelling and testicular pain. The patient's pertinent negatives include no genital injury, genital itching, genital lesions, pelvic pain, penile discharge, penile pain or priapism. Primary symptoms comment: pain above testicle. This is a new problem. The current episode started yesterday (Sunday). The problem occurs constantly. The problem has been gradually worsening. The pain is moderate. Pertinent negatives include no abdominal pain, dysuria, fever, flank pain, frequency or hematuria. The testicular pain affects the left testicle. There is swelling in the left testicle. The symptoms are aggravated by tactile pressure. He has tried nothing for the symptoms.   Lab Results  Component Value Date   NA 143 06/01/2021   K 4.3 06/01/2021   CO2 23 06/01/2021   GLUCOSE 101 (H) 06/01/2021   BUN 21 06/01/2021   CREATININE 1.08 06/01/2021   CALCIUM 8.5 (L) 06/01/2021   EGFR 63 06/01/2021   GFRNONAA 55 (L) 09/03/2019   Lab Results  Component Value Date   CHOL 185 12/10/2020   HDL 83 12/10/2020   LDLCALC 91 12/10/2020   TRIG 56 12/10/2020   CHOLHDL 2.5 08/24/2017   No results found for: TSH No results found for: HGBA1C Lab Results  Component Value Date   WBC 3.8 (L) 12/20/2018   HGB 11.4 (L) 12/20/2018   HCT 35.1 (L) 12/20/2018   MCV 91.2 12/20/2018   PLT 197 12/20/2018   Lab Results  Component Value Date   ALT 11 12/10/2020   AST 15 12/10/2020   ALKPHOS 47 12/10/2020   BILITOT 0.3 12/10/2020   No results found for: 25OHVITD2, 25OHVITD3, VD25OH   Review of Systems  Constitutional:  Negative for fever.  Gastrointestinal:  Negative for abdominal pain.  Genitourinary:  Positive for scrotal swelling and testicular pain. Negative for dysuria, flank pain, frequency, hematuria, pelvic  pain, penile discharge and penile pain.   Patient Active Problem List   Diagnosis Date Noted   Essential hypertension 10/22/2015   Hyperlipidemia 10/22/2015   Benign prostatic hyperplasia 10/22/2015    No Known Allergies  Past Surgical History:  Procedure Laterality Date   EYE SURGERY     cataract extraction   HERNIA REPAIR Left 2014   inguinal hernia   PACEMAKER INSERTION Left 12/27/2017   Procedure: INSERTION PACEMAKER-INITIAL DUAL CHAMBER;  Surgeon: Isaias Cowman, MD;  Location: [facility] ORS;  Service: Cardiovascular;  Laterality: Left;    Social History   Tobacco Use   Smoking status: Never   Smokeless tobacco: Never  Vaping Use   Vaping Use: Never used  Substance Use Topics   Alcohol use: No   Drug use: No     Medication list has been reviewed and updated.  Current Meds  Medication Sig   acetaminophen (TYLENOL) 325 MG tablet Take 650 mg by mouth every 6 (six) hours as needed.   amLODipine (NORVASC) 5 MG tablet    brimonidine (ALPHAGAN) 0.2 % ophthalmic solution    fluticasone (FLONASE) 50 MCG/ACT nasal spray USE 1 SPRAY IN EACH NOSTRIL EVERY DAY (NEED MD APPOINTMENT)   latanoprost (XALATAN) 0.005 % ophthalmic solution Place 1 drop into both eyes at bedtime.   losartan (COZAAR) 100 MG tablet Take 1 tablet (100 mg total) by mouth daily.   omeprazole (PRILOSEC) 20 MG capsule Take 1 capsule (20 mg total)  by mouth daily.   simvastatin (ZOCOR) 40 MG tablet Take 1 tablet (40 mg total) by mouth at bedtime.   tamsulosin (FLOMAX) 0.4 MG CAPS capsule Take 1 capsule (0.4 mg total) by mouth daily.   timolol (TIMOPTIC) 0.5 % ophthalmic solution SMARTSIG:In Eye(s)    PHQ 2/9 Scores 07/01/2021 06/01/2021 04/20/2021 12/10/2020  PHQ - 2 Score 0 0 1 2  PHQ- 9 Score 0 0 3 5    GAD 7 : Generalized Anxiety Score 06/01/2021 04/20/2021 12/10/2020 10/16/2020  Nervous, Anxious, on Edge 0 0 0 0  Control/stop worrying 0 0 0 0  Worry too much - different things 0 0 0 0  Trouble relaxing 0  0 0 0  Restless 0 0 0 0  Easily annoyed or irritable 0 0 0 0  Afraid - awful might happen 0 0 0 0  Total GAD 7 Score 0 0 0 0  Anxiety Difficulty Not difficult at all - - -    BP Readings from Last 3 Encounters:  07/21/21 (!) 120/50  06/01/21 134/74  04/20/21 112/64    Physical Exam Vitals and nursing note reviewed.  Abdominal:     Tenderness: There is no abdominal tenderness. There is no right CVA tenderness, left CVA tenderness, guarding or rebound.     Comments: Unable to eval inguinal area due to tenderness    Wt Readings from Last 3 Encounters:  07/21/21 146 lb (66.2 kg)  06/01/21 147 lb (66.7 kg)  04/20/21 144 lb (65.3 kg)    BP (!) 120/50    Pulse 64    Ht 5' 10"  (1.778 m)    Wt 146 lb (66.2 kg)    BMI 20.95 kg/m   Assessment and Plan:  1. Left testicular pain New onset 48 hours.  Patient felt a lump today.  On evaluation patient has tenderness of the left testicle more so superior to the left testicle in the epididymal area and somewhat tender over the inguinal canal.  Uncertain if there is orchitis, epididymitis, incarcerated hernia, or possibility of testicular torsion.  Will refer to emergency room where the if there is necessity to do an ultrasound of the testicular artery that it can be done in that setting.  If there is infection involving the testicle or epididymis then IV or IM antibiotics may be initiated.

## 2021-07-21 NOTE — Discharge Instructions (Signed)
Take the antibiotic as prescribed and finish the full course.  Follow-up with your urologist in 1 week.  Return to the ER for new, worsening, or persistent severe testicular, pelvic, or abdominal pain, swelling, fever, difficulty urinating, blood in the urine, or any other new or worsening symptoms that concern you.

## 2021-07-21 NOTE — ED Provider Notes (Signed)
[facility] Provider Note    Event Date/Time   First MD Initiated Contact with Patient 07/21/21 1537     (approximate)   History   Testicle Pain   HPI  Eric [name] is a 88 y.o. male with history of hypertension, high cholesterol, GERD, arthritis, and BPH who presents with left testicular pain over the last 2 days, associated with some pain on urination.  He denies any injury to the testicles or scrotum.  He reports some mild swelling on the left.  He was evaluated by his PMD and referred to the ED for further evaluation.    Physical Exam   Triage Vital Signs: ED Triage Vitals  Enc Vitals Group     BP 07/21/21 1523 136/61     Pulse Rate 07/21/21 1523 65     Resp 07/21/21 1523 18     Temp 07/21/21 1523 99.2 F (37.3 C)     Temp Source 07/21/21 1523 Oral     SpO2 07/21/21 1523 99 %     Weight 07/21/21 1537 145 lb 15.1 oz (66.2 kg)     Height 07/21/21 1537 5\' 10"  (1.778 m)     Head Circumference --      Peak Flow --      Pain Score --      Pain Loc --      Pain Edu? --      Excl. in [place]? --     Most recent vital signs: Vitals:   07/21/21 1523 07/21/21 1721  BP: 136/61 (!) 188/72  Pulse: 65 65  Resp: 18 17  Temp: 99.2 F (37.3 C)   SpO2: 99% 99%    General: Awake, no distress.  Well-appearing for age. CV:  Good peripheral perfusion.  Resp:  Normal effort.  Abd:  Soft and nontender.  No distention.  Other:  Left scrotum with minimal swelling.  Mild tenderness to the left testicle and moderate tenderness to the epididymis and left inguinal region.  No palpable lymphadenopathy.  No blood or discharge at the penile meatus.   ED Results / Procedures / Treatments   Labs (all labs ordered are listed, but only abnormal results are displayed) Labs Reviewed  URINALYSIS, ROUTINE W REFLEX MICROSCOPIC - Abnormal; Notable for the following components:      Result Value   Color, Urine YELLOW (*)    APPearance TURBID (*)    Hgb urine  dipstick LARGE (*)    Protein, ur 100 (*)    Leukocytes,Ua LARGE (*)    RBC / HPF >50 (*)    WBC, UA >50 (*)    Non Squamous Epithelial PRESENT (*)    All other components within normal limits     EKG    RADIOLOGY  US scrotum: I independently viewed and interpreted the images; left testicle and epididymis are hypervascular, possible epididymoorchitis  PROCEDURES:  Critical Care performed: No  Procedures   MEDICATIONS ORDERED IN ED: Medications - No data to display   IMPRESSION / MDM / [place] / ED COURSE  I reviewed the triage vital signs and the nursing notes.  88 year old male with PMH as noted above presents with left testicular pain over the last 2 days.  I reviewed the past medical records; the patient was evaluated by his primary physician Dr. Ronnald Ramp earlier today and referred to the ED for evaluation of the testicular pain.  Differential based on her evaluation included orchitis, epididymitis, incarcerated hernia, or torsion.  On my exam the vital signs are normal except for borderline elevated temperature.  The patient has mild swelling on the left and tenderness primarily to the epididymis and left inguinal region.  Differential diagnosis includes, but is not limited to, epididymitis, orchitis, inguinal hernia, testicular torsion, UTI.  We will obtain an ultrasound, urinalysis, and reassess.  ----------------------------------------- 6:22 PM on 07/21/2021 -----------------------------------------  Ultrasound shows evidence of epididymoorchitis.  There are also 2 small fluid collections which could be abscesses.  The urinalysis shows significant RBCs and WBCs but no bacteria.  Overall presentation is consistent with epididymal orchitis.  The patient feels well and wants to go home.  I consulted Dr. Diamantina Providence from urology who advises that the patient does not require any urgent intervention for the fluid collections seen on the study, and can follow-up  within 1 to 2 weeks with urology.  I counseled the patient and his family members on the results of the work-up.  We will prescribe 10 days of levofloxacin and some pain medication.  Return precautions given, and they expressed understanding.   FINAL CLINICAL IMPRESSION(S) / ED DIAGNOSES   Final diagnoses:  Epididymoorchitis     Rx / DC Orders   ED Discharge Orders          Ordered    levofloxacin (LEVAQUIN) 500 MG tablet  Daily        07/21/21 1733    traMADol (ULTRAM) 50 MG tablet  Every 8 hours PRN        07/21/21 1733             Note:  This document was prepared using Dragon voice recognition software and may include unintentional dictation errors.    Arta Silence, MD 07/21/21 [phone]

## 2021-08-04 ENCOUNTER — Other Ambulatory Visit: Payer: Self-pay | Admitting: Family Medicine

## 2021-08-04 DIAGNOSIS — R001 Bradycardia, unspecified: Secondary | ICD-10-CM | POA: Diagnosis not present

## 2021-08-04 NOTE — Telephone Encounter (Signed)
Medication Refill - Medication: amlodipine '5MG'$    ? ?Has the patient contacted their pharmacy? No. ? ?(Agent: If no, request that the patient contact the pharmacy for the refill. If patient does not wish to contact the pharmacy document the reason why and proceed with request.) Caller unsure if PCP d/c this medication and if she did not caller requesting a refill    ? ? ?Preferred Pharmacy (with phone number or street name):  ?[facility] [place], [place] [facility] Phone:  [phone]  ?Fax:  [phone]  ?  ? ? ?Has the patient been seen for an appointment in the last year OR does the patient have an upcoming appointment? Yes.   ? ?Agent: Please be advised that RX refills may take up to 3 business days. We ask that you follow-up with your pharmacy. ?

## 2021-08-05 NOTE — Telephone Encounter (Signed)
Requested medication (s) are due for refill today: amount not specified ? ?Requested medication (s) are on the active medication list: yes   ? ?Last refill: 10/16/20 ? ?Future visit scheduled yes 12/01/21 ? ?Notes to clinic:Historical provider, please review. Thank you ? ?Requested Prescriptions  ?Pending Prescriptions Disp Refills  ? amLODipine (NORVASC) 5 MG tablet    ?  ? Cardiovascular: Calcium Channel Blockers 2 Failed - 08/05/2021 10:41 AM  ?  ?  Failed - Last BP in normal range  ?  BP Readings from Last 1 Encounters:  ?07/21/21 (!) 188/72  ?  ?  ?  ?  Passed - Last Heart Rate in normal range  ?  Pulse Readings from Last 1 Encounters:  ?07/21/21 65  ?  ?  ?  ?  Passed - Valid encounter within last 6 months  ?  Recent Outpatient Visits   ? ?      ? 2 weeks ago Left testicular pain  ? [facility] Juline Patch, MD  ? 2 months ago Essential hypertension  ? [facility] Juline Patch, MD  ? 3 months ago UTI symptoms  ? [facility] Juline Patch, MD  ? 4 months ago Eczema of lower leg  ? [facility] Juline Patch, MD  ? 7 months ago Seasonal allergic rhinitis due to pollen  ? [facility] Juline Patch, MD  ? ?  ?  ?Future Appointments   ? ?        ? In 3 months Juline Patch, MD [facility], [place]  ? ?  ? ?  ?  ?  ? ? ? ? ?

## 2021-08-17 ENCOUNTER — Encounter (HOSPITAL_COMMUNITY): Payer: Self-pay | Admitting: Radiology

## 2021-08-17 ENCOUNTER — Other Ambulatory Visit: Payer: Self-pay | Admitting: Family Medicine

## 2021-08-17 NOTE — Telephone Encounter (Signed)
Medication Refill - Medication: amLODipine (NORVASC) 5 MG tablet ? ?Has the patient contacted their pharmacy? No. ?It was at [facility], but they have closed ?Preferred Pharmacy (with phone number or street name): [id number] [name], [facility] ? ?Has the patient been seen for an appointment in the last year OR does the patient have an upcoming appointment? Yes.   ? ?Agent: Please be advised that RX refills may take up to 3 business days. We ask that you follow-up with your pharmacy. ? ?[name] relative calling for refill.  Eric [name] has never filled. She said they had a surplus amount ?But now he is out. ? ?

## 2021-08-19 NOTE — Telephone Encounter (Signed)
Requested medication (s) are due for refill today:   Not sure ? ?Requested medication (s) are on the active medication list:   Yes as a historical med ? ?Future visit scheduled:   Yes ? ? ?Last ordered: Historical provider ? ?Returned due to being prescribed by a historical provider  ? ?Requested Prescriptions  ?Pending Prescriptions Disp Refills  ? amLODipine (NORVASC) 5 MG tablet    ?  ? Cardiovascular: Calcium Channel Blockers 2 Failed - 08/17/2021 10:06 PM  ?  ?  Failed - Last BP in normal range  ?  BP Readings from Last 1 Encounters:  ?07/21/21 (!) 188/72  ?  ?  ?  ?  Passed - Last Heart Rate in normal range  ?  Pulse Readings from Last 1 Encounters:  ?07/21/21 65  ?  ?  ?  ?  Passed - Valid encounter within last 6 months  ?  Recent Outpatient Visits   ? ?      ? 4 weeks ago Left testicular pain  ? [facility] Juline Patch, MD  ? 2 months ago Essential hypertension  ? [facility] Juline Patch, MD  ? 4 months ago UTI symptoms  ? [facility] Juline Patch, MD  ? 5 months ago Eczema of lower leg  ? [facility] Juline Patch, MD  ? 8 months ago Seasonal allergic rhinitis due to pollen  ? [facility] Juline Patch, MD  ? ?  ?  ?Future Appointments   ? ?        ? In 3 months Juline Patch, MD [facility], [place]  ? ?  ? ?  ?  ?  ? ?

## 2021-09-07 ENCOUNTER — Encounter: Payer: Self-pay | Admitting: Family Medicine

## 2021-09-07 ENCOUNTER — Ambulatory Visit (INDEPENDENT_AMBULATORY_CARE_PROVIDER_SITE_OTHER): Payer: Medicare HMO | Admitting: Family Medicine

## 2021-09-07 VITALS — BP 138/88 | HR 70 | Ht 70.0 in | Wt 143.0 lb

## 2021-09-07 DIAGNOSIS — N451 Epididymitis: Secondary | ICD-10-CM

## 2021-09-07 NOTE — Progress Notes (Signed)
? ? ?Date:  09/07/2021  ? ?Name:  Burdett Pinzon   DOB:  April 21, 1924   MRN:  [id number] ? ? ?Chief Complaint: consultation concerning [place] (Hydrocele noted on [place]) ? ?Male GU Problem ?The patient's pertinent negatives include no genital injury, genital itching, genital lesions, scrotal swelling or testicular pain. This is a new problem. The current episode started 1 to 4 weeks ago. The patient is experiencing no pain. Pertinent negatives include no abdominal pain, chest pain, chills, constipation, coughing, diarrhea, dysuria, fever, frequency, headaches, hesitancy, nausea, rash, shortness of breath, sore throat or urgency. He has tried antibiotics (for epididymitis) for the symptoms. The treatment provided significant relief.  ? ?Lab Results  ?Component Value Date  ? NA 143 06/01/2021  ? K 4.3 06/01/2021  ? CO2 23 06/01/2021  ? GLUCOSE 101 (H) 06/01/2021  ? BUN 21 06/01/2021  ? CREATININE 1.08 06/01/2021  ? CALCIUM 8.5 (L) 06/01/2021  ? EGFR 63 06/01/2021  ? GFRNONAA 55 (L) 09/03/2019  ? ?Lab Results  ?Component Value Date  ? CHOL 185 12/10/2020  ? HDL 83 12/10/2020  ? [place] 91 12/10/2020  ? TRIG 56 12/10/2020  ? CHOLHDL 2.5 08/24/2017  ? ?No results found for: TSH ?No results found for: HGBA1C ?Lab Results  ?Component Value Date  ? WBC 3.8 (L) 12/20/2018  ? HGB 11.4 (L) 12/20/2018  ? HCT 35.1 (L) 12/20/2018  ? MCV 91.2 12/20/2018  ? PLT 197 12/20/2018  ? ?Lab Results  ?Component Value Date  ? ALT 11 12/10/2020  ? AST 15 12/10/2020  ? ALKPHOS 47 12/10/2020  ? BILITOT 0.3 12/10/2020  ? ?No results found for: 25OHVITD2, [place], VD25OH  ? ?Review of Systems  ?Constitutional:  Negative for chills and fever.  ?HENT:  Negative for drooling, ear discharge, ear pain and sore throat.   ?Respiratory:  Negative for cough, shortness of breath and wheezing.   ?Cardiovascular:  Negative for chest pain, palpitations and leg swelling.  ?Gastrointestinal:  Negative for abdominal pain, blood in stool, constipation, diarrhea and  nausea.  ?Endocrine: Negative for polydipsia.  ?Genitourinary:  Negative for dysuria, frequency, hematuria, hesitancy, scrotal swelling, testicular pain and urgency.  ?Musculoskeletal:  Negative for back pain, myalgias and neck pain.  ?Skin:  Negative for rash.  ?Allergic/Immunologic: Negative for environmental allergies.  ?Neurological:  Negative for dizziness and headaches.  ?Hematological:  Does not bruise/bleed easily.  ?Psychiatric/Behavioral:  Negative for suicidal ideas. The patient is not nervous/anxious.   ? ?Patient Active Problem List  ? Diagnosis Date Noted  ? Essential hypertension 10/22/2015  ? Hyperlipidemia 10/22/2015  ? Benign prostatic hyperplasia 10/22/2015  ? ? ?No Known Allergies ? ?Past Surgical History:  ?Procedure Laterality Date  ? EYE SURGERY    ? cataract extraction  ? HERNIA REPAIR Left 2014  ? inguinal hernia  ? PACEMAKER INSERTION Left 12/27/2017  ? Procedure: INSERTION PACEMAKER-INITIAL DUAL CHAMBER;  Surgeon: Isaias Cowman, MD;  Location: [facility] ORS;  Service: Cardiovascular;  Laterality: Left;  ? ? ?Social History  ? ?Tobacco Use  ? Smoking status: Never  ? Smokeless tobacco: Never  ?Vaping Use  ? Vaping Use: Never used  ?Substance Use Topics  ? Alcohol use: No  ? Drug use: No  ? ? ? ?Medication list has been reviewed and updated. ? ?Current Meds  ?Medication Sig  ? acetaminophen (TYLENOL) 325 MG tablet Take 650 mg by mouth every 6 (six) hours as needed.  ? amLODipine (NORVASC) 5 MG tablet   ? brimonidine (ALPHAGAN) 0.2 %  ophthalmic solution   ? fluticasone (FLONASE) 50 MCG/ACT nasal spray USE 1 SPRAY IN EACH NOSTRIL EVERY DAY (NEED MD APPOINTMENT)  ? latanoprost (XALATAN) 0.005 % ophthalmic solution Place 1 drop into both eyes at bedtime.  ? losartan (COZAAR) 100 MG tablet Take 1 tablet (100 mg total) by mouth daily.  ? omeprazole (PRILOSEC) 20 MG capsule Take 1 capsule (20 mg total) by mouth daily.  ? simvastatin (ZOCOR) 40 MG tablet Take 1 tablet (40 mg total) by mouth at  bedtime.  ? tamsulosin (FLOMAX) 0.4 MG CAPS capsule Take 1 capsule (0.4 mg total) by mouth daily.  ? timolol (TIMOPTIC) 0.5 % ophthalmic solution SMARTSIG:In Eye(s)  ? ? ? ?  09/07/2021  ?  1:26 PM 06/01/2021  ? 11:20 AM 04/20/2021  ?  3:15 PM 12/10/2020  ? 11:28 AM  ?GAD 7 : Generalized Anxiety Score  ?Nervous, Anxious, on Edge 0 0 0 0  ?Control/stop worrying 0 0 0 0  ?Worry too much - different things 0 0 0 0  ?Trouble relaxing 0 0 0 0  ?Restless 0 0 0 0  ?Easily annoyed or irritable 0 0 0 0  ?Afraid - awful might happen 0 0 0 0  ?Total GAD 7 Score 0 0 0 0  ?Anxiety Difficulty Not difficult at all Not difficult at all    ? ? ? ?  09/07/2021  ?  1:26 PM  ?Depression screen PHQ 2/9  ?Decreased Interest 0  ?Down, Depressed, Hopeless 0  ?PHQ - 2 Score 0  ?Altered sleeping 0  ?Tired, decreased energy 0  ?Change in appetite 0  ?Feeling bad or failure about yourself  0  ?Trouble concentrating 0  ?Moving slowly or fidgety/restless 0  ?Suicidal thoughts 0  ?PHQ-9 Score 0  ?Difficult doing work/chores Not difficult at all  ? ? ?BP Readings from Last 3 Encounters:  ?09/07/21 138/88  ?07/21/21 (!) 188/72  ?07/21/21 (!) 120/50  ? ? ?Physical Exam ?Vitals and nursing note reviewed.  ?Cardiovascular:  ?   Rate and Rhythm: Normal rate.  ?   Heart sounds: No murmur heard. ?  No friction rub. No gallop.  ?Pulmonary:  ?   Breath sounds: No wheezing or rhonchi.  ?Genitourinary: ?   Testes: Normal. Cremasteric reflex is present.     ?   Right: Mass, tenderness, swelling, testicular hydrocele or varicocele not present.     ?   Left: Mass, tenderness, swelling, testicular hydrocele or varicocele not present.  ?   Epididymis:  ?   Right: Normal. Not inflamed or enlarged.  ?   Left: Normal. Not inflamed or enlarged.  ? ? ?Wt Readings from Last 3 Encounters:  ?09/07/21 143 lb (64.9 kg)  ?07/21/21 145 lb 15.1 oz (66.2 kg)  ?07/21/21 146 lb (66.2 kg)  ? ? ?BP 138/88   Pulse 70   Ht _0  (1.778 m)   Wt 143 lb (64.9 kg)   SpO2 99%   BMI  20.52 kg/m?  ? ?Assessment and Plan: ? ?1. Epididymitis ?New onset.  Left testicular pain evaluated 2 weeks ago.  Pain has completely resolved.  Swelling has completely resolved.  There is no palpable tenderness no palpable residual hydrocele or tenderness at the epididymis.  Patient has completed course of Levaquin and has done very well and we are almost 3 weeks out from the onset of the pain.  Patient has completely resolved symptoms and will return if there is any return of pain or tenderness. ? ? ?

## 2021-09-14 ENCOUNTER — Other Ambulatory Visit: Payer: Self-pay | Admitting: Family Medicine

## 2021-09-14 ENCOUNTER — Telehealth: Payer: Self-pay

## 2021-09-14 NOTE — Telephone Encounter (Signed)
Called Eric [name] to discuss the reason why pt needs knee xray. She stated she didn't know and she would get the pt to call back to discuss it ?

## 2021-09-14 NOTE — Telephone Encounter (Signed)
Medication Refill - Medication:  ?amLODipine (NORVASC) 5 MG tablet ? ?Has the patient contacted their pharmacy? Yes.   ?Contact PCP ? ?Preferred Pharmacy (with phone number or street name):  ?[facility] [place], [place] [facility] Phone:  [phone]  ?Fax:  [phone]  ?  ? ?Has the patient been seen for an appointment in the last year OR does the patient have an upcoming appointment? Yes.   ? ?Agent: Please be advised that RX refills may take up to 3 business days. We ask that you follow-up with your pharmacy. ?

## 2021-09-14 NOTE — Telephone Encounter (Signed)
Copied from [place] [phone]. Topic: Referral - Request for Referral ?>> Sep 14, 2021  9:31 AM Valere Dross wrote: ?Reason for CRM: Pts daughter called in wanting to know if PCP could get a Xray schedule for pt right knee, please advise. ?

## 2021-09-15 NOTE — Telephone Encounter (Signed)
Requested medication (s) are due for refill today: historical medication ? ?Requested medication (s) are on the active medication list: yes ? ?Last refill:  08/26/20 ? ?Future visit scheduled: yes in 2 months ? ?Notes to clinic:  historical medication. Do you want to order Rx? ? ? ?  ?Requested Prescriptions  ?Pending Prescriptions Disp Refills  ? amLODipine (NORVASC) 5 MG tablet    ?  ? Cardiovascular: Calcium Channel Blockers 2 Passed - 09/14/2021  1:23 PM  ?  ?  Passed - Last BP in normal range  ?  BP Readings from Last 1 Encounters:  ?09/07/21 138/88  ?  ?  ?  ?  Passed - Last Heart Rate in normal range  ?  Pulse Readings from Last 1 Encounters:  ?09/07/21 70  ?  ?  ?  ?  Passed - Valid encounter within last 6 months  ?  Recent Outpatient Visits   ? ?      ? 1 week ago Epididymitis  ? [facility] Juline Patch, MD  ? 1 month ago Left testicular pain  ? [facility] Juline Patch, MD  ? 3 months ago Essential hypertension  ? [facility] Juline Patch, MD  ? 4 months ago UTI symptoms  ? [facility] Juline Patch, MD  ? 6 months ago Eczema of lower leg  ? [facility] Juline Patch, MD  ? ?  ?  ?Future Appointments   ? ?        ? In 2 months Juline Patch, MD [facility], [place]  ? ?  ? ? ?  ?  ?  ? ?

## 2021-09-18 ENCOUNTER — Telehealth: Payer: Self-pay

## 2021-09-18 ENCOUNTER — Telehealth: Payer: Self-pay | Admitting: Family Medicine

## 2021-09-18 NOTE — Telephone Encounter (Signed)
Called Eric [name] after "Shon" called to inquire about home health and knee xray. I asked if pt was having knee pain and she said, "Yes." I gave her an appt for pt on Monday. Dr Ronnald Ramp will eval. And order xray. I asked that she give a message to [facility] since she is not on his chart as a contact. ?

## 2021-09-18 NOTE — Telephone Encounter (Signed)
Copied from [place]. Topic: General - Other ?>> Sep 18, 2021 12:57 PM Yvette Rack wrote: ?Reason for CRM: Pt daughter Scot Dock requests referral for pt to receive home health care. Shon also requests call back because she has questions/concerns about Ensure and the last time pt had knee x-ray. Cb# [phone] ?

## 2021-09-21 ENCOUNTER — Ambulatory Visit: Payer: Medicare HMO | Admitting: Family Medicine

## 2021-09-22 ENCOUNTER — Telehealth: Payer: Self-pay | Admitting: Family Medicine

## 2021-09-22 NOTE — Telephone Encounter (Signed)
Copied from [place] [phone]. Topic: General - Other ?>> Sep 22, 2021  3:23 PM Leward Quan A wrote: ?Reason for CRM: [name] with patient called in to inquire of Dr Ronnald Ramp if patient should be still taking the Rx amLODipine (NORVASC) 5 MG tablet stated that it is on his med list as active but per chart have never been filled by Dr Ronnald Ramp. Marliss Coots is needing to clarify if patient need to still take this medication please call [name] at Ph# [phone] ?

## 2021-09-24 ENCOUNTER — Other Ambulatory Visit: Payer: Self-pay | Admitting: Family Medicine

## 2021-09-24 NOTE — Telephone Encounter (Signed)
Requested medication (s) are due for refill today:  ? ?Requested medication (s) are on the active medication list:  ? ?Last refill:  08/26/20 ? ?Future visit scheduled:  ? ?Notes to clinic:  Duplicate request. ? ? ? ?Requested Prescriptions  ?Pending Prescriptions Disp Refills  ? amLODipine (NORVASC) 5 MG tablet    ?  ? Cardiovascular: Calcium Channel Blockers 2 Passed - 09/24/2021  2:25 PM  ?  ?  Passed - Last BP in normal range  ?  BP Readings from Last 1 Encounters:  ?09/07/21 138/88  ?  ?  ?  ?  Passed - Last Heart Rate in normal range  ?  Pulse Readings from Last 1 Encounters:  ?09/07/21 70  ?  ?  ?  ?  Passed - Valid encounter within last 6 months  ?  Recent Outpatient Visits   ? ?      ? 2 weeks ago Epididymitis  ? [facility] Juline Patch, MD  ? 2 months ago Left testicular pain  ? [facility] Juline Patch, MD  ? 3 months ago Essential hypertension  ? [facility] Juline Patch, MD  ? 5 months ago UTI symptoms  ? [facility] Juline Patch, MD  ? 6 months ago Eczema of lower leg  ? [facility] Juline Patch, MD  ? ?  ?  ?Future Appointments   ? ?        ? In 2 months Juline Patch, MD [facility] [place]  ? ?  ? ? ?  ?  ?  ? ?

## 2021-09-24 NOTE — Telephone Encounter (Signed)
Medication Refill - Medication:  ?amLODipine (NORVASC) 5 MG tablet  ? ?Has the patient contacted their pharmacy? Yes.   ?Contact PCP ? ?Preferred Pharmacy (with phone number or street name):  ?[facility], [place]  ?[place], [place] [address]  ?Phone:  [phone]  Fax:  [phone]  ? ?Has the patient been seen for an appointment in the last year OR does the patient have an upcoming appointment? Yes.   ? ?Agent: Please be advised that RX refills may take up to 3 business days. We ask that you follow-up with your pharmacy. ?

## 2021-10-05 DIAGNOSIS — R001 Bradycardia, unspecified: Secondary | ICD-10-CM | POA: Diagnosis not present

## 2021-10-05 DIAGNOSIS — Z950 Presence of cardiac pacemaker: Secondary | ICD-10-CM | POA: Diagnosis not present

## 2021-10-05 DIAGNOSIS — I10 Essential (primary) hypertension: Secondary | ICD-10-CM | POA: Diagnosis not present

## 2021-10-05 DIAGNOSIS — I5022 Chronic systolic (congestive) heart failure: Secondary | ICD-10-CM | POA: Diagnosis not present

## 2021-10-05 DIAGNOSIS — E782 Mixed hyperlipidemia: Secondary | ICD-10-CM | POA: Diagnosis not present

## 2021-12-01 ENCOUNTER — Encounter: Payer: Self-pay | Admitting: Family Medicine

## 2021-12-01 ENCOUNTER — Ambulatory Visit (INDEPENDENT_AMBULATORY_CARE_PROVIDER_SITE_OTHER): Payer: Medicare HMO | Admitting: Family Medicine

## 2021-12-01 VITALS — BP 172/80 | HR 80 | Ht 70.0 in | Wt 141.0 lb

## 2021-12-01 DIAGNOSIS — K219 Gastro-esophageal reflux disease without esophagitis: Secondary | ICD-10-CM | POA: Diagnosis not present

## 2021-12-01 DIAGNOSIS — N401 Enlarged prostate with lower urinary tract symptoms: Secondary | ICD-10-CM | POA: Diagnosis not present

## 2021-12-01 DIAGNOSIS — E785 Hyperlipidemia, unspecified: Secondary | ICD-10-CM | POA: Diagnosis not present

## 2021-12-01 DIAGNOSIS — I10 Essential (primary) hypertension: Secondary | ICD-10-CM

## 2021-12-01 MED ORDER — SIMVASTATIN 40 MG PO TABS: 40.0000 mg | ORAL_TABLET | Freq: Every day | ORAL | 1 refills | Status: DC

## 2021-12-01 MED ORDER — LOSARTAN POTASSIUM 100 MG PO TABS: 100.0000 mg | ORAL_TABLET | Freq: Every day | ORAL | 1 refills | Status: DC

## 2021-12-01 MED ORDER — AMLODIPINE BESYLATE 5 MG PO TABS: 5.0000 mg | ORAL_TABLET | Freq: Two times a day (BID) | ORAL | 1 refills | Status: DC

## 2021-12-01 MED ORDER — OMEPRAZOLE 20 MG PO CPDR: 20.0000 mg | DELAYED_RELEASE_CAPSULE | Freq: Every day | ORAL | 1 refills | Status: DC

## 2021-12-01 MED ORDER — TAMSULOSIN HCL 0.4 MG PO CAPS: 0.4000 mg | ORAL_CAPSULE | Freq: Every day | ORAL | 1 refills | Status: DC

## 2021-12-01 NOTE — Progress Notes (Signed)
Date:  12/01/2021   Name:  Eric [name]   DOB:  [birth year]   MRN:  [id number]   Chief Complaint: Benign Prostatic Hypertrophy, Hyperlipidemia, Hypertension, and Gastroesophageal Reflux  Benign Prostatic Hypertrophy This is a chronic problem. The problem has been gradually improving since onset. Irritative symptoms do not include frequency, nocturia or urgency. Pertinent negatives include no chills, dysuria, hematuria or nausea.  Hyperlipidemia This is a chronic problem. The current episode started more than 1 year ago. The problem is controlled. Recent lipid tests were reviewed and are normal. He has no history of chronic renal disease or diabetes. Pertinent negatives include no chest pain, myalgias or shortness of breath. Current antihyperlipidemic treatment includes statins. The current treatment provides moderate improvement of lipids. Risk factors for coronary artery disease include hypertension and dyslipidemia.  Hypertension This is a chronic problem. The problem has been gradually improving since onset. The problem is uncontrolled. Pertinent negatives include no chest pain, headaches, neck pain, palpitations, peripheral edema or shortness of breath. Risk factors for coronary artery disease include dyslipidemia. Past treatments include angiotensin blockers and calcium channel blockers. There is no history of chronic renal disease.  Gastroesophageal Reflux He reports no abdominal pain, no chest pain, no coughing, no heartburn, no nausea, no sore throat or no wheezing. This is a chronic problem. He has tried a PPI for the symptoms.    Lab Results  Component Value Date   NA 143 06/01/2021   K 4.3 06/01/2021   CO2 23 06/01/2021   GLUCOSE 101 (H) 06/01/2021   BUN 21 06/01/2021   CREATININE 1.08 06/01/2021   CALCIUM 8.5 (L) 06/01/2021   EGFR 63 06/01/2021   GFRNONAA 55 (L) 09/03/2019   Lab Results  Component Value Date   CHOL 185 12/10/2020   HDL 83 12/10/2020   LDLCALC 91  12/10/2020   TRIG 56 12/10/2020   CHOLHDL 2.5 08/24/2017   No results found for: "TSH" No results found for: "HGBA1C" Lab Results  Component Value Date   WBC 3.8 (L) 12/20/2018   HGB 11.4 (L) 12/20/2018   HCT 35.1 (L) 12/20/2018   MCV 91.2 12/20/2018   PLT 197 12/20/2018   Lab Results  Component Value Date   ALT 11 12/10/2020   AST 15 12/10/2020   ALKPHOS 47 12/10/2020   BILITOT 0.3 12/10/2020   No results found for: "25OHVITD2", "25OHVITD3", "VD25OH"   Review of Systems  Constitutional:  Negative for chills and fever.  HENT:  Negative for drooling, ear discharge, ear pain and sore throat.   Respiratory:  Negative for cough, shortness of breath and wheezing.   Cardiovascular:  Negative for chest pain, palpitations and leg swelling.  Gastrointestinal:  Negative for abdominal pain, blood in stool, constipation, diarrhea, heartburn and nausea.  Endocrine: Negative for polydipsia.  Genitourinary:  Negative for dysuria, frequency, hematuria, nocturia and urgency.  Musculoskeletal:  Negative for back pain, myalgias and neck pain.  Skin:  Negative for rash.  Allergic/Immunologic: Negative for environmental allergies.  Neurological:  Negative for dizziness and headaches.  Hematological:  Does not bruise/bleed easily.  Psychiatric/Behavioral:  Negative for suicidal ideas. The patient is not nervous/anxious.     Patient Active Problem List   Diagnosis Date Noted   Essential hypertension 10/22/2015   Hyperlipidemia 10/22/2015   Benign prostatic hyperplasia 10/22/2015    No Known Allergies  Past Surgical History:  Procedure Laterality Date   EYE SURGERY     cataract extraction   HERNIA REPAIR Left 2014  inguinal hernia   PACEMAKER INSERTION Left 12/27/2017   Procedure: INSERTION PACEMAKER-INITIAL DUAL CHAMBER;  Surgeon: Isaias Cowman, MD;  Location: [facility] ORS;  Service: Cardiovascular;  Laterality: Left;    Social History   Tobacco Use   Smoking status: Never    Smokeless tobacco: Never  Vaping Use   Vaping Use: Never used  Substance Use Topics   Alcohol use: No   Drug use: No     Medication list has been reviewed and updated.  Current Meds  Medication Sig   acetaminophen (TYLENOL) 325 MG tablet Take 650 mg by mouth every 6 (six) hours as needed.   amLODipine (NORVASC) 5 MG tablet    brimonidine (ALPHAGAN) 0.2 % ophthalmic solution    fluticasone (FLONASE) 50 MCG/ACT nasal spray USE 1 SPRAY IN EACH NOSTRIL EVERY DAY (NEED MD APPOINTMENT)   latanoprost (XALATAN) 0.005 % ophthalmic solution Place 1 drop into both eyes at bedtime.   losartan (COZAAR) 100 MG tablet Take 1 tablet (100 mg total) by mouth daily.   omeprazole (PRILOSEC) 20 MG capsule Take 1 capsule (20 mg total) by mouth daily.   simvastatin (ZOCOR) 40 MG tablet Take 1 tablet (40 mg total) by mouth at bedtime.   tamsulosin (FLOMAX) 0.4 MG CAPS capsule Take 1 capsule (0.4 mg total) by mouth daily.   timolol (TIMOPTIC) 0.5 % ophthalmic solution SMARTSIG:In Eye(s)       12/01/2021    9:11 AM 09/07/2021    1:26 PM 06/01/2021   11:20 AM 04/20/2021    3:15 PM  GAD 7 : Generalized Anxiety Score  Nervous, Anxious, on Edge 0 0 0 0  Control/stop worrying 0 0 0 0  Worry too much - different things 0 0 0 0  Trouble relaxing 0 0 0 0  Restless 0 0 0 0  Easily annoyed or irritable 0 0 0 0  Afraid - awful might happen 0 0 0 0  Total GAD 7 Score 0 0 0 0  Anxiety Difficulty Not difficult at all Not difficult at all Not difficult at all        12/01/2021    9:10 AM 09/07/2021    1:26 PM 07/01/2021    8:09 AM  Depression screen PHQ 2/9  Decreased Interest 0 0 0  Down, Depressed, Hopeless 0 0 0  PHQ - 2 Score 0 0 0  Altered sleeping 0 0 0  Tired, decreased energy 0 0 0  Change in appetite 0 0 0  Feeling bad or failure about yourself  0 0 0  Trouble concentrating 0 0 0  Moving slowly or fidgety/restless 0 0 0  Suicidal thoughts 0 0 0  PHQ-9 Score 0 0 0  Difficult doing work/chores Not  difficult at all Not difficult at all Not difficult at all    BP Readings from Last 3 Encounters:  12/01/21 (!) 172/80  09/07/21 138/88  07/21/21 (!) 188/72    Physical Exam Vitals and nursing note reviewed.  HENT:     Head: Normocephalic.     Right Ear: External ear normal.     Left Ear: External ear normal.     Nose: Nose normal.  Eyes:     General: No scleral icterus.       Right eye: No discharge.        Left eye: No discharge.     Conjunctiva/sclera: Conjunctivae normal.     Pupils: Pupils are equal, round, and reactive to light.  Neck:  Thyroid: No thyromegaly.     Vascular: No JVD.     Trachea: No tracheal deviation.  Cardiovascular:     Rate and Rhythm: Normal rate and regular rhythm.     Heart sounds: S1 normal and S2 normal. Murmur heard.     Systolic murmur is present with a grade of 1/6.     No diastolic murmur is present.     No friction rub. No gallop. No S3 or S4 sounds.  Pulmonary:     Effort: No respiratory distress.     Breath sounds: Normal breath sounds. No decreased breath sounds, wheezing, rhonchi or rales.  Abdominal:     General: Bowel sounds are normal.     Palpations: Abdomen is soft. There is no hepatomegaly, splenomegaly or mass.     Tenderness: There is no abdominal tenderness. There is no guarding or rebound.  Musculoskeletal:        General: No tenderness. Normal range of motion.     Cervical back: Normal range of motion and neck supple.  Lymphadenopathy:     Cervical: No cervical adenopathy.  Skin:    General: Skin is warm.     Findings: No rash.  Neurological:     Mental Status: He is alert and oriented to person, place, and time.     Cranial Nerves: No cranial nerve deficit.     Deep Tendon Reflexes: Reflexes are normal and symmetric.     Wt Readings from Last 3 Encounters:  12/01/21 141 lb (64 kg)  09/07/21 143 lb (64.9 kg)  07/21/21 145 lb 15.1 oz (66.2 kg)    BP (!) 172/80 (BP Location: Left Arm, Cuff Size: Large)    Pulse 80   Ht _0  (1.778 m)   Wt 141 lb (64 kg)   BMI 20.23 kg/m   Assessment and Plan:  1. Essential hypertension Chronic.  Controlled.  Stable.  Blood pressure is still elevated at 172/80.  We will continue losartan 100 mg once a day but increase his amlodipine to 10 mg but take it as 5 mg twice a day so was not to have it done but in fact.  We will check CMP for current electrolytes and GFR. - losartan (COZAAR) 100 MG tablet; Take 1 tablet (100 mg total) by mouth daily.  Dispense: 90 tablet; Refill: 1 - Comprehensive Metabolic Panel (CMET) - amLODipine (NORVASC) 5 MG tablet; Take 1 tablet (5 mg total) by mouth 2 (two) times daily.  Dispense: 60 tablet; Refill: 1  2. Gastroesophageal reflux disease Chronic.  Controlled.  Stable.  Continue omeprazole 20 mg once a day. - omeprazole (PRILOSEC) 20 MG capsule; Take 1 capsule (20 mg total) by mouth daily.  Dispense: 90 capsule; Refill: 1  3. Hyperlipidemia, unspecified hyperlipidemia type Chronic.  Controlled.  Stable.  Continue simvastatin 40 mg nightly.  Will check lipid panel for current status of LDL. - simvastatin (ZOCOR) 40 MG tablet; Take 1 tablet (40 mg total) by mouth at bedtime.  Dispense: 90 tablet; Refill: 1 - Lipid Panel With LDL/HDL Ratio  4. Benign prostatic hyperplasia with lower urinary tract symptoms, symptom details unspecified Chronic.  Controlled.  Stable.  Asymptomatic with no decrease in stream or urinary incontinence or nocturia.  We will continue tamsulosin 0.4 mg daily and will recheck in 6 months. - tamsulosin (FLOMAX) 0.4 MG CAPS capsule; Take 1 capsule (0.4 mg total) by mouth daily.  Dispense: 90 capsule; Refill: 1

## 2021-12-02 LAB — LIPID PANEL WITH LDL/HDL RATIO
Cholesterol, Total: 206 mg/dL — ABNORMAL HIGH (ref 100–199)
HDL: 84 mg/dL (ref >39)
LDL Chol Calc (NIH): 109 mg/dL — ABNORMAL HIGH (ref 0–99)
LDL/HDL Ratio: 1.3 ratio (ref 0.0–3.6)
Triglycerides: 72 mg/dL (ref 0–149)
VLDL Cholesterol Cal: 13 mg/dL (ref 5–40)

## 2021-12-02 LAB — COMPREHENSIVE METABOLIC PANEL
ALT: 8 IU/L (ref 0–44)
AST: 17 IU/L (ref 0–40)
Albumin/Globulin Ratio: 1.4 (ref 1.2–2.2)
Albumin: 4.2 g/dL (ref 3.6–4.6)
Alkaline Phosphatase: 45 IU/L (ref 44–121)
BUN/Creatinine Ratio: 13 (ref 10–24)
BUN: 14 mg/dL (ref 10–36)
Bilirubin Total: 0.4 mg/dL (ref 0.0–1.2)
CO2: 22 mmol/L (ref 20–29)
Calcium: 8.5 mg/dL — ABNORMAL LOW (ref 8.6–10.2)
Chloride: 100 mmol/L (ref 96–106)
Creatinine, Ser: 1.12 mg/dL (ref 0.76–1.27)
Globulin, Total: 3 g/dL (ref 1.5–4.5)
Glucose: 108 mg/dL — ABNORMAL HIGH (ref 70–99)
Potassium: 3.7 mmol/L (ref 3.5–5.2)
Sodium: 137 mmol/L (ref 134–144)
Total Protein: 7.2 g/dL (ref 6.0–8.5)
eGFR: 60 mL/min/{1.73_m2} (ref >59)

## 2021-12-04 ENCOUNTER — Other Ambulatory Visit: Payer: Self-pay

## 2021-12-04 DIAGNOSIS — I10 Essential (primary) hypertension: Secondary | ICD-10-CM

## 2021-12-04 MED ORDER — AMLODIPINE BESYLATE 5 MG PO TABS: 5.0000 mg | ORAL_TABLET | Freq: Two times a day (BID) | ORAL | 1 refills | Status: DC

## 2021-12-09 ENCOUNTER — Other Ambulatory Visit: Payer: Self-pay | Admitting: Family Medicine

## 2021-12-09 DIAGNOSIS — N401 Enlarged prostate with lower urinary tract symptoms: Secondary | ICD-10-CM

## 2021-12-09 DIAGNOSIS — K219 Gastro-esophageal reflux disease without esophagitis: Secondary | ICD-10-CM

## 2021-12-09 DIAGNOSIS — E785 Hyperlipidemia, unspecified: Secondary | ICD-10-CM

## 2021-12-10 NOTE — Telephone Encounter (Signed)
Requested Prescriptions  Pending Prescriptions Disp Refills  . tamsulosin (FLOMAX) 0.4 MG CAPS capsule [Pharmacy Med Name: TAMSULOSIN HYDROCHLORIDE 0.4 MG Capsule] 90 capsule 1    Sig: TAKE [place]     Urology: Alpha-Adrenergic Blocker Failed - 12/09/2021  2:49 AM      Failed - PSA in normal range and within 360 days    Prostate Specific Ag, Serum  Date Value Ref Range Status  12/10/2020 2.5 0.0 - 4.0 ng/mL Final    Comment:    Roche ECLIA methodology. According to the American Urological Association, Serum PSA should decrease and remain at undetectable levels after radical prostatectomy. The AUA defines biochemical recurrence as an initial PSA value 0.2 ng/mL or greater followed by a subsequent confirmatory PSA value 0.2 ng/mL or greater. Values obtained with different assay methods or kits cannot be used interchangeably. Results cannot be interpreted as absolute evidence of the presence or absence of malignant disease.          Failed - Last BP in normal range    BP Readings from Last 1 Encounters:  12/01/21 (!) 172/80         Passed - Valid encounter within last 12 months    Recent Outpatient Visits          1 week ago Essential hypertension   [place], MD   3 months ago [facility] Juline Patch, MD   4 months ago Left testicular pain   [facility] Juline Patch, MD   6 months ago Essential hypertension   [place], MD   7 months ago UTI symptoms   [facility] Juline Patch, MD      Future Appointments            In 3 weeks Juline Patch, MD [facility], [place]   In 5 months Juline Patch, MD [facility], [place]           . omeprazole (PRILOSEC) 20 MG capsule [Pharmacy Med Name: OMEPRAZOLE 20 MG Capsule Delayed Release] 90 capsule 1    Sig: TAKE 1 CAPSULE EVERY DAY     Gastroenterology: Proton Pump Inhibitors Passed -  12/09/2021  2:49 AM      Passed - Valid encounter within last 12 months    Recent Outpatient Visits          1 week ago Essential hypertension   [facility] Juline Patch, MD   3 months ago [facility] Juline Patch, MD   4 months ago Left testicular pain   [facility] Juline Patch, MD   6 months ago Essential hypertension   [place], MD   7 months ago UTI symptoms   [facility] Juline Patch, MD      Future Appointments            In 3 weeks Juline Patch, MD [facility], [place]   In 5 months Juline Patch, MD [facility], [place]           . simvastatin (ZOCOR) 40 MG tablet [Pharmacy Med Name: SIMVASTATIN 40 MG Tablet] 90 tablet 1    Sig: TAKE 1 TABLET AT BEDTIME     Cardiovascular:  Antilipid - Statins Failed - 12/09/2021  2:49 AM      Failed -  Lipid Panel in normal range within the last 12 months    Cholesterol, Total  Date Value Ref Range Status  12/01/2021 206 (H) 100 - 199 mg/dL Final   LDL Chol Calc (NIH)  Date Value Ref Range Status  12/01/2021 109 (H) 0 - 99 mg/dL Final   HDL  Date Value Ref Range Status  12/01/2021 84 >39 mg/dL Final   Triglycerides  Date Value Ref Range Status  12/01/2021 72 0 - 149 mg/dL Final         Passed - Patient is not pregnant      Passed - Valid encounter within last 12 months    Recent Outpatient Visits          1 week ago Essential hypertension   [place], MD   3 months ago [facility] Juline Patch, MD   4 months ago Left testicular pain   [facility] Juline Patch, MD   6 months ago Essential hypertension   [place], MD   7 months ago UTI symptoms   [facility] Juline Patch, MD      Future Appointments            In 3 weeks Juline Patch, MD [facility], [place]   In 5 months Juline Patch, MD [facility], [place] did not receive recent refill.

## 2021-12-11 ENCOUNTER — Ambulatory Visit (INDEPENDENT_AMBULATORY_CARE_PROVIDER_SITE_OTHER): Payer: Medicare HMO | Admitting: Family Medicine

## 2021-12-11 ENCOUNTER — Encounter: Payer: Self-pay | Admitting: Family Medicine

## 2021-12-11 ENCOUNTER — Ambulatory Visit
Admission: RE | Admit: 2021-12-11 | Discharge: 2021-12-11 | Disposition: A | Discharge status: Home / Self Care | Payer: Medicare HMO | Attending: Family Medicine | Admitting: Family Medicine

## 2021-12-11 ENCOUNTER — Ambulatory Visit
Admission: RE | Admit: 2021-12-11 | Discharge: 2021-12-11 | Disposition: A | Discharge status: Home / Self Care | Payer: Medicare HMO | Source: Ambulatory Visit | Attending: Family Medicine | Admitting: Family Medicine

## 2021-12-11 VITALS — BP 138/78 | HR 66 | Ht 70.0 in | Wt 141.0 lb

## 2021-12-11 DIAGNOSIS — G8929 Other chronic pain: Secondary | ICD-10-CM | POA: Insufficient documentation

## 2021-12-11 DIAGNOSIS — M25561 Pain in right knee: Secondary | ICD-10-CM

## 2021-12-11 DIAGNOSIS — M1711 Unilateral primary osteoarthritis, right knee: Secondary | ICD-10-CM | POA: Diagnosis not present

## 2021-12-11 DIAGNOSIS — M25461 Effusion, right knee: Secondary | ICD-10-CM | POA: Diagnosis not present

## 2021-12-11 MED ORDER — DICLOFENAC SODIUM 1 % EX GEL: 2.0000 g | Freq: Four times a day (QID) | CUTANEOUS | 0 refills | Status: AC | PRN
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
## 2021-12-29 DIAGNOSIS — R001 Bradycardia, unspecified: Secondary | ICD-10-CM | POA: Diagnosis not present

## 2022-01-01 ENCOUNTER — Ambulatory Visit: Payer: Medicare HMO | Admitting: Family Medicine

## 2022-01-08 ENCOUNTER — Ambulatory Visit: Payer: Self-pay | Admitting: *Deleted

## 2022-01-08 ENCOUNTER — Telehealth: Payer: Self-pay

## 2022-01-08 NOTE — Telephone Encounter (Signed)
  Chief Complaint: Not sure if he took his morning pills or not.  Seeking advice what to do.   He thinks he did not take them because the bottle of water he had with him to take the pills nothing had been drunk from it.   His pills were wrapped in a piece of tin foil in his pocket.  He could not find the pills when it came time to take them.  He suspects he did not take them.  Niece Marliss Coots was on the phone with him.   He is hard of hearing so has difficulty with using the phone. Symptoms: No symptoms Frequency: N/A Pertinent Negatives: Patient denies feeling any different feelings Disposition: '[]'$ ED /'[]'$ Urgent Care (no appt availability in office) / '[]'$ Appointment(In office/virtual)/ '[]'$  [place] [facility]/ '[]'$ Home Care/ '[]'$ Refused Recommended Disposition /'[]'$ [place] [facility]/ '[x]'$  Follow-up with PCP Additional Notes: Message sent to Dr. Otilio Miu seeking further advice.   Marliss Coots can be reached at [phone].   She is not on the [facility] but is listed as a contact and was on the line with Mr. Wise for this call.   You can call her since Mr. Gerry can't hear on the phone very well.

## 2022-01-08 NOTE — Telephone Encounter (Signed)
Reason for Disposition  [1] DOUBLE DOSE (an extra dose or lesser amount) of prescription drug AND [2] NO symptoms  (Exception: A double dose of antibiotics.)  Answer Assessment - Initial Assessment Questions 1. NAME of MEDICINE: "What medicine(s) are you calling about?"     He doesn't know if he took his medications this morning or not.   I didn't find my pills.   He did not drink any water out of his bottle so makes me think he did not take them.   Niece on phone too.    He took his pills with him wrapped in a tin foil to take later.  I took a bottle of water with him and he has not drank anything out of the bottle of water.    2. QUESTION: "What is your question?" (e.g., double dose of medicine, side effect)     Should I take more or not? 3. PRESCRIBER: "Who prescribed the medicine?" Reason: if prescribed by specialist, call should be referred to that group.     Dr. Ronnald Ramp 4. SYMPTOMS: "Do you have any symptoms?" If Yes, ask: "What symptoms are you having?"  "How bad are the symptoms (e.g., mild, moderate, severe)     No 5. PREGNANCY:  "Is there any chance that you are pregnant?" "When was your last menstrual period?"     N/A  Protocols used: Medication Question Call-A-AH

## 2022-01-08 NOTE — Telephone Encounter (Signed)
I returned the niece's call concerning pt not taking his med. Due to the time of day, I advised her to give him Amlodipine '5mg'$  now and only take a half of one tonight. Losartan- he can go ahead and take now as well. Start in the morning with normal dosing on meds

## 2022-01-12 ENCOUNTER — Encounter: Payer: Self-pay | Admitting: Family Medicine

## 2022-01-12 ENCOUNTER — Ambulatory Visit (INDEPENDENT_AMBULATORY_CARE_PROVIDER_SITE_OTHER): Payer: Medicare HMO | Admitting: Family Medicine

## 2022-01-12 DIAGNOSIS — M1711 Unilateral primary osteoarthritis, right knee: Secondary | ICD-10-CM

## 2022-01-12 DIAGNOSIS — M25561 Pain in right knee: Secondary | ICD-10-CM | POA: Diagnosis not present

## 2022-01-12 DIAGNOSIS — H903 Sensorineural hearing loss, bilateral: Secondary | ICD-10-CM | POA: Diagnosis not present

## 2022-01-12 DIAGNOSIS — H524 Presbyopia: Secondary | ICD-10-CM | POA: Diagnosis not present

## 2022-01-12 DIAGNOSIS — H6123 Impacted cerumen, bilateral: Secondary | ICD-10-CM | POA: Diagnosis not present

## 2022-01-12 DIAGNOSIS — H40153 Residual stage of open-angle glaucoma, bilateral: Secondary | ICD-10-CM | POA: Diagnosis not present

## 2022-01-12 NOTE — Progress Notes (Signed)
     Primary Care / Sports Medicine Office Visit  Patient Information:  Patient ID: Eric [name], male DOB: 1923/08/14 Age: 88 y.o. MRN: [id number]   Eric [name] is a pleasant 88 y.o. male presenting with the following:  Chief Complaint  Patient presents with   Chronic pain of right knee    Feels the same    Vitals:   01/12/22 0943  BP: (!) 142/82  Pulse: 88   Vitals:   01/12/22 0943  Weight: 141 lb 3.2 oz (64 kg)  Height: '5\' 10"'$  (1.778 m)   Body mass index is 20.26 kg/m.  No results found.   Independent interpretation of notes and tests performed by another provider:   Independent interpretation of recent right knee x-ray reveals tricompartmental osteoarthritis with severe lateral tibiofemoral narrowing and joint space loss, secondarily to the patellofemoral compartment, there is valgus malalignment noted.  Procedures performed:   None  Pertinent History, Exam, Impression, and Recommendations:   Problem List Items Addressed This Visit       Musculoskeletal and Integument   Primary osteoarthritis of right knee    Chronic condition still symptomatic, did trial Volatren gel without response. No new symptoms. Pain reported as primarily posterior knee without radiation, aggravated with weightbearing.   His x-rays show significant lateral OA, secondarily to the patellofemoral compartment. Given persistent symptoms we reviewed treatments and he has opted for viscosupplementation, we will coordinate authorization and have him return, can continue OTC medications over interim.        Orders & Medications No orders of the defined types were placed in this encounter.  No orders of the defined types were placed in this encounter.    Return if symptoms worsen or fail to improve.     [name], MD   Primary Care Sports Medicine [place]

## 2022-01-12 NOTE — Assessment & Plan Note (Signed)
Chronic condition still symptomatic, did trial Volatren gel without response. No new symptoms. Pain reported as primarily posterior knee without radiation, aggravated with weightbearing.   His x-rays show significant lateral OA, secondarily to the patellofemoral compartment. Given persistent symptoms we reviewed treatments and he has opted for viscosupplementation, we will coordinate authorization and have him return, can continue OTC medications over interim.

## 2022-01-21 ENCOUNTER — Ambulatory Visit (INDEPENDENT_AMBULATORY_CARE_PROVIDER_SITE_OTHER): Payer: Medicare HMO

## 2022-01-21 ENCOUNTER — Ambulatory Visit: Payer: Self-pay

## 2022-01-21 ENCOUNTER — Ambulatory Visit
Admission: EM | Admit: 2022-01-21 | Discharge: 2022-01-21 | Disposition: A | Discharge status: Home / Self Care | Payer: Medicare HMO | Attending: Emergency Medicine | Admitting: Emergency Medicine

## 2022-01-21 ENCOUNTER — Other Ambulatory Visit: Payer: Self-pay

## 2022-01-21 DIAGNOSIS — G8929 Other chronic pain: Secondary | ICD-10-CM

## 2022-01-21 DIAGNOSIS — S8991XA Unspecified injury of right lower leg, initial encounter: Secondary | ICD-10-CM | POA: Diagnosis not present

## 2022-01-21 DIAGNOSIS — S8001XA Contusion of right knee, initial encounter: Secondary | ICD-10-CM

## 2022-01-21 DIAGNOSIS — M1711 Unilateral primary osteoarthritis, right knee: Secondary | ICD-10-CM | POA: Diagnosis not present

## 2022-01-21 DIAGNOSIS — M25561 Pain in right knee: Secondary | ICD-10-CM

## 2022-01-21 DIAGNOSIS — M25571 Pain in right ankle and joints of right foot: Secondary | ICD-10-CM

## 2022-01-21 DIAGNOSIS — M25461 Effusion, right knee: Secondary | ICD-10-CM | POA: Diagnosis not present

## 2022-01-21 DIAGNOSIS — S99911A Unspecified injury of right ankle, initial encounter: Secondary | ICD-10-CM | POA: Diagnosis not present
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
## 2022-01-21 NOTE — Discharge Instructions (Addendum)
Your x-rays did not show any broken bones in either your knee or your ankle.  There is no arthritis noted on your ankle films but there is significant arthritis in your knee.  Your ankle pain may actually be referred from your knee.  Take over-the-counter Tylenol according to the package instructions as needed for your pain.  You may apply ice or moist heat to your knee and ankle for 20 minutes at a time 2-3 times a day as needed for pain.  Do not apply the ice directly to your skin, always make sure there is a cloth between the ice and your skin to prevent skin damage.  You may also use over-the-counter lidocaine patches according to the package instructions as needed for pain.  If your symptoms continue, or they worsen, return for reevaluation, follow-up with your primary care provider, or follow-up with orthopedics.

## 2022-03-29 DIAGNOSIS — R001 Bradycardia, unspecified: Secondary | ICD-10-CM | POA: Diagnosis not present

## 2022-03-29 DIAGNOSIS — I10 Essential (primary) hypertension: Secondary | ICD-10-CM | POA: Diagnosis not present

## 2022-03-29 DIAGNOSIS — E782 Mixed hyperlipidemia: Secondary | ICD-10-CM | POA: Diagnosis not present

## 2022-05-13 DIAGNOSIS — H40153 Residual stage of open-angle glaucoma, bilateral: Secondary | ICD-10-CM | POA: Diagnosis not present

## 2022-05-21 DIAGNOSIS — R9431 Abnormal electrocardiogram [ECG] [EKG]: Secondary | ICD-10-CM | POA: Diagnosis not present

## 2022-05-21 DIAGNOSIS — Z5329 Procedure and treatment not carried out because of patient's decision for other reasons: Secondary | ICD-10-CM | POA: Diagnosis not present

## 2022-05-21 DIAGNOSIS — R079 Chest pain, unspecified: Secondary | ICD-10-CM | POA: Diagnosis not present

## 2022-05-27 DIAGNOSIS — I5022 Chronic systolic (congestive) heart failure: Secondary | ICD-10-CM | POA: Diagnosis not present

## 2022-05-27 DIAGNOSIS — Z950 Presence of cardiac pacemaker: Secondary | ICD-10-CM | POA: Diagnosis not present

## 2022-05-27 DIAGNOSIS — R001 Bradycardia, unspecified: Secondary | ICD-10-CM | POA: Diagnosis not present

## 2022-05-27 DIAGNOSIS — R0789 Other chest pain: Secondary | ICD-10-CM | POA: Diagnosis not present

## 2022-05-27 DIAGNOSIS — E782 Mixed hyperlipidemia: Secondary | ICD-10-CM | POA: Diagnosis not present

## 2022-05-27 DIAGNOSIS — I10 Essential (primary) hypertension: Secondary | ICD-10-CM | POA: Diagnosis not present

## 2022-06-01 ENCOUNTER — Telehealth: Payer: Self-pay

## 2022-06-01 NOTE — Telephone Encounter (Signed)
Called and left message on daughter's vm concerning the paperwork for FMLA she dropped off. I explained we couldn't do an intermittent or open FMLA for her. We typically don't see her dad but twice a year for med refills. She may ask cardio if they can assist since he is needing more care or office visits.Paperwork left up front in box for pick up

## 2022-06-03 ENCOUNTER — Ambulatory Visit: Payer: Medicare HMO | Admitting: Family Medicine

## 2022-06-10 ENCOUNTER — Telehealth: Payer: Self-pay | Admitting: Family Medicine

## 2022-06-10 ENCOUNTER — Other Ambulatory Visit: Payer: Self-pay

## 2022-06-10 DIAGNOSIS — I10 Essential (primary) hypertension: Secondary | ICD-10-CM

## 2022-06-10 MED ORDER — AMLODIPINE BESYLATE 5 MG PO TABS: 5.0000 mg | ORAL_TABLET | Freq: Two times a day (BID) | ORAL | 0 refills | Status: DC

## 2022-06-10 NOTE — Telephone Encounter (Signed)
Copied from [place] [phone]. Topic: Medicare AWV >> Jun 10, 2022 11:45 AM Devoria Glassing wrote: Reason for CRM: LVM 06/10/22 to r/s AWV appt. New appt date 07/08/22 '@8'$ :30am. Please confirm appt date change- Please call [phone] to confirm or reschedule.  Thanks khc

## 2022-06-23 ENCOUNTER — Encounter: Payer: Self-pay | Admitting: Family Medicine

## 2022-06-23 ENCOUNTER — Ambulatory Visit (INDEPENDENT_AMBULATORY_CARE_PROVIDER_SITE_OTHER): Payer: Medicare HMO | Admitting: Family Medicine

## 2022-06-23 VITALS — BP 126/60 | HR 80 | Ht 70.0 in | Wt 144.0 lb

## 2022-06-23 DIAGNOSIS — N401 Enlarged prostate with lower urinary tract symptoms: Secondary | ICD-10-CM

## 2022-06-23 DIAGNOSIS — J301 Allergic rhinitis due to pollen: Secondary | ICD-10-CM

## 2022-06-23 DIAGNOSIS — K219 Gastro-esophageal reflux disease without esophagitis: Secondary | ICD-10-CM | POA: Diagnosis not present

## 2022-06-23 DIAGNOSIS — E785 Hyperlipidemia, unspecified: Secondary | ICD-10-CM

## 2022-06-23 DIAGNOSIS — Z23 Encounter for immunization: Secondary | ICD-10-CM

## 2022-06-23 DIAGNOSIS — I10 Essential (primary) hypertension: Secondary | ICD-10-CM | POA: Diagnosis not present

## 2022-06-23 MED ORDER — TAMSULOSIN HCL 0.4 MG PO CAPS: 0.4000 mg | ORAL_CAPSULE | Freq: Every day | ORAL | 1 refills | Status: DC

## 2022-06-23 MED ORDER — FLUTICASONE PROPIONATE 50 MCG/ACT NA SUSP: NASAL | 1 refills | Status: DC

## 2022-06-23 MED ORDER — AMLODIPINE BESYLATE 5 MG PO TABS: 5.0000 mg | ORAL_TABLET | Freq: Two times a day (BID) | ORAL | 1 refills | Status: DC

## 2022-06-23 MED ORDER — OMEPRAZOLE 20 MG PO CPDR: 20.0000 mg | DELAYED_RELEASE_CAPSULE | Freq: Every day | ORAL | 1 refills | Status: DC

## 2022-06-23 MED ORDER — SIMVASTATIN 40 MG PO TABS: 40.0000 mg | ORAL_TABLET | Freq: Every day | ORAL | 1 refills | Status: DC

## 2022-06-23 MED ORDER — LOSARTAN POTASSIUM 100 MG PO TABS: 100.0000 mg | ORAL_TABLET | Freq: Every day | ORAL | 1 refills | Status: DC

## 2022-06-23 NOTE — Progress Notes (Signed)
Date:  06/23/2022   Name:  Eric [name]   DOB:  [birth year]   MRN:  [id number]   Chief Complaint: Hypertension, Allergic Rhinitis , Gastroesophageal Reflux, Hyperlipidemia, Benign Prostatic Hypertrophy, and Flu Vaccine  Hypertension This is a chronic problem. The current episode started more than 1 year ago. The problem has been gradually improving since onset. The problem is controlled. Pertinent negatives include no anxiety, blurred vision, chest pain, headaches, malaise/fatigue, neck pain, orthopnea, palpitations, peripheral edema, PND, shortness of breath or sweats. There are no associated agents to hypertension. Risk factors for coronary artery disease include dyslipidemia. Past treatments include angiotensin blockers and calcium channel blockers. The current treatment provides moderate improvement. There are no compliance problems.  There is no history of chronic renal disease.  Gastroesophageal Reflux He reports no abdominal pain, no belching, no chest pain, no coughing, no dysphagia, no heartburn, no hoarse voice, no nausea, no sore throat or no wheezing. The problem has been gradually improving. The symptoms are aggravated by certain foods. He has tried a PPI for the symptoms. The treatment provided moderate relief.  Hyperlipidemia This is a chronic problem. The current episode started more than 1 month ago. The problem is controlled. Recent lipid tests were reviewed and are normal. He has no history of chronic renal disease. Pertinent negatives include no chest pain, myalgias or shortness of breath. Current antihyperlipidemic treatment includes statins. The current treatment provides moderate improvement of lipids. There are no compliance problems.   Benign Prostatic Hypertrophy This is a chronic problem. The current episode started more than 1 year ago. Irritative symptoms do not include frequency or urgency. Pertinent negatives include no chills, dysuria, hematuria or nausea. Past  treatments include tamsulosin. The treatment provided mild relief.    Lab Results  Component Value Date   NA 137 12/01/2021   K 3.7 12/01/2021   CO2 22 12/01/2021   GLUCOSE 108 (H) 12/01/2021   BUN 14 12/01/2021   CREATININE 1.12 12/01/2021   CALCIUM 8.5 (L) 12/01/2021   EGFR 60 12/01/2021   GFRNONAA 55 (L) 09/03/2019   Lab Results  Component Value Date   CHOL 206 (H) 12/01/2021   HDL 84 12/01/2021   LDLCALC 109 (H) 12/01/2021   TRIG 72 12/01/2021   CHOLHDL 2.5 08/24/2017   No results found for: "TSH" No results found for: "HGBA1C" Lab Results  Component Value Date   WBC 3.8 (L) 12/20/2018   HGB 11.4 (L) 12/20/2018   HCT 35.1 (L) 12/20/2018   MCV 91.2 12/20/2018   PLT 197 12/20/2018   Lab Results  Component Value Date   ALT 8 12/01/2021   AST 17 12/01/2021   ALKPHOS 45 12/01/2021   BILITOT 0.4 12/01/2021   No results found for: "25OHVITD2", "25OHVITD3", "VD25OH"   Review of Systems  Constitutional:  Negative for chills, fever and malaise/fatigue.  HENT:  Negative for drooling, ear discharge, ear pain, hoarse voice and sore throat.   Eyes:  Negative for blurred vision.  Respiratory:  Negative for cough, shortness of breath and wheezing.   Cardiovascular:  Negative for chest pain, palpitations, orthopnea, leg swelling and PND.  Gastrointestinal:  Negative for abdominal pain, blood in stool, constipation, diarrhea, dysphagia, heartburn and nausea.  Endocrine: Negative for polydipsia.  Genitourinary:  Negative for dysuria, frequency, hematuria and urgency.  Musculoskeletal:  Negative for back pain, myalgias and neck pain.  Skin:  Negative for rash.  Allergic/Immunologic: Negative for environmental allergies.  Neurological:  Negative for dizziness and headaches.  Hematological:  Does not bruise/bleed easily.  Psychiatric/Behavioral:  Negative for suicidal ideas. The patient is not nervous/anxious.     Patient Active Problem List   Diagnosis Date Noted   Primary  osteoarthritis of right knee 12/11/2021   Essential hypertension 10/22/2015   Hyperlipidemia 10/22/2015   Benign prostatic hyperplasia 10/22/2015    No Known Allergies  Past Surgical History:  Procedure Laterality Date   EYE SURGERY     cataract extraction   HERNIA REPAIR Left 2014   inguinal hernia   PACEMAKER INSERTION Left 12/27/2017   Procedure: INSERTION PACEMAKER-INITIAL DUAL CHAMBER;  Surgeon: Isaias Cowman, MD;  Location: [facility] ORS;  Service: Cardiovascular;  Laterality: Left;    Social History   Tobacco Use   Smoking status: Never   Smokeless tobacco: Never  Vaping Use   Vaping Use: Never used  Substance Use Topics   Alcohol use: No   Drug use: No     Medication list has been reviewed and updated.  Current Meds  Medication Sig   acetaminophen (TYLENOL) 325 MG tablet Take 650 mg by mouth every 6 (six) hours as needed.   amLODipine (NORVASC) 5 MG tablet Take 1 tablet (5 mg total) by mouth 2 (two) times daily.   brimonidine (ALPHAGAN) 0.2 % ophthalmic solution    diclofenac Sodium (VOLTAREN) 1 % GEL Apply 2 g topically 4 (four) times daily as needed. To affected joint.   fluticasone (FLONASE) 50 MCG/ACT nasal spray USE 1 SPRAY IN EACH NOSTRIL EVERY DAY (NEED MD APPOINTMENT)   latanoprost (XALATAN) 0.005 % ophthalmic solution Place 1 drop into both eyes at bedtime.   losartan (COZAAR) 100 MG tablet Take 1 tablet (100 mg total) by mouth daily.   omeprazole (PRILOSEC) 20 MG capsule TAKE 1 CAPSULE EVERY DAY   simvastatin (ZOCOR) 40 MG tablet TAKE 1 TABLET AT BEDTIME   tamsulosin (FLOMAX) 0.4 MG CAPS capsule TAKE 1 CAPSULE EVERY DAY   timolol (TIMOPTIC) 0.5 % ophthalmic solution SMARTSIG:In Eye(s)       06/23/2022   10:09 AM 01/12/2022    9:46 AM 12/11/2021    9:45 AM 12/01/2021    9:11 AM  GAD 7 : Generalized Anxiety Score  Nervous, Anxious, on Edge 0 0 0 0  Control/stop worrying 0 0 0 0  Worry too much - different things 0 0 0 0  Trouble relaxing 0 0 0 0   Restless 0 0 0 0  Easily annoyed or irritable 0 0 0 0  Afraid - awful might happen 0 0 0 0  Total GAD 7 Score 0 0 0 0  Anxiety Difficulty Not difficult at all Not difficult at all Not difficult at all Not difficult at all       06/23/2022   10:09 AM 01/12/2022    9:46 AM 12/11/2021    9:45 AM  Depression screen PHQ 2/9  Decreased Interest 0 0 0  Down, Depressed, Hopeless 0 0 0  PHQ - 2 Score 0 0 0  Altered sleeping 0 0 0  Tired, decreased energy 0 0 0  Change in appetite 0 0 0  Feeling bad or failure about yourself  0 0 0  Trouble concentrating 0 0 0  Moving slowly or fidgety/restless 0 0 0  Suicidal thoughts 0 0 0  PHQ-9 Score 0 0 0  Difficult doing work/chores Not difficult at all  Not difficult at all    BP Readings from Last 3 Encounters:  06/23/22 126/60  01/21/22 (!) 174/86  01/12/22 (!) 142/82    Physical Exam Vitals and nursing note reviewed.  HENT:     Right Ear: Tympanic membrane and ear canal normal.     Left Ear: Tympanic membrane and ear canal normal.     Mouth/Throat:     Mouth: Mucous membranes are moist.  Eyes:     Pupils: Pupils are equal, round, and reactive to light.  Cardiovascular:     Heart sounds: S1 normal and S2 normal. No murmur heard.    No systolic murmur is present.     No diastolic murmur is present.     No friction rub. No gallop. No S3 or S4 sounds.  Pulmonary:     Breath sounds: No wheezing, rhonchi or rales.  Abdominal:     General: Abdomen is flat.     Tenderness: There is no abdominal tenderness.  Musculoskeletal:     Right lower leg: No edema.     Left lower leg: No edema.  Skin:    Capillary Refill: Capillary refill takes less than 2 seconds.  Neurological:     Mental Status: He is alert.     Wt Readings from Last 3 Encounters:  06/23/22 144 lb (65.3 kg)  01/21/22 141 lb 4.3 oz (64.1 kg)  01/12/22 141 lb 3.2 oz (64 kg)    BP 126/60   Pulse 80   Ht '5\' 10"'$  (1.778 m)   Wt 144 lb (65.3 kg)   SpO2 96%   BMI 20.66  kg/m   Assessment and Plan:  1. Essential hypertension Chronic.  Controlled.  Stable.  Blood pressure 126/60.  Continue amlodipine 5 mg once a day and losartan 100 mg once a day.  Review of previous renal function panel acceptable we will recheck in 6 months. - amLODipine (NORVASC) 5 MG tablet; Take 1 tablet (5 mg total) by mouth 2 (two) times daily.  Dispense: 90 tablet; Refill: 1 - losartan (COZAAR) 100 MG tablet; Take 1 tablet (100 mg total) by mouth daily.  Dispense: 90 tablet; Refill: 1  2. Seasonal allergic rhinitis due to pollen .  Controlled.  Stable.  Flonase on 1 spray each nostril daily. - fluticasone (FLONASE) 50 MCG/ACT nasal spray; USE 1 SPRAY IN EACH NOSTRIL EVERY DAY (NEED MD APPOINTMENT)  Dispense: 32 g; Refill: 1  3. Gastroesophageal reflux disease Chronic.  Controlled.  Stable.  Continue omeprazole 20 mg once a day. - omeprazole (PRILOSEC) 20 MG capsule; Take 1 capsule (20 mg total) by mouth daily.  Dispense: 90 capsule; Refill: 1  4. Hyperlipidemia, unspecified hyperlipidemia type Chronic.  Controlled.  Stable.  Continue simvastatin 40 mg nightly. - simvastatin (ZOCOR) 40 MG tablet; Take 1 tablet (40 mg total) by mouth at bedtime.  Dispense: 90 tablet; Refill: 1  5. Benign prostatic hyperplasia with lower urinary tract symptoms, symptom details unspecified Chronic.  Controlled.  Stable.  Continue tamsulosin 0.4 mg once a day.  Asymptomatic and is tolerating medication well. - tamsulosin (FLOMAX) 0.4 MG CAPS capsule; Take 1 capsule (0.4 mg total) by mouth daily.  Dispense: 90 capsule; Refill: 1  6. Need for immunization against influenza Discussed and administered. - Flu Vaccine QUAD High Dose(Fluad)    Otilio Miu, MD

## 2022-07-05 ENCOUNTER — Ambulatory Visit: Payer: Medicare HMO

## 2022-07-08 ENCOUNTER — Ambulatory Visit (INDEPENDENT_AMBULATORY_CARE_PROVIDER_SITE_OTHER): Payer: Medicare HMO

## 2022-07-08 VITALS — Ht 70.0 in | Wt 144.0 lb

## 2022-07-08 DIAGNOSIS — Z0000 Encounter for general adult medical examination without abnormal findings: Secondary | ICD-10-CM

## 2022-07-08 NOTE — Patient Instructions (Signed)
Eric [name] , Thank you for taking time to come for your Medicare Wellness Visit. I appreciate your ongoing commitment to your health goals. Please review the following plan we discussed and let me know if I can assist you in the future.   These are the goals we discussed:  Goals      Patient Stated     No goals        This is a list of the screening recommended for you and due dates:  Health Maintenance  Topic Date Due   COVID-19 Vaccine (3 - 2023-24 season) 07/09/2022*   Zoster (Shingles) Vaccine (1 of 2) 09/21/2022*   Pneumonia Vaccine (2 of 2 - PPSV23 or PCV20) 06/24/2023*   Medicare Annual Wellness Visit  07/09/2023   Flu Shot  Completed   HPV Vaccine  Aged Out   DTaP/Tdap/Td vaccine  Discontinued  *Topic was postponed. The date shown is not the original due date.    Advanced directives: no  Conditions/risks identified: none  Next appointment: Follow up in one year for your annual wellness visit. 07/13/23 @ 1:30 pm by phone w/ daughter Celina  Preventive Care 5 Years and Older, Male  Preventive care refers to lifestyle choices and visits with your health care provider that can promote health and wellness. What does preventive care include? A yearly physical exam. This is also called an annual well check. Dental exams once or twice a year. Routine eye exams. Ask your health care provider how often you should have your eyes checked. Personal lifestyle choices, including: Daily care of your teeth and gums. Regular physical activity. Eating a healthy diet. Avoiding tobacco and drug use. Limiting alcohol use. Practicing safe sex. Taking low doses of aspirin every day. Taking vitamin and mineral supplements as recommended by your health care provider. What happens during an annual well check? The services and screenings done by your health care provider during your annual well check will depend on your age, overall health, lifestyle risk factors, and family history of  disease. Counseling  Your health care provider may ask you questions about your: Alcohol use. Tobacco use. Drug use. Emotional well-being. Home and relationship well-being. Sexual activity. Eating habits. History of falls. Memory and ability to understand (cognition). Work and work Statistician. Screening  You may have the following tests or measurements: Height, weight, and BMI. Blood pressure. Lipid and cholesterol levels. These may be checked every 5 years, or more frequently if you are over 76 years old. Skin check. Lung cancer screening. You may have this screening every year starting at age 50 if you have a 30-pack-year history of smoking and currently smoke or have quit within the past 15 years. Fecal occult blood test (FOBT) of the stool. You may have this test every year starting at age 57. Flexible sigmoidoscopy or colonoscopy. You may have a sigmoidoscopy every 5 years or a colonoscopy every 10 years starting at age 30. Prostate cancer screening. Recommendations will vary depending on your family history and other risks. Hepatitis C blood test. Hepatitis B blood test. Sexually transmitted disease (STD) testing. Diabetes screening. This is done by checking your blood sugar (glucose) after you have not eaten for a while (fasting). You may have this done every 1-3 years. Abdominal aortic aneurysm (AAA) screening. You may need this if you are a current or former smoker. Osteoporosis. You may be screened starting at age 32 if you are at high risk. Talk with your health care provider about your test results,  treatment options, and if necessary, the need for more tests. Vaccines  Your health care provider may recommend certain vaccines, such as: Influenza vaccine. This is recommended every year. Tetanus, diphtheria, and acellular pertussis (Tdap, Td) vaccine. You may need a Td booster every 10 years. Zoster vaccine. You may need this after age 1. Pneumococcal 13-valent  conjugate (PCV13) vaccine. One dose is recommended after age 67. Pneumococcal polysaccharide (PPSV23) vaccine. One dose is recommended after age 5. Talk to your health care provider about which screenings and vaccines you need and how often you need them. This information is not intended to replace advice given to you by your health care provider. Make sure you discuss any questions you have with your health care provider. Document Released: 06/06/2015 Document Revised: 01/28/2016 Document Reviewed: 03/11/2015 Elsevier Interactive Patient Education  2017 [employer] Prevention in the Home Falls can cause injuries. They can happen to people of all ages. There are many things you can do to make your home safe and to help prevent falls. What can I do on the outside of my home? Regularly fix the edges of walkways and driveways and fix any cracks. Remove anything that might make you trip as you walk through a door, such as a raised step or threshold. Trim any bushes or trees on the path to your home. Use bright outdoor lighting. Clear any walking paths of anything that might make someone trip, such as rocks or tools. Regularly check to see if handrails are loose or broken. Make sure that both sides of any steps have handrails. Any raised decks and porches should have guardrails on the edges. Have any leaves, snow, or ice cleared regularly. Use sand or salt on walking paths during winter. Clean up any spills in your garage right away. This includes oil or grease spills. What can I do in the bathroom? Use night lights. Install grab bars by the toilet and in the tub and shower. Do not use towel bars as grab bars. Use non-skid mats or decals in the tub or shower. If you need to sit down in the shower, use a plastic, non-slip stool. Keep the floor dry. Clean up any water that spills on the floor as soon as it happens. Remove soap buildup in the tub or shower regularly. Attach bath mats  securely with double-sided non-slip rug tape. Do not have throw rugs and other things on the floor that can make you trip. What can I do in the bedroom? Use night lights. Make sure that you have a light by your bed that is easy to reach. Do not use any sheets or blankets that are too big for your bed. They should not hang down onto the floor. Have a firm chair that has side arms. You can use this for support while you get dressed. Do not have throw rugs and other things on the floor that can make you trip. What can I do in the kitchen? Clean up any spills right away. Avoid walking on wet floors. Keep items that you use a lot in easy-to-reach places. If you need to reach something above you, use a strong step stool that has a grab bar. Keep electrical cords out of the way. Do not use floor polish or wax that makes floors slippery. If you must use wax, use non-skid floor wax. Do not have throw rugs and other things on the floor that can make you trip. What can I do with my stairs? Do  not leave any items on the stairs. Make sure that there are handrails on both sides of the stairs and use them. Fix handrails that are broken or loose. Make sure that handrails are as long as the stairways. Check any carpeting to make sure that it is firmly attached to the stairs. Fix any carpet that is loose or worn. Avoid having throw rugs at the top or bottom of the stairs. If you do have throw rugs, attach them to the floor with carpet tape. Make sure that you have a light switch at the top of the stairs and the bottom of the stairs. If you do not have them, ask someone to add them for you. What else can I do to help prevent falls? Wear shoes that: Do not have high heels. Have rubber bottoms. Are comfortable and fit you well. Are closed at the toe. Do not wear sandals. If you use a stepladder: Make sure that it is fully opened. Do not climb a closed stepladder. Make sure that both sides of the stepladder  are locked into place. Ask someone to hold it for you, if possible. Clearly mark and make sure that you can see: Any grab bars or handrails. First and last steps. Where the edge of each step is. Use tools that help you move around (mobility aids) if they are needed. These include: Canes. Walkers. Scooters. Crutches. Turn on the lights when you go into a dark area. Replace any light bulbs as soon as they burn out. Set up your furniture so you have a clear path. Avoid moving your furniture around. If any of your floors are uneven, fix them. If there are any pets around you, be aware of where they are. Review your medicines with your doctor. Some medicines can make you feel dizzy. This can increase your chance of falling. Ask your doctor what other things that you can do to help prevent falls. This information is not intended to replace advice given to you by your health care provider. Make sure you discuss any questions you have with your health care provider. Document Released: 03/06/2009 Document Revised: 10/16/2015 Document Reviewed: 06/14/2014 Elsevier Interactive Patient Education  2017 [employer].

## 2022-07-08 NOTE — Progress Notes (Signed)
I connected with  [place] on 07/08/22 by a audio enabled telemedicine application and verified that I am speaking with the correct person using two identifiers. Spoke w/ Celina his daughter  Patient Location: Home  Provider Location: Office/Clinic  I discussed the limitations of evaluation and management by telemedicine. The patient expressed understanding and agreed to proceed.  Subjective:   St Makii Arcidiacono is a 89 y.o. male who presents for Medicare Annual/Subsequent preventive examination.  Review of Systems     Cardiac Risk Factors include: advanced age (>78mn, >>61women);male gender;dyslipidemia;hypertension     Objective:    There were no vitals filed for this visit. There is no height or weight on file to calculate BMI.     07/08/2022    8:42 AM 01/21/2022   12:37 PM 07/21/2021    3:37 PM 07/21/2021    3:00 PM 07/01/2021    8:10 AM 02/04/2020    9:04 AM 12/20/2018   12:14 PM  Advanced Directives  Does Patient Have a Medical Advance Directive? Yes Yes Yes Yes Yes Yes Yes  Type of AParamedicof AEurekaLiving will Healthcare Power of AChaumontof ANewhalenLiving will HNichollsLiving will HRafael GonzalezLiving will Living will  Does patient want to make changes to medical advance directive? No - Patient declined  No - Patient declined No - Patient declined     Copy of HWoodmanin Chart? Yes - validated most recent copy scanned in chart (See row information)  No - copy requested No - copy requested Yes - validated most recent copy scanned in chart (See row information) Yes - validated most recent copy scanned in chart (See row information)   Would patient like information on creating a medical advance directive?   No - Patient declined No - Patient declined       Current Medications (verified) Outpatient Encounter Medications as of 07/08/2022   Medication Sig   acetaminophen (TYLENOL) 325 MG tablet Take 650 mg by mouth every 6 (six) hours as needed.   amLODipine (NORVASC) 5 MG tablet Take 1 tablet (5 mg total) by mouth 2 (two) times daily.   brimonidine (ALPHAGAN) 0.2 % ophthalmic solution    diclofenac Sodium (VOLTAREN) 1 % GEL Apply 2 g topically 4 (four) times daily as needed. To affected joint.   fluticasone (FLONASE) 50 MCG/ACT nasal spray USE 1 SPRAY IN EACH NOSTRIL EVERY DAY (NEED MD APPOINTMENT)   latanoprost (XALATAN) 0.005 % ophthalmic solution Place 1 drop into both eyes at bedtime.   losartan (COZAAR) 100 MG tablet Take 1 tablet (100 mg total) by mouth daily.   omeprazole (PRILOSEC) 20 MG capsule Take 1 capsule (20 mg total) by mouth daily.   simvastatin (ZOCOR) 40 MG tablet Take 1 tablet (40 mg total) by mouth at bedtime.   tamsulosin (FLOMAX) 0.4 MG CAPS capsule Take 1 capsule (0.4 mg total) by mouth daily.   timolol (TIMOPTIC) 0.5 % ophthalmic solution SMARTSIG:In Eye(s)   traMADol (ULTRAM) 50 MG tablet Take by mouth. (Patient not taking: Reported on 06/23/2022)   No facility-administered encounter medications on file as of 07/08/2022.    Allergies (verified) Patient has no known allergies.   History: Past Medical History:  Diagnosis Date   Arthritis    knees   Dysrhythmia    bradycardia   GERD (gastroesophageal reflux disease)    Hard of hearing    has a hearing aid  but has not worn   High cholesterol    History of kidney stones    Hypertension    Prostate disorder    Past Surgical History:  Procedure Laterality Date   EYE SURGERY     cataract extraction   HERNIA REPAIR Left 2014   inguinal hernia   PACEMAKER INSERTION Left 12/27/2017   Procedure: INSERTION PACEMAKER-INITIAL DUAL CHAMBER;  Surgeon: Isaias Cowman, MD;  Location: [facility] ORS;  Service: Cardiovascular;  Laterality: Left;   History reviewed. No pertinent family history. Social History   Socioeconomic History   Marital status:  Married    Spouse name: Not on file   Number of children: 9   Years of education: Not on file   Highest education level: Not on file  Occupational History   Not on file  Tobacco Use   Smoking status: Never   Smokeless tobacco: Never  Vaping Use   Vaping Use: Never used  Substance and Sexual Activity   Alcohol use: No   Drug use: No   Sexual activity: Not Currently  Other Topics Concern   Not on file  Social History Narrative   Pt's daughter [name] lives with him   Social Determinants of Health   Financial Resource Strain: Low Risk  (07/08/2022)   Overall Financial Resource Strain (CARDIA)    Difficulty of Paying Living Expenses: Not hard at all  Food Insecurity: No Food Insecurity (07/08/2022)   Hunger Vital Sign    Worried About Running Out of Food in the Last Year: Never true    [place] in the Last Year: Never true  Transportation Needs: No Transportation Needs (07/08/2022)   PRAPARE - Hydrologist (Medical): No    Lack of Transportation (Non-Medical): No  Physical Activity: Insufficiently Active (07/08/2022)   Exercise Vital Sign    Days of Exercise per Week: 3 days    Minutes of Exercise per Session: 30 min  Stress: No Stress Concern Present (07/08/2022)   [place]    Feeling of Stress : Not at all  Social Connections: Moderately Integrated (07/08/2022)   Social Connection and Isolation Panel [NHANES]    Frequency of Communication with Friends and Family: More than three times a week    Frequency of Social Gatherings with Friends and Family: More than three times a week    Attends Religious Services: More than 4 times per year    Active Member of [employer] or Organizations: No    Attends Music therapist: Never    Marital Status: Married    Tobacco Counseling Counseling given: Not Answered   Clinical Intake:  Pre-visit preparation completed:  Yes  Pain : No/denies pain     Diabetes: No  How often do you need to have someone help you when you read instructions, pamphlets, or other written materials from your doctor or pharmacy?: 1 - Never  Diabetic?no  Interpreter Needed?: No  Information entered by :: Kirke Shaggy, LPN   Activities of Daily Living    07/08/2022    8:42 AM  In your present state of health, do you have any difficulty performing the following activities:  Hearing? 1  Vision? 0  Difficulty concentrating or making decisions? 0  Walking or climbing stairs? 0  Dressing or bathing? 0  Doing errands, shopping? 1  Preparing Food and eating ? N  Using the Toilet? N  In the past six months, have  you accidently leaked urine? N  Do you have problems with loss of bowel control? N  Managing your Medications? N  Managing your Finances? N  Housekeeping or managing your Housekeeping? Y    Patient Care Team: Juline Patch, MD as PCP - General (Family Medicine)  Indicate any recent Medical Services you may have received from other than [facility] providers in the past year (date may be approximate).     Assessment:   This is a routine wellness examination for [facility].  Hearing/Vision screen Hearing Screening - Comments:: Has aids, but doesn't wear them Vision Screening - Comments:: Wears glasses- Dr.Bell  Dietary issues and exercise activities discussed: Current Exercise Habits: Home exercise routine, Type of exercise: walking, Time (Minutes): 30, Frequency (Times/Week): 3, Weekly Exercise (Minutes/Week): 90, Intensity: Mild   Goals Addressed             This Visit's Progress    Patient Stated       No goals       Depression Screen    07/08/2022    8:40 AM 06/23/2022   10:09 AM 01/12/2022    9:46 AM 12/11/2021    9:45 AM 12/01/2021    9:10 AM 09/07/2021    1:26 PM 07/01/2021    8:09 AM  PHQ 2/9 Scores  PHQ - 2 Score 0 0 0 0 0 0 0  PHQ- 9 Score 0 0 0 0 0 0 0    Fall Risk    07/08/2022     8:42 AM 06/23/2022   10:09 AM 01/12/2022    9:47 AM 12/11/2021    9:45 AM 12/01/2021    9:10 AM  Fall Risk   Falls in the past year? 0 0 0 0 0  Number falls in past yr: 0 0   0  Injury with Fall? 0 0 0 0 0  Risk for fall due to : No Fall Risks No Fall Risks   No Fall Risks  Follow up Falls prevention discussed;Falls evaluation completed Falls evaluation completed   Falls evaluation completed    FALL RISK PREVENTION PERTAINING TO THE HOME:  Any stairs in or around the home? Yes  If so, are there any without handrails? No  Home free of loose throw rugs in walkways, pet beds, electrical cords, etc? Yes  Adequate lighting in your home to reduce risk of falls? Yes   ASSISTIVE DEVICES UTILIZED TO PREVENT FALLS:  Life alert? No  Use of a cane, walker or w/c? Yes  Grab bars in the bathroom? No  Shower chair or bench in shower? No  Elevated toilet seat or a handicapped toilet? No    Cognitive Function:pt unavailable         Immunizations Immunization History  Administered Date(s) Administered   Fluad Quad(high Dose 65+) 02/07/2019, 03/16/2021, 06/23/2022   Influenza, High Dose Seasonal PF 03/21/2017, 06/20/2018   PFIZER(Purple Top)SARS-COV-2 Vaccination 07/14/2019, 08/07/2019   Pneumococcal Conjugate-13 03/21/2017    TDAP status: Due, Education has been provided regarding the importance of this vaccine. Advised may receive this vaccine at local pharmacy or Health Dept. Aware to provide a copy of the vaccination record if obtained from local pharmacy or Health Dept. Verbalized acceptance and understanding.  Flu Vaccine status: Up to date  Pneumococcal vaccine status: Due, Education has been provided regarding the importance of this vaccine. Advised may receive this vaccine at local pharmacy or Health Dept. Aware to provide a copy of the vaccination record if obtained from local  pharmacy or Health Dept. Verbalized acceptance and understanding.  Covid-19 vaccine status: Completed  vaccines  Qualifies for Shingles Vaccine? No   Zostavax completed No   Shingrix Completed?: No.    Education has been provided regarding the importance of this vaccine. Patient has been advised to call insurance company to determine out of pocket expense if they have not yet received this vaccine. Advised may also receive vaccine at local pharmacy or Health Dept. Verbalized acceptance and understanding.  Screening Tests Health Maintenance  Topic Date Due   COVID-19 Vaccine (3 - 2023-24 season) 07/09/2022 (Originally 01/22/2022)   Zoster Vaccines- Shingrix (1 of 2) 09/21/2022 (Originally 10/05/1973)   Pneumonia Vaccine 40+ Years old (2 of 2 - PPSV23 or PCV20) 06/24/2023 (Originally 05/16/2017)   Medicare Annual Wellness (AWV)  07/09/2023   INFLUENZA VACCINE  Completed   HPV VACCINES  Aged Out   DTaP/Tdap/Td  Discontinued    Health Maintenance  There are no preventive care reminders to display for this patient.   Colorectal cancer screening: No longer required.   Lung Cancer Screening: (Low Dose CT Chest recommended if Age 72-80 years, 30 pack-year currently smoking OR have quit w/in 15years.) does not qualify.    Additional Screening:  Hepatitis C Screening: does not qualify; Completed no  Vision Screening: Recommended annual ophthalmology exams for early detection of glaucoma and other disorders of the eye. Is the patient up to date with their annual eye exam?  Yes  Who is the provider or what is the name of the office in which the patient attends annual eye exams? Dr.Bell If pt is not established with a provider, would they like to be referred to a provider to establish care? No .   Dental Screening: Recommended annual dental exams for proper oral hygiene  Community Resource Referral / Chronic Care Management: CRR required this visit?  No   CCM required this visit?  No      Plan:     I have personally reviewed and noted the following in the patient's chart:   Medical  and social history Use of alcohol, tobacco or illicit drugs  Current medications and supplements including opioid prescriptions. Patient is not currently taking opioid prescriptions. Functional ability and status Nutritional status Physical activity Advanced directives List of other physicians Hospitalizations, surgeries, and ER visits in previous 12 months Vitals Screenings to include cognitive, depression, and falls Referrals and appointments  In addition, I have reviewed and discussed with patient certain preventive protocols, quality metrics, and best practice recommendations. A written personalized care plan for preventive services as well as general preventive health recommendations were provided to patient.     Dionisio David, LPN   [id number]   Nurse Notes: none

## 2022-07-21 DIAGNOSIS — I5022 Chronic systolic (congestive) heart failure: Secondary | ICD-10-CM | POA: Diagnosis not present

## 2022-07-21 DIAGNOSIS — I495 Sick sinus syndrome: Secondary | ICD-10-CM | POA: Diagnosis not present

## 2022-09-20 DIAGNOSIS — H40153 Residual stage of open-angle glaucoma, bilateral: Secondary | ICD-10-CM | POA: Diagnosis not present

## 2022-09-27 ENCOUNTER — Telehealth: Payer: Self-pay | Admitting: Family Medicine

## 2022-09-27 NOTE — Telephone Encounter (Signed)
Copied from CRM [id number]. Topic: Referral - Request for Referral >> Sep 27, 2022 12:18 PM Carrielelia G wrote: Has patient seen PCP for this complaint? No.  Referral for which specialty:  Personal Care Service   Preferred provider/office:   [facility]   Reason for referral:  Multiple issues, family is requesting assistance   Documents are being faxed over today 09/27/2022  to [phone]

## 2022-10-04 ENCOUNTER — Encounter: Payer: Self-pay | Admitting: Family Medicine

## 2022-10-04 ENCOUNTER — Ambulatory Visit (INDEPENDENT_AMBULATORY_CARE_PROVIDER_SITE_OTHER): Payer: Medicare HMO | Admitting: Family Medicine

## 2022-10-04 VITALS — BP 100/50 | HR 60 | Ht 70.0 in | Wt 145.0 lb

## 2022-10-04 DIAGNOSIS — I10 Essential (primary) hypertension: Secondary | ICD-10-CM | POA: Diagnosis not present

## 2022-10-04 DIAGNOSIS — M1711 Unilateral primary osteoarthritis, right knee: Secondary | ICD-10-CM

## 2022-10-04 DIAGNOSIS — R001 Bradycardia, unspecified: Secondary | ICD-10-CM
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
## 2022-11-15 ENCOUNTER — Encounter: Payer: Self-pay | Admitting: Family Medicine

## 2022-11-15 ENCOUNTER — Ambulatory Visit (INDEPENDENT_AMBULATORY_CARE_PROVIDER_SITE_OTHER): Payer: Medicare HMO | Admitting: Family Medicine

## 2022-11-15 VITALS — BP 142/70 | HR 64 | Ht 70.0 in | Wt 143.0 lb

## 2022-11-15 DIAGNOSIS — J301 Allergic rhinitis due to pollen: Secondary | ICD-10-CM | POA: Diagnosis not present

## 2022-11-15 DIAGNOSIS — E860 Dehydration: Secondary | ICD-10-CM | POA: Diagnosis not present

## 2022-11-15 NOTE — Patient Instructions (Signed)
Dehydration, Adult Dehydration is a condition in which there is not enough water or other fluids in the body. This happens when a person loses more fluids than they take in. Important organs cannot work right without the right amount of fluids. Any loss of fluids from the body can cause dehydration. Dehydration can be mild, worse, or very bad. It should be treated right away to keep it from getting very bad. What are the causes? Conditions that cause loss of water in the body. They include: Watery poop (diarrhea). Vomiting. Sweating a lot. Fever. Infection. Peeing (urinating) a lot. Not drinking enough fluids. Certain medicines, such as medicines that take extra fluid out of the body (diuretics). Lack of safe drinking water. Not being able to get enough water and food. What increases the risk? Having a long-term (chronic) illness that has not been treated the right way, such as: Diabetes. Heart disease. Kidney disease. Being 65 years of age or older. Having a disability. Living in a place that is high above the ground or sea (high in altitude). The thinner, drier air causes more fluid loss. Doing exercises that put stress on your body for a long time. Being active when in hot places. What are the signs or symptoms? Symptoms of dehydration depend on how bad it is. Mild or worse dehydration Thirst. Dry lips or dry mouth. Feeling dizzy or light-headed. Muscle cramps. Passing little pee or dark pee. Pee may be the color of tea. Headache. Very bad dehydration Changes in skin. Skin may: Be cold to the touch (clammy). Be blotchy or pale. Not go back to normal right after you pinch it and let it go. Little or no tears, pee, or sweat. Fast breathing. Low blood pressure. Weak pulse. Pulse that is more than 100 beats a minute when you are sitting still. Other changes, such as: Feeling very thirsty. Eyes that look hollow (sunken). Cold hands and feet. Being confused. Being very  tired (lethargic) or having trouble waking from sleep. Losing weight. Loss of consciousness. How is this treated? Treatment for this condition depends on how bad your dehydration is. Treatment should start right away. Do not wait until your condition gets very bad. Very bad dehydration is an emergency. You will need to go to a hospital. Mild or worse dehydration can be treated at home. You may be asked to: Drink more fluids. Drink an oral rehydration solution (ORS). This drink gives you the right amount of fluids, salts, and minerals (electrolytes). Very bad dehydration can be treated: With fluids through an IV tube. By correcting low levels of electrolytes in the body. By treating the problem that caused your dehydration. Follow these instructions at home: Oral rehydration solution If told by your doctor, drink an ORS: Make an ORS. Use instructions on the package. Start by drinking small amounts, about  cup (120 mL) every 5-10 minutes. Slowly drink more until you have had the amount that your doctor said to have.  Eating and drinking  Drink enough clear fluid to keep your pee pale yellow. If you were told to drink an ORS, finish the ORS first. Then, start slowly drinking other clear fluids. Drink fluids such as: Water. Do not drink only water. Doing that can make the salt (sodium) level in your body get too low. Water from ice chips you suck on. Fruit juice that you have added water to (diluted). Low-calorie sports drinks. Eat foods that have the right amounts of salts and minerals, such as bananas, oranges, potatoes,   tomatoes, or spinach. Do not drink alcohol. Avoid drinks that have caffeine or sugar. These include:: High-calorie sports drinks. Fruit juice that you did not add water to. Soda. Coffee or energy drinks. Avoid foods that are greasy or have a lot of fat or sugar. General instructions Take over-the-counter and prescription medicines only as told by your doctor. Do  not take sodium tablets. Doing that can make the salt level in your body get too high. Return to your normal activities as told by your doctor. Ask your doctor what activities are safe for you. Keep all follow-up visits. Your doctor may check and change your treatment. Contact a doctor if: You have pain in your belly (abdomen) and the pain: Gets worse. Stays in one place. You have a rash. You have a stiff neck. You get angry or annoyed more easily than normal. You are more tired or have a harder time waking than normal. You feel weak or dizzy. You feel very thirsty. Get help right away if: You have any symptoms of very bad dehydration. You vomit every time you eat or drink. Your vomiting gets worse, does not go away, or you vomit blood or green stuff. You are getting treatment, but symptoms are getting worse. You have a fever. You have a very bad headache. You have: Diarrhea that gets worse or does not go away. Blood in your poop (stool). This may cause poop to look black and tarry. No pee in 6-8 hours. Only a small amount of pee in 6-8 hours, and the pee is very dark. You have trouble breathing. These symptoms may be an emergency. Get help right away. Call 911. Do not wait to see if the symptoms will go away. Do not drive yourself to the hospital. This information is not intended to replace advice given to you by your health care provider. Make sure you discuss any questions you have with your health care provider. Document Revised: 12/07/2021 Document Reviewed: 12/07/2021 Elsevier Patient Education  2024 Elsevier Inc.  

## 2022-11-15 NOTE — Progress Notes (Signed)
Date:  11/15/2022   Name:  Eric [name]   DOB:  1923-06-10   MRN:  [id number]   Chief Complaint: Dizziness  Dizziness This is a new ("swimmy headed") problem. The current episode started today. Episode frequency: episodic. Pertinent negatives include no chest pain, chills, congestion, coughing, fever, nausea, sore throat, urinary symptoms or weakness.    Lab Results  Component Value Date   NA 137 12/01/2021   K 3.7 12/01/2021   CO2 22 12/01/2021   GLUCOSE 108 (H) 12/01/2021   BUN 14 12/01/2021   CREATININE 1.12 12/01/2021   CALCIUM 8.5 (L) 12/01/2021   EGFR 60 12/01/2021   GFRNONAA 55 (L) 09/03/2019   Lab Results  Component Value Date   CHOL 206 (H) 12/01/2021   HDL 84 12/01/2021   LDLCALC 109 (H) 12/01/2021   TRIG 72 12/01/2021   CHOLHDL 2.5 08/24/2017   No results found for: "TSH" No results found for: "HGBA1C" Lab Results  Component Value Date   WBC 3.8 (L) 12/20/2018   HGB 11.4 (L) 12/20/2018   HCT 35.1 (L) 12/20/2018   MCV 91.2 12/20/2018   PLT 197 12/20/2018   Lab Results  Component Value Date   ALT 8 12/01/2021   AST 17 12/01/2021   ALKPHOS 45 12/01/2021   BILITOT 0.4 12/01/2021   No results found for: "25OHVITD2", "25OHVITD3", "VD25OH"   Review of Systems  Constitutional:  Negative for chills and fever.  HENT:  Negative for congestion, postnasal drip, sinus pressure, sinus pain and sore throat.   Respiratory:  Negative for cough, chest tightness and shortness of breath.   Cardiovascular:  Negative for chest pain.  Gastrointestinal:  Negative for nausea.  Endocrine: Negative for polydipsia and polyuria.  Genitourinary:  Negative for difficulty urinating, frequency and urgency.  Neurological:  Positive for dizziness. Negative for weakness.    Patient Active Problem List   Diagnosis Date Noted   Primary osteoarthritis of right knee 12/11/2021   Other chest pain 10/02/2018   Presence of cardiac pacemaker 01/04/2018   Symptomatic bradycardia  12/08/2017   Abnormal EKG 11/02/2017   Bradycardia by electrocardiogram 11/02/2017   Shortness of breath 11/02/2017   Essential hypertension 10/22/2015   Hyperlipidemia, mixed 10/22/2015   Benign prostatic hyperplasia 10/22/2015   Arthritis, senescent 12/05/2013   Benign essential HTN 02/14/2013   Increased frequency of urination 05/22/2012   Nocturia 05/22/2012   Personal history of prostate cancer 05/22/2012    No Known Allergies  Past Surgical History:  Procedure Laterality Date   EYE SURGERY     cataract extraction   HERNIA REPAIR Left 2014   inguinal hernia   PACEMAKER INSERTION Left 12/27/2017   Procedure: INSERTION PACEMAKER-INITIAL DUAL CHAMBER;  Surgeon: Marcina Millard, MD;  Location: [facility] ORS;  Service: Cardiovascular;  Laterality: Left;    Social History   Tobacco Use   Smoking status: Never   Smokeless tobacco: Never  Vaping Use   Vaping Use: Never used  Substance Use Topics   Alcohol use: No   Drug use: No     Medication list has been reviewed and updated.  Current Meds  Medication Sig   acetaminophen (TYLENOL) 325 MG tablet Take 650 mg by mouth every 6 (six) hours as needed.   amLODipine (NORVASC) 5 MG tablet Take 1 tablet (5 mg total) by mouth 2 (two) times daily.   brimonidine (ALPHAGAN) 0.2 % ophthalmic solution    diclofenac Sodium (VOLTAREN) 1 % GEL Apply 2 g topically 4 (four)  times daily as needed. To affected joint.   fluticasone (FLONASE) 50 MCG/ACT nasal spray USE 1 SPRAY IN EACH NOSTRIL EVERY DAY (NEED MD APPOINTMENT)   latanoprost (XALATAN) 0.005 % ophthalmic solution Place 1 drop into both eyes at bedtime.   losartan (COZAAR) 100 MG tablet Take 1 tablet (100 mg total) by mouth daily.   omeprazole (PRILOSEC) 20 MG capsule Take 1 capsule (20 mg total) by mouth daily.   simvastatin (ZOCOR) 40 MG tablet Take 1 tablet (40 mg total) by mouth at bedtime.   tamsulosin (FLOMAX) 0.4 MG CAPS capsule Take 1 capsule (0.4 mg total) by mouth daily.    timolol (TIMOPTIC) 0.5 % ophthalmic solution SMARTSIG:In Eye(s)   traMADol (ULTRAM) 50 MG tablet Take by mouth.       06/23/2022   10:09 AM 01/12/2022    9:46 AM 12/11/2021    9:45 AM 12/01/2021    9:11 AM  GAD 7 : Generalized Anxiety Score  Nervous, Anxious, on Edge 0 0 0 0  Control/stop worrying 0 0 0 0  Worry too much - different things 0 0 0 0  Trouble relaxing 0 0 0 0  Restless 0 0 0 0  Easily annoyed or irritable 0 0 0 0  Afraid - awful might happen 0 0 0 0  Total GAD 7 Score 0 0 0 0  Anxiety Difficulty Not difficult at all Not difficult at all Not difficult at all Not difficult at all       07/08/2022    8:40 AM 06/23/2022   10:09 AM 01/12/2022    9:46 AM  Depression screen PHQ 2/9  Decreased Interest 0 0 0  Down, Depressed, Hopeless 0 0 0  PHQ - 2 Score 0 0 0  Altered sleeping 0 0 0  Tired, decreased energy 0 0 0  Change in appetite 0 0 0  Feeling bad or failure about yourself  0 0 0  Trouble concentrating 0 0 0  Moving slowly or fidgety/restless 0 0 0  Suicidal thoughts 0 0 0  PHQ-9 Score 0 0 0  Difficult doing work/chores Not difficult at all Not difficult at all     BP Readings from Last 3 Encounters:  11/15/22 (!) 142/70  10/04/22 (!) 100/50  06/23/22 126/60    Physical Exam HENT:     Head: Normocephalic.     Right Ear: Tympanic membrane and ear canal normal.     Left Ear: Tympanic membrane and ear canal normal.     Nose: Nose normal.     Mouth/Throat:     Mouth: Mucous membranes are dry.     Pharynx: Oropharynx is clear.  Eyes:     Extraocular Movements: Extraocular movements intact.     Conjunctiva/sclera: Conjunctivae normal.     Pupils: Pupils are equal, round, and reactive to light.  Cardiovascular:     Rate and Rhythm: Normal rate and regular rhythm.     Pulses: Normal pulses.     Heart sounds: Normal heart sounds. No murmur heard.    No gallop.  Pulmonary:     Effort: No respiratory distress.     Breath sounds: No wheezing, rhonchi or  rales.  Abdominal:     Palpations: Abdomen is soft.     Tenderness: There is no abdominal tenderness. There is no right CVA tenderness, left CVA tenderness, guarding or rebound.  Musculoskeletal:     Cervical back: Normal range of motion.  Neurological:     Mental Status: He  is alert.     Cranial Nerves: Cranial nerves 2-12 are intact.     Sensory: Sensation is intact.     Motor: Motor function is intact.     Deep Tendon Reflexes:     Reflex Scores:      Bicep reflexes are 2+ on the right side and 2+ on the left side.      Patellar reflexes are 2+ on the right side and 2+ on the left side.    Wt Readings from Last 3 Encounters:  11/15/22 143 lb (64.9 kg)  10/04/22 145 lb (65.8 kg)  07/08/22 144 lb (65.3 kg)    BP (!) 142/70   Pulse 64   Ht 5\' 10"  (1.778 m)   Wt 143 lb (64.9 kg)   SpO2 97%   BMI 20.52 kg/m   Assessment and Plan:  1. Dehydration New onset.  Episodic.  Relatively stable.  However patient seems to be working on his lawn more his hours at a time in an air conditioned shed during the day.  He does admit to having fans but is not consuming significant amount of water and this temperature and patient has been encouraged to be mindful of this and not to spend as much time out in the heat and to be certain to have sufficient amount of fluid intake.  Patient has been given some Ensure for electrolytes and we have encouraged him to return if needed.  Full neurologic examination was done to rule out any possibility of CVA at this time.  It was also noted that patient is 2 pounds less than it was in May and this may reflect the dehydration as aforementioned.  2. Seasonal allergic rhinitis due to pollen Patient has some burning in his nasal passages which may be due to the alcohol-based Flonase or it could be due to the allergic rhinitis itself I have encouraged him to continue to do this as well.   Elizabeth Sauer, MD

## 2022-12-07 DIAGNOSIS — I5022 Chronic systolic (congestive) heart failure: Secondary | ICD-10-CM | POA: Diagnosis not present

## 2022-12-08 ENCOUNTER — Ambulatory Visit: Payer: Self-pay | Admitting: *Deleted
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
## 2022-12-09 ENCOUNTER — Encounter: Payer: Self-pay | Admitting: Emergency Medicine

## 2022-12-09 ENCOUNTER — Observation Stay: Payer: Medicare HMO

## 2022-12-09 ENCOUNTER — Other Ambulatory Visit: Payer: Self-pay

## 2022-12-09 ENCOUNTER — Observation Stay
Admission: EM | Admit: 2022-12-09 | Discharge: 2022-12-09 | Disposition: A | Discharge status: Home / Self Care | Payer: Medicare HMO | Attending: Hospitalist | Admitting: Hospitalist

## 2022-12-09 ENCOUNTER — Observation Stay (HOSPITAL_BASED_OUTPATIENT_CLINIC_OR_DEPARTMENT_OTHER)
Admit: 2022-12-09 | Discharge: 2022-12-09 | Disposition: A | Discharge status: Home / Self Care | Payer: Medicare HMO | Attending: Internal Medicine | Admitting: Internal Medicine

## 2022-12-09 ENCOUNTER — Emergency Department: Payer: Medicare HMO

## 2022-12-09 DIAGNOSIS — R42 Dizziness and giddiness: Secondary | ICD-10-CM | POA: Diagnosis not present

## 2022-12-09 DIAGNOSIS — I6523 Occlusion and stenosis of bilateral carotid arteries: Secondary | ICD-10-CM | POA: Diagnosis not present

## 2022-12-09 DIAGNOSIS — Z79899 Other long term (current) drug therapy: Secondary | ICD-10-CM | POA: Insufficient documentation

## 2022-12-09 DIAGNOSIS — N400 Enlarged prostate without lower urinary tract symptoms: Secondary | ICD-10-CM | POA: Diagnosis present

## 2022-12-09 DIAGNOSIS — Z950 Presence of cardiac pacemaker: Secondary | ICD-10-CM | POA: Diagnosis present

## 2022-12-09 DIAGNOSIS — G238 Other specified degenerative diseases of basal ganglia: Secondary | ICD-10-CM | POA: Diagnosis not present

## 2022-12-09 DIAGNOSIS — I771 Stricture of artery: Secondary | ICD-10-CM | POA: Diagnosis not present

## 2022-12-09 DIAGNOSIS — R110 Nausea: Secondary | ICD-10-CM | POA: Diagnosis not present

## 2022-12-09 DIAGNOSIS — R0689 Other abnormalities of breathing: Secondary | ICD-10-CM | POA: Diagnosis not present

## 2022-12-09 DIAGNOSIS — I10 Essential (primary) hypertension: Secondary | ICD-10-CM | POA: Diagnosis not present

## 2022-12-09 DIAGNOSIS — E782 Mixed hyperlipidemia: Secondary | ICD-10-CM | POA: Diagnosis present

## 2022-12-09 DIAGNOSIS — I6782 Cerebral ischemia: Secondary | ICD-10-CM | POA: Diagnosis not present

## 2022-12-09 DIAGNOSIS — R609 Edema, unspecified: Secondary | ICD-10-CM | POA: Diagnosis not present

## 2022-12-09 DIAGNOSIS — I672 Cerebral atherosclerosis: Secondary | ICD-10-CM | POA: Diagnosis not present

## 2022-12-09 DIAGNOSIS — D649 Anemia, unspecified: Secondary | ICD-10-CM | POA: Diagnosis not present

## 2022-12-09 LAB — ECHOCARDIOGRAM COMPLETE
AR max vel: 2.59 cm2
AV Area VTI: 3 cm2
AV Area mean vel: 3.02 cm2
AV Mean grad: 2 mmHg
AV Peak grad: 3.6 mmHg
Ao pk vel: 0.95 m/s
Area-P 1/2: 2.94 cm2
Height: 72 in
MV VTI: 2.49 cm2
S' Lateral: 2.9 cm
Weight: 2240 oz

## 2022-12-09 LAB — CBC WITH DIFFERENTIAL/PLATELET
Abs Immature Granulocytes: 0.01 10*3/uL (ref 0.00–0.07)
Basophils Absolute: 0 10*3/uL (ref 0.0–0.1)
Basophils Relative: 1 %
Eosinophils Absolute: 0.2 10*3/uL (ref 0.0–0.5)
Eosinophils Relative: 4 %
HCT: 33.8 % — ABNORMAL LOW (ref 39.0–52.0)
Hemoglobin: 11.4 g/dL — ABNORMAL LOW (ref 13.0–17.0)
Immature Granulocytes: 0 %
Lymphocytes Relative: 38 %
Lymphs Abs: 1.5 10*3/uL (ref 0.7–4.0)
MCH: 29.7 pg (ref 26.0–34.0)
MCHC: 33.7 g/dL (ref 30.0–36.0)
MCV: 88 fL (ref 80.0–100.0)
Monocytes Absolute: 0.6 10*3/uL (ref 0.1–1.0)
Monocytes Relative: 14 %
Neutro Abs: 1.8 10*3/uL (ref 1.7–7.7)
Neutrophils Relative %: 43 %
Platelets: 204 10*3/uL (ref 150–400)
RBC: 3.84 MIL/uL — ABNORMAL LOW (ref 4.22–5.81)
RDW: 13.2 % (ref 11.5–15.5)
WBC: 4 10*3/uL (ref 4.0–10.5)
nRBC: 0 % (ref 0.0–0.2)

## 2022-12-09 LAB — COMPREHENSIVE METABOLIC PANEL
ALT: 13 U/L (ref 0–44)
AST: 19 U/L (ref 15–41)
Albumin: 3.5 g/dL (ref 3.5–5.0)
Alkaline Phosphatase: 39 U/L (ref 38–126)
Anion gap: 6 (ref 5–15)
BUN: 26 mg/dL — ABNORMAL HIGH (ref 8–23)
CO2: 25 mmol/L (ref 22–32)
Calcium: 8.6 mg/dL — ABNORMAL LOW (ref 8.9–10.3)
Chloride: 105 mmol/L (ref 98–111)
Creatinine, Ser: 1.15 mg/dL (ref 0.61–1.24)
GFR, Estimated: 58 mL/min — ABNORMAL LOW (ref >60)
Glucose, Bld: 114 mg/dL — ABNORMAL HIGH (ref 70–99)
Potassium: 3.7 mmol/L (ref 3.5–5.1)
Sodium: 136 mmol/L (ref 135–145)
Total Bilirubin: 0.4 mg/dL (ref 0.3–1.2)
Total Protein: 7 g/dL (ref 6.5–8.1)

## 2022-12-09 LAB — URINALYSIS, ROUTINE W REFLEX MICROSCOPIC
Bilirubin Urine: NEGATIVE
Glucose, UA: NEGATIVE mg/dL
Hgb urine dipstick: NEGATIVE
Ketones, ur: NEGATIVE mg/dL
Leukocytes,Ua: NEGATIVE
Nitrite: NEGATIVE
Protein, ur: NEGATIVE mg/dL
Specific Gravity, Urine: 1.006 (ref 1.005–1.030)
pH: 7 (ref 5.0–8.0)

## 2022-12-09 LAB — LIPID PANEL
Cholesterol: 140 mg/dL (ref 0–200)
HDL: 64 mg/dL (ref >40)
LDL Cholesterol: 66 mg/dL (ref 0–99)
Total CHOL/HDL Ratio: 2.2 RATIO
Triglycerides: 48 mg/dL (ref <150)
VLDL: 10 mg/dL (ref 0–40)

## 2022-12-09 MED ORDER — ATORVASTATIN CALCIUM 20 MG PO TABS
20.0000 mg | ORAL_TABLET | Freq: Every day | ORAL | Status: DC
  Administered 2022-12-09: 20 mg via ORAL
  Filled 2022-12-09: qty 1

## 2022-12-09 MED ORDER — ACETAMINOPHEN 325 MG PO TABS: 650.0000 mg | ORAL_TABLET | ORAL | Status: DC | PRN

## 2022-12-09 MED ORDER — STROKE: EARLY STAGES OF RECOVERY BOOK: Freq: Once | Status: DC

## 2022-12-09 MED ORDER — HYDRALAZINE HCL 20 MG/ML IJ SOLN: 10.0000 mg | Freq: Four times a day (QID) | INTRAMUSCULAR | Status: DC | PRN

## 2022-12-09 MED ORDER — ACETAMINOPHEN 160 MG/5ML PO SOLN: 650.0000 mg | ORAL | Status: DC | PRN

## 2022-12-09 MED ORDER — TAMSULOSIN HCL 0.4 MG PO CAPS
0.4000 mg | ORAL_CAPSULE | Freq: Every day | ORAL | Status: DC
  Administered 2022-12-09: 0.4 mg via ORAL
  Filled 2022-12-09: qty 1

## 2022-12-09 MED ORDER — ASPIRIN 325 MG PO TABS: 325.0000 mg | ORAL_TABLET | Freq: Every day | ORAL | Status: DC

## 2022-12-09 MED ORDER — ASPIRIN 300 MG RE SUPP: 300.0000 mg | Freq: Every day | RECTAL | Status: DC

## 2022-12-09 MED ORDER — LOSARTAN POTASSIUM 50 MG PO TABS
100.0000 mg | ORAL_TABLET | Freq: Every day | ORAL | Status: DC
  Administered 2022-12-09: 100 mg via ORAL
  Filled 2022-12-09: qty 2

## 2022-12-09 MED ORDER — ACETAMINOPHEN 650 MG RE SUPP: 650.0000 mg | RECTAL | Status: DC | PRN

## 2022-12-09 MED ORDER — SIMVASTATIN 10 MG PO TABS: 40.0000 mg | ORAL_TABLET | Freq: Every day | ORAL | Status: DC

## 2022-12-09 MED ORDER — MECLIZINE HCL 25 MG PO TABS
25.0000 mg | ORAL_TABLET | Freq: Once | ORAL | Status: AC
  Administered 2022-12-09: 25 mg via ORAL
  Filled 2022-12-09: qty 1

## 2022-12-09 MED ORDER — AMLODIPINE BESYLATE 5 MG PO TABS
5.0000 mg | ORAL_TABLET | Freq: Two times a day (BID) | ORAL | Status: DC
  Administered 2022-12-09: 5 mg via ORAL
  Filled 2022-12-09: qty 1

## 2022-12-09 MED ORDER — MECLIZINE HCL 25 MG PO TABS: 25.0000 mg | ORAL_TABLET | Freq: Three times a day (TID) | ORAL | Status: DC | PRN

## 2022-12-09 MED ORDER — ENOXAPARIN SODIUM 40 MG/0.4ML IJ SOSY
40.0000 mg | PREFILLED_SYRINGE | INTRAMUSCULAR | Status: DC
  Administered 2022-12-09: 40 mg via SUBCUTANEOUS
  Filled 2022-12-09: qty 0.4

## 2022-12-09 NOTE — Evaluation (Signed)
Occupational Therapy Evaluation Patient Details Name: Eric [name] MRN: [id number] DOB: 06-15-1923 Today's Date: 12/09/2022   History of Present Illness Pt is a 89 y.o. 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 Pt admitted to ED (7/18) with concerns of dizziness/vertigo.   Clinical Impression   Eric [name] was seen for OT evaluation this date. Pt lives alone with family available daily. Pt currently requires initial MIN A sit<>stand, improves to CGA + RW for toilet t/f. SBA don underwear in sitting and don/doff gown. Reports dizziness t/o session with no increase or resolution noted, difficulty obtaining accurate BP related to patient restlessness/impulsivity. Pt would benefit from skilled OT to address noted impairments and functional limitations (see below for any additional details). Upon hospital discharge, recommend follow up therapy.   Recommendations for follow up therapy are one component of a multi-disciplinary discharge planning process, led by the attending physician.  Recommendations may be updated based on patient status, additional functional criteria and insurance authorization.   Assistance Recommended at Discharge Intermittent Supervision/Assistance  Patient can return home with the following A little help with walking and/or transfers;A little help with bathing/dressing/bathroom;Help with stairs or ramp for entrance    Functional Status Assessment  Patient has had a recent decline in their functional status and demonstrates the ability to make significant improvements in function in a reasonable and predictable amount of time.  Equipment Recommendations  None recommended by OT    Recommendations for Other Services       Precautions / Restrictions Precautions Precautions: Fall Restrictions Weight Bearing Restrictions: No      Mobility Bed Mobility Overal bed mobility: Modified Independent                   Transfers Overall transfer level: Needs assistance Equipment used: Rolling walker (2 wheels) Transfers: Sit to/from Stand Sit to Stand: Min assist           General transfer comment: from low bed height      Balance Overall balance assessment: Needs assistance Sitting-balance support: Feet supported, No upper extremity supported Sitting balance-Leahy Scale: Good     Standing balance support: Bilateral upper extremity supported Standing balance-Leahy Scale: Fair                             ADL either performed or assessed with clinical judgement   ADL Overall ADL's : Needs assistance/impaired                                       General ADL Comments: CGA + RW for toilet t/f. SBA don underwear in sitting and don/doff gown.      Pertinent Vitals/Pain Pain Assessment Pain Assessment: No/denies pain     Hand Dominance     Extremity/Trunk Assessment Upper Extremity Assessment Upper Extremity Assessment: Overall WFL for tasks assessed   Lower Extremity Assessment Lower Extremity Assessment: Generalized weakness       Communication Communication Communication: No difficulties   Cognition Arousal/Alertness: Awake/alert Behavior During Therapy: WFL for tasks assessed/performed Overall Cognitive Status: History of cognitive impairments - at baseline                                 General Comments: follows 1 step commands, difficulty explaining specifics  of his dizziness/symptoms                Home Living Family/patient expects to be discharged to:: Private residence Living Arrangements: Alone Available Help at Discharge: Family (Niece helps from 10 am - 5 pm everyday) Type of Home: House Home Access: Level entry     Home Layout: One level               Home Equipment: Agricultural consultant (2 wheels);Cane - single point          Prior Functioning/Environment Prior Level of Function :  Independent/Modified Independent             Mobility Comments: Pt independent with mobility, uses SPC most of time but also has RW. Limited community ambulator. 3 falls in last week secondary to getting dizzy and legs giving out. ADLs Comments: Independent with all ADLs, niece says he's able to get up, get dressed, cook breakfast, etc. independently. Ind. driving, running errands.        OT Problem List: Decreased activity tolerance;Impaired balance (sitting and/or standing);Decreased safety awareness      OT Treatment/Interventions: Self-care/ADL training;Therapeutic exercise;Energy conservation;DME and/or AE instruction;Therapeutic activities;Patient/family education;Balance training    OT Goals(Current goals can be found in the care plan section) Acute Rehab OT Goals Patient Stated Goal: to go home OT Goal Formulation: With patient Time For Goal Achievement: 12/23/22 Potential to Achieve Goals: Good ADL Goals Pt Will Perform Grooming: with modified independence;standing Pt Will Perform Lower Body Dressing: with modified independence;sit to/from stand Pt Will Transfer to Toilet: with modified independence;ambulating;regular height toilet  OT Frequency: Min 1X/week    Co-evaluation              AM-PAC OT "6 Clicks" Daily Activity     Outcome Measure Help from another person eating meals?: None Help from another person taking care of personal grooming?: A Little Help from another person toileting, which includes using toliet, bedpan, or urinal?: A Little Help from another person bathing (including washing, rinsing, drying)?: A Little Help from another person to put on and taking off regular upper body clothing?: A Little Help from another person to put on and taking off regular lower body clothing?: A Little 6 Click Score: 19   End of Session    Activity Tolerance: Patient tolerated treatment well Patient left: in bed;with call bell/phone within reach;with bed alarm  set  OT Visit Diagnosis: Unsteadiness on feet (R26.81);Muscle weakness (generalized) (M62.81)                Time: 7829-5621 OT Time Calculation (min): 24 min Charges:  OT General Charges $OT Visit: 1 Visit OT Evaluation $OT Eval Low Complexity: 1 Low OT Treatments $Self Care/Home Management : 8-22 mins  Kathie Dike, M.S. OTR/L  12/09/22, 3:29 PM  ascom [phone]

## 2022-12-09 NOTE — Assessment & Plan Note (Addendum)
Uncontrolled, with systolic in the 170s and 180s Continue amlodipine and losartan.  Niece at bedside states patient is compliant with his meds Hydralazine as needed

## 2022-12-09 NOTE — ED Provider Notes (Signed)
[facility] Provider Note    Event Date/Time   First MD Initiated Contact with Patient 12/09/22 0111     (approximate)   History   Chief Complaint Dizziness   HPI  Eric [name] is a 89 y.o. male with past medical history of hypertension, hyperlipidemia, GERD, and bradycardia status post pacemaker who presents to the ED complaining of dizziness.  Patient reports that he has been feeling "swimmy headed" ever since he went to bed last night.  He is unable to state exactly what time he went to bed, but states that when he laid down it was like his bed was tilting to the side.  He denies any lightheadedness or feeling like he is going to pass out.  He has not had any chest pain or shortness of breath.  He denies any vision changes, speech changes, numbness or weakness in his extremities.     Physical Exam   Triage Vital Signs: ED Triage Vitals [12/09/22 0117]  Encounter Vitals Group     BP      Systolic BP Percentile      Diastolic BP Percentile      Pulse      Resp      Temp      Temp src      SpO2 96 %     Weight      Height      Head Circumference      Peak Flow      Pain Score      Pain Loc      Pain Education      Exclude from Growth Chart     Most recent vital signs: Vitals:   12/09/22 0130 12/09/22 0230  BP: (!) 186/91 (!) 176/73  Pulse: 72 (!) 59  Resp: (!) 28 (!) 22  Temp:    SpO2: 99% 99%    Constitutional: Alert and oriented. Eyes: Conjunctivae are normal. Head: Atraumatic. Nose: No congestion/rhinnorhea. Mouth/Throat: Mucous membranes are moist.  Cardiovascular: Normal rate, regular rhythm. Grossly normal heart sounds.  2+ radial pulses bilaterally. Respiratory: Normal respiratory effort.  No retractions. Lungs CTAB. Gastrointestinal: Soft and nontender. No distention. Musculoskeletal: No lower extremity tenderness, 1+ pitting edema to mid shins bilaterally. Neurologic:  Normal speech and language. No gross focal  neurologic deficits are appreciated.    ED Results / Procedures / Treatments   Labs (all labs ordered are listed, but only abnormal results are displayed) Labs Reviewed  COMPREHENSIVE METABOLIC PANEL - Abnormal; Notable for the following components:      Result Value   Glucose, Bld 114 (*)    BUN 26 (*)    Calcium 8.6 (*)    GFR, Estimated 58 (*)    All other components within normal limits  CBC WITH DIFFERENTIAL/PLATELET - Abnormal; Notable for the following components:   RBC 3.84 (*)    Hemoglobin 11.4 (*)    HCT 33.8 (*)    All other components within normal limits  URINALYSIS, ROUTINE W REFLEX MICROSCOPIC     EKG  ED ECG REPORT I, Chesley Noon, the attending physician, personally viewed and interpreted this ECG.   Date: 12/09/2022  EKG Time: 1:18  Rate: 62  Rhythm: normal sinus rhythm  Axis: Normal  Intervals:none  Eric&T Change: LVH  RADIOLOGY CT head reviewed and interpreted by me with no hemorrhage or midline shift.  PROCEDURES:  Critical Care performed: No  Procedures   MEDICATIONS ORDERED IN ED: Medications  meclizine (ANTIVERT) tablet 25 mg (25 mg Oral Given 12/09/22 0124)     IMPRESSION / MDM / ASSESSMENT AND PLAN / ED COURSE  I reviewed the triage vital signs and the nursing notes.                              89 y.o. male with past medical history of hypertension, hyperlipidemia, GERD, and bradycardia status post pacemaker who presents to the ED complaining of dizziness and feeling like his bed was moving when he laid down to go to bed this evening.  Patient's presentation is most consistent with acute presentation with potential threat to life or bodily function.  Differential diagnosis includes, but is not limited to, stroke, central vertigo, peripheral vertigo, TIA, anemia, electrolyte abnormality, arrhythmia.  Patient nontoxic-appearing and in no acute distress, vital signs are unremarkable.  He has no focal neurologic deficits on exam,  describes dizziness in association with movement sounds most typical of peripheral vertigo.  We will check CT head and labs, EKG shows no evidence of arrhythmia or ischemia.  We will trial dose of meclizine following swallow screen.  We will hold off on code stroke given unclear timing of last known well.  CT head is negative for acute process, labs are reassuring with no significant anemia, leukocytosis, showed abnormality, or AKI.  LFTs are also unremarkable.  On reassessment, patient reports that he feels better with improvement in dizziness, however he became significantly dizzy when attempting to stand and was unable to walk on his own.  While symptoms seem most likely to be related to peripheral vertigo, patient would benefit from admission for further stroke rule out.  Unfortunately, he is unable to get MRI at this time due to presence of pacemaker.  Case discussed with hospitalist for admission.      FINAL CLINICAL IMPRESSION(S) / ED DIAGNOSES   Final diagnoses:  Dizziness     Rx / DC Orders   ED Discharge Orders     None        Note:  This document was prepared using Dragon voice recognition software and may include unintentional dictation errors.   Chesley Noon, MD 12/09/22 [phone]

## 2022-12-09 NOTE — ED Notes (Signed)
Patient provided with warm blankets.  No dizziness reported at this time with patient not moving or attempting to sit up.  No reports of pain.  Urinals at bedside within reach of patient

## 2022-12-09 NOTE — Progress Notes (Signed)
*  PRELIMINARY RESULTS* Echocardiogram 2D Echocardiogram has been performed.  Eric [name] 12/09/2022, 7:48 AM

## 2022-12-09 NOTE — Assessment & Plan Note (Signed)
Continue simvastatin. 

## 2022-12-09 NOTE — H&P (Signed)
History and Physical    Patient: Eric [name] ZOX:[id number] DOB: 1924-03-13 DOA: 12/09/2022 DOS: the patient was seen and examined on 12/09/2022 PCP: Duanne Limerick, MD  Patient coming from: Home  Chief Complaint:  Chief Complaint  Patient presents with   Dizziness    HPI: [place] Gikas is a 89 y.o. male with medical history significant for Dual-chamber pacemaker (2019) for symptomatic bradycardia, HTN, BPH who presents to the ED for evaluation of dizziness that was of sudden onset when he went to bed.  He states the bed felt like it was spinning.   Reviewing PCP records, patient was seen about 3 weeks prior on 11/15/2022 with a complaint of dizziness and feeling swimmy headed and was diagnosed with dehydration and instructed to drink more water..  Niece at bedside confirms that his symptoms started 3 weeks prior and states that she has been staying with him since the onset of the symptoms.  She states that he stays hydrated with Gatorade but notices that the symptoms have been on and off, but specifically when he changes position.  Given that the symptoms were not improving they made another appointment to follow-up with the PCP however due to acute worsening tonight they brought him into the ED.He denied visual disturbance, headache, weakness numbness or tingling in the extremities.  He had no lightheadedness, palpitations or chest pain.  ED course and data review: BP elevated with systolic in the 170s to 180s, pulse 59-72, respirations up to 28 with O2 sat in the high 90s.  Labs for the most part unremarkable.  Hemoglobin at baseline at 11.4. EKG, personally viewed and interpreted showing sinus rhythm at 61 nonspecific ST-T wave changes. CT head with no acute intracranial abnormality.  Chronic small vessel ischemic disease and chronic infarcts. MRI was considered by ED provider but unable to get as patient has a pacemaker. Patient was given a dose of meclizine 25 mg but continued to be  symptomatic. Hospitalist consulted for admission.    Past Medical History:  Diagnosis Date   Arthritis    knees   Dysrhythmia    bradycardia   GERD (gastroesophageal reflux disease)    Hard of hearing    has a hearing aid but has not worn   High cholesterol    History of kidney stones    Hypertension    Prostate disorder    Past Surgical History:  Procedure Laterality Date   EYE SURGERY     cataract extraction   HERNIA REPAIR Left 2014   inguinal hernia   PACEMAKER INSERTION Left 12/27/2017   Procedure: INSERTION PACEMAKER-INITIAL DUAL CHAMBER;  Surgeon: Marcina Millard, MD;  Location: [facility] ORS;  Service: Cardiovascular;  Laterality: Left;   Social History:  reports that he has never smoked. He has never used smokeless tobacco. He reports that he does not drink alcohol and does not use drugs.  No Known Allergies  History reviewed. No pertinent family history.  Prior to Admission medications   Medication Sig Start Date End Date Taking? Authorizing Provider  acetaminophen (TYLENOL) 325 MG tablet Take 650 mg by mouth every 6 (six) hours as needed.    [provider]  amLODipine (NORVASC) 5 MG tablet Take 1 tablet (5 mg total) by mouth 2 (two) times daily. 06/23/22   Duanne Limerick, MD  brimonidine (ALPHAGAN) 0.2 % ophthalmic solution  07/06/19   [provider]  diclofenac Sodium (VOLTAREN) 1 % GEL Apply 2 g topically 4 (four) times daily as  needed. To affected joint. 12/11/21   Jerrol Banana, MD  fluticasone (FLONASE) 50 MCG/ACT nasal spray USE 1 SPRAY IN EACH NOSTRIL EVERY DAY (NEED MD APPOINTMENT) 06/23/22   Duanne Limerick, MD  latanoprost (XALATAN) 0.005 % ophthalmic solution Place 1 drop into both eyes at bedtime.    [provider]  losartan (COZAAR) 100 MG tablet Take 1 tablet (100 mg total) by mouth daily. 06/23/22   Duanne Limerick, MD  omeprazole (PRILOSEC) 20 MG capsule Take 1 capsule (20 mg total) by mouth daily. 06/23/22   Duanne Limerick, MD  simvastatin (ZOCOR) 40 MG tablet Take 1 tablet (40 mg total) by mouth at bedtime. 06/23/22   Duanne Limerick, MD  tamsulosin (FLOMAX) 0.4 MG CAPS capsule Take 1 capsule (0.4 mg total) by mouth daily. 06/23/22   Duanne Limerick, MD  timolol (TIMOPTIC) 0.5 % ophthalmic solution SMARTSIG:In Eye(s) 11/21/20   [provider]  traMADol (ULTRAM) 50 MG tablet Take by mouth.    [provider]    Physical Exam: Vitals:   12/09/22 0117 12/09/22 0120 12/09/22 0130 12/09/22 0230  BP:  (!) 173/92 (!) 186/91 (!) 176/73  Pulse:  61 72 (!) 59  Resp:  18 (!) 28 (!) 22  Temp:  97.8 F (36.6 C)    TempSrc:  Oral    SpO2: 96% 98% 99% 99%   Physical Exam Vitals and nursing note reviewed.  Constitutional:      General: He is not in acute distress. HENT:     Head: Normocephalic and atraumatic.  Cardiovascular:     Rate and Rhythm: Normal rate and regular rhythm.     Heart sounds: Normal heart sounds.  Pulmonary:     Effort: Pulmonary effort is normal.     Breath sounds: Normal breath sounds.  Abdominal:     Palpations: Abdomen is soft.     Tenderness: There is no abdominal tenderness.  Neurological:     General: No focal deficit present.     Mental Status: Mental status is at baseline.     Labs on Admission: I have personally reviewed following labs and imaging studies  CBC: Recent Labs  Lab 12/09/22 0117  WBC 4.0  NEUTROABS 1.8  HGB 11.4*  HCT 33.8*  MCV 88.0  PLT 204   Basic Metabolic Panel: Recent Labs  Lab 12/09/22 0117  NA 136  K 3.7  CL 105  CO2 25  GLUCOSE 114*  BUN 26*  CREATININE 1.15  CALCIUM 8.6*   GFR: CrCl cannot be calculated (Unknown ideal weight.). Liver Function Tests: Recent Labs  Lab 12/09/22 0117  AST 19  ALT 13  ALKPHOS 39  BILITOT 0.4  PROT 7.0  ALBUMIN 3.5   No results for input(s): "LIPASE", "AMYLASE" in the last 168 hours. No results for input(s): "AMMONIA" in the last 168 hours. Coagulation Profile: No  results for input(s): "INR", "PROTIME" in the last 168 hours. Cardiac Enzymes: No results for input(s): "CKTOTAL", "CKMB", "CKMBINDEX", "TROPONINI" in the last 168 hours. BNP (last 3 results) No results for input(s): "PROBNP" in the last 8760 hours. HbA1C: No results for input(s): "HGBA1C" in the last 72 hours. CBG: No results for input(s): "GLUCAP" in the last 168 hours. Lipid Profile: No results for input(s): "CHOL", "HDL", "LDLCALC", "TRIG", "CHOLHDL", "LDLDIRECT" in the last 72 hours. Thyroid Function Tests: No results for input(s): "TSH", "T4TOTAL", "FREET4", "T3FREE", "THYROIDAB" in the last 72 hours. Anemia Panel: No results for input(s): "VITAMINB12", "FOLATE", "  FERRITIN", "TIBC", "IRON", "RETICCTPCT" in the last 72 hours. Urine analysis:    Component Value Date/Time   COLORURINE YELLOW (A) 07/21/2021 1628   APPEARANCEUR TURBID (A) 07/21/2021 1628   APPEARANCEUR Clear 11/01/2011 1635   LABSPEC 1.016 07/21/2021 1628   LABSPEC 1.013 11/01/2011 1635   PHURINE 5.0 07/21/2021 1628   GLUCOSEU NEGATIVE 07/21/2021 1628   GLUCOSEU Negative 11/01/2011 1635   HGBUR LARGE (A) 07/21/2021 1628   BILIRUBINUR NEGATIVE 07/21/2021 1628   BILIRUBINUR neg 04/20/2021 1522   BILIRUBINUR Negative 11/01/2011 1635   KETONESUR NEGATIVE 07/21/2021 1628   PROTEINUR 100 (A) 07/21/2021 1628   UROBILINOGEN 2.0 (A) 04/20/2021 1522   NITRITE NEGATIVE 07/21/2021 1628   LEUKOCYTESUR LARGE (A) 07/21/2021 1628   LEUKOCYTESUR Negative 11/01/2011 1635    Radiological Exams on Admission: CT HEAD WO CONTRAST  Result Date: 12/09/2022 CLINICAL DATA:  Vertigo EXAM: CT HEAD WITHOUT CONTRAST TECHNIQUE: Contiguous axial images were obtained from the base of the skull through the vertex without intravenous contrast. RADIATION DOSE REDUCTION: This exam was performed according to the departmental dose-optimization program which includes automated exposure control, adjustment of the mA and/or kV according to patient  size and/or use of iterative reconstruction technique. COMPARISON:  CT head 07/29/2008 FINDINGS: Brain: No intracranial hemorrhage, mass effect, or evidence of acute infarct. No hydrocephalus. No extra-axial fluid collection. Generalized cerebral atrophy. Ill-defined hypoattenuation within the cerebral white matter is nonspecific but consistent with chronic small vessel ischemic disease. Chronic infarcts. Physiologic basal ganglia calcifications Vascular: No hyperdense vessel. Intracranial arterial calcification. Skull: No fracture or focal lesion. Sinuses/Orbits: Chronic left maxillary sinusitis. Other: None. IMPRESSION: 1. No acute intracranial abnormality. 2. Chronic small vessel ischemic disease and chronic infarcts. Electronically Signed   By: Minerva Fester M.D.   On: 12/09/2022 01:51     Data Reviewed: Relevant notes from primary care and specialist visits, past discharge summaries as available in EHR, including Care Everywhere. Prior diagnostic testing as pertinent to current admission diagnoses Updated medications and problem lists for reconciliation ED course, including vitals, labs, imaging, treatment and response to treatment Triage notes, nursing and pharmacy notes and ED provider's notes Notable results as noted in HPI   Assessment and Plan: * Vertigo Peripheral versus central.  Positional Symptom onset 3 weeks prior CT head showed chronic small vessel ischemic disease and chronic infarcts Received meclizine in the ED without effect Will get neurology consult Fall precautions and out of bed with assistance only Continue meclizine for now  Essential hypertension Uncontrolled, with systolic in the 170s and 180s Continue amlodipine and losartan.  Niece at bedside states patient is compliant with his meds Hydralazine as needed   Chronic anemia Stable  Presence of cardiac pacemaker No acute issues suspected  Benign prostatic hyperplasia Continue  tamsulosin  Hyperlipidemia, mixed Continue simvastatin      DVT prophylaxis: Lovenox  Consults: Neurology, Dr. Selina Cooley  Advance Care Planning:   Code Status: Prior   Family Communication: Niece at bedside, Rosamaria Lints Disposition Plan: Back to previous home environment  Severity of Illness: The appropriate patient status for this patient is OBSERVATION. Observation status is judged to be reasonable and necessary in order to provide the required intensity of service to ensure the patient's safety. The patient's presenting symptoms, physical exam findings, and initial radiographic and laboratory data in the context of their medical condition is felt to place them at decreased risk for further clinical deterioration. Furthermore, it is anticipated that the patient will be medically stable for discharge from  the hospital within 2 midnights of admission.   Author: Andris Baumann, MD 12/09/2022 3:34 AM  For on call review [email or web address].

## 2022-12-09 NOTE — Discharge Summary (Signed)
Physician Discharge Summary   [address] [place]  male DOB: 02/15/1924  UUV:[id number]  PCP: Duanne Limerick, MD  Admit date: 12/09/2022 Discharge date: 12/09/2022  Admitted From: home Disposition:  home Family updated on the phone prior to discharge. Home Health: Yes CODE STATUS: Full code   Hospital Course:  For full details, please see H&P, progress notes, consult notes and ancillary notes.  Briefly,  [address] Jerone Cudmore is a 89 y.o. 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  He states the bed felt like it was spinning.     Reviewing PCP records, patient was seen about 3 weeks prior on 11/15/2022 with a complaint of dizziness and feeling swimmy headed and was diagnosed with dehydration and instructed to drink more water..  Niece at bedside confirms that his symptoms started 3 weeks prior and states that she has been staying with him since the onset of the symptoms.  She states that he stays hydrated with Gatorade but notices that the symptoms have been on and off, but specifically when he changes position.    He denied visual disturbance, headache, weakness numbness or tingling in the extremities.  He had no lightheadedness, palpitations or chest pain.   * Vertigo CT head showed chronic small vessel ischemic disease and chronic infarcts Received meclizine in the ED without effect. US carotid no significant stenosis and Normal vertebral waveforms.  PT eval, pt was able to stand and ambulate without complaint of increased dizziness.  Pt was discharged home with [facility] and family support.   Essential hypertension Continue amlodipine and losartan.    Chronic anemia Stable   Presence of cardiac pacemaker No acute issues suspected   Benign prostatic hyperplasia Continue tamsulosin   Hyperlipidemia, mixed Continue  simvastatin   Discharge Diagnoses:  Principal Problem:   Vertigo Active Problems:   Essential hypertension   Hyperlipidemia, mixed   Benign prostatic hyperplasia   Presence of cardiac pacemaker   Chronic anemia     Discharge Instructions:  Allergies as of 12/09/2022   No Known Allergies      Medication List     STOP taking these medications    traMADol 50 MG tablet Commonly known as: ULTRAM       TAKE these medications    acetaminophen 325 MG tablet Commonly known as: TYLENOL Take 650 mg by mouth every 6 (six) hours as needed.   amLODipine 5 MG tablet Commonly known as: NORVASC Take 1 tablet (5 mg total) by mouth 2 (two) times daily.   brimonidine 0.2 % ophthalmic solution Commonly known as: ALPHAGAN Place 1 drop into both eyes 3 (three) times daily.   diclofenac Sodium 1 % Gel Commonly known as: VOLTAREN Apply 2 g topically 4 (four) times daily as needed. To affected joint.   fluticasone 50 MCG/ACT nasal spray Commonly known as: FLONASE USE 1 SPRAY IN EACH NOSTRIL EVERY DAY (NEED MD APPOINTMENT)   latanoprost 0.005 % ophthalmic solution Commonly known as: XALATAN Place 1 drop into both eyes at bedtime.   losartan 100 MG tablet Commonly known as: COZAAR Take 1 tablet (100 mg total) by mouth daily.   omeprazole 20 MG capsule Commonly known as: PRILOSEC Take 1 capsule (20 mg total) by mouth daily.   simvastatin 40 MG tablet Commonly known as: ZOCOR Take 1 tablet (40 mg total) by mouth at  bedtime.   tamsulosin 0.4 MG Caps capsule Commonly known as: FLOMAX Take 1 capsule (0.4 mg total) by mouth daily.   timolol 0.25 % ophthalmic solution Commonly known as: TIMOPTIC Place 1 drop into both eyes 2 (two) times daily. What changed: Another medication with the same name was removed. Continue taking this medication, and follow the directions you see here.         Follow-up Information     Duanne Limerick, MD Follow up in 1 week(s).    Specialty: Family Medicine Contact information: [address] [place] [address] [phone]                 No Known Allergies   The results of significant diagnostics from this hospitalization (including imaging, microbiology, ancillary and laboratory) are listed below for reference.   Consultations:   Procedures/Studies: ECHOCARDIOGRAM COMPLETE  Result Date: 12/09/2022    ECHOCARDIOGRAM REPORT   Patient Name:   ST CLAIR Peschke Date of Exam: 12/09/2022 Medical Rec #:  [id number]       Height:       72.0 in Accession #:    [id number]      Weight:       140.0 lb Date of Birth:  04-Nov-1923       BSA:          1.831 m Patient Age:    89 years        BP:           186/91 mmHg Patient Gender: M               HR:           75 bpm. Exam Location:  [facility] Procedure: 2D Echo, Color Doppler and Cardiac Doppler Indications:     TIA G45.9  History:         Patient has no prior history of Echocardiogram examinations.                  Pacemaker; Risk Factors:Hypertension.  Sonographer:     Cristela Blue Referring Phys:  [id number] Andris Baumann Diagnosing Phys: Debbe Odea MD  Sonographer Comments: Image acquisition challenging due to patient body habitus. IMPRESSIONS  1. Left ventricular ejection fraction, by estimation, is 60 to 65%. The left ventricle has normal function. The left ventricle has no regional wall motion abnormalities. There is mild left ventricular hypertrophy. Left ventricular diastolic parameters are consistent with Grade I diastolic dysfunction (impaired relaxation).  2. Right ventricular systolic function is normal. The right ventricular size is normal.  3. The mitral valve is normal in structure. No evidence of mitral valve regurgitation.  4. Tricuspid valve regurgitation is moderate to severe.  5. The aortic valve is tricuspid. Aortic valve regurgitation is not visualized.  6. Aortic dilatation noted. There is mild dilatation of the aortic root, measuring 40 mm.   7. The inferior vena cava is normal in size with greater than 50% respiratory variability, suggesting right atrial pressure of 3 mmHg. FINDINGS  Left Ventricle: Left ventricular ejection fraction, by estimation, is 60 to 65%. The left ventricle has normal function. The left ventricle has no regional wall motion abnormalities. The left ventricular internal cavity size was normal in size. There is  mild left ventricular hypertrophy. Left ventricular diastolic parameters are consistent with Grade I diastolic dysfunction (impaired relaxation). Right Ventricle: The right ventricular size is normal. No increase in right ventricular wall thickness. Right ventricular systolic function is normal. Left  Atrium: Left atrial size was normal in size. Right Atrium: Right atrial size was normal in size. Pericardium: There is no evidence of pericardial effusion. Mitral Valve: The mitral valve is normal in structure. No evidence of mitral valve regurgitation. MV peak gradient, 5.5 mmHg. The mean mitral valve gradient is 1.0 mmHg. Tricuspid Valve: The tricuspid valve is normal in structure. Tricuspid valve regurgitation is moderate to severe. Aortic Valve: The aortic valve is tricuspid. Aortic valve regurgitation is not visualized. Aortic valve mean gradient measures 2.0 mmHg. Aortic valve peak gradient measures 3.6 mmHg. Aortic valve area, by VTI measures 3.00 cm. Pulmonic Valve: The pulmonic valve was normal in structure. Pulmonic valve regurgitation is mild. Aorta: Aortic dilatation noted. There is mild dilatation of the aortic root, measuring 40 mm. Venous: The inferior vena cava is normal in size with greater than 50% respiratory variability, suggesting right atrial pressure of 3 mmHg. IAS/Shunts: No atrial level shunt detected by color flow Doppler. Additional Comments: A device lead is visualized.  LEFT VENTRICLE PLAX 2D LVIDd:         4.80 cm   Diastology LVIDs:         2.90 cm   LV e' medial:    4.68 cm/s LV PW:         1.20  cm   LV E/e' medial:  11.9 LV IVS:        1.20 cm   LV e' lateral:   5.11 cm/s LVOT diam:     2.20 cm   LV E/e' lateral: 10.9 LV SV:         60 LV SV Index:   33 LVOT Area:     3.80 cm  RIGHT VENTRICLE RV Basal diam:  4.50 cm RV Mid diam:    3.40 cm LEFT ATRIUM             Index        RIGHT ATRIUM           Index LA diam:        3.40 cm 1.86 cm/m   RA Area:     20.90 cm LA Vol (A2C):   69.3 ml 37.85 ml/m  RA Volume:   60.30 ml  32.93 ml/m LA Vol (A4C):   36.2 ml 19.77 ml/m LA Biplane Vol: 53.1 ml 29.00 ml/m  AORTIC VALVE AV Area (Vmax):    2.59 cm AV Area (Vmean):   3.02 cm AV Area (VTI):     3.00 cm AV Vmax:           94.90 cm/s AV Vmean:          58.800 cm/s AV VTI:            0.200 m AV Peak Grad:      3.6 mmHg AV Mean Grad:      2.0 mmHg LVOT Vmax:         64.70 cm/s LVOT Vmean:        46.700 cm/s LVOT VTI:          0.158 m LVOT/AV VTI ratio: 0.79  AORTA Ao Root diam: 3.90 cm MITRAL VALVE               TRICUSPID VALVE MV Area (PHT): 2.94 cm    TR Peak grad:   31.6 mmHg MV Area VTI:   2.49 cm    TR Vmax:        281.00 cm/s MV Peak grad:  5.5 mmHg MV Mean grad:  1.0 mmHg    SHUNTS MV Vmax:       1.17 m/s    Systemic VTI:  0.16 m MV Vmean:      54.1 cm/s   Systemic Diam: 2.20 cm MV Decel Time: 258 msec MV E velocity: 55.50 cm/s MV A velocity: 88.60 cm/s MV E/A ratio:  0.63 Debbe Odea MD Electronically signed by Debbe Odea MD Signature Date/Time: 12/09/2022/1:36:05 PM    Final    US Carotid Bilateral (at [facility] and [facility] only)  Result Date: 12/09/2022 CLINICAL DATA:  TIA. EXAM: BILATERAL CAROTID DUPLEX ULTRASOUND TECHNIQUE: Wallace Cullens scale imaging, color Doppler and duplex ultrasound were performed of bilateral carotid and vertebral arteries in the neck. COMPARISON:  None Available. FINDINGS: Criteria: Quantification of carotid stenosis is based on velocity parameters that correlate the residual internal carotid diameter with NASCET-based stenosis levels, using the diameter of the distal internal  carotid lumen as the denominator for stenosis measurement. The following velocity measurements were obtained: RIGHT ICA: 53/10 cm/sec CCA: 68/9 cm/sec SYSTOLIC ICA/CCA RATIO:  1 ECA: 45 cm/sec LEFT ICA: 60/12 cm/sec CCA: 104/10 cm/sec SYSTOLIC ICA/CCA RATIO: 0.8 ECA: 47 cm/sec RIGHT CAROTID ARTERY: Atheromatous plaque and vessel tortuosity. RIGHT VERTEBRAL ARTERY:  Antegrade flow with normal waveform LEFT CAROTID ARTERY:  Atheromatous plaque and vessel tortuosity. LEFT VERTEBRAL ARTERY:  Antegrade flow with normal waveform. IMPRESSION: Atherosclerosis without evidence of hemodynamically significant stenosis in either cervical carotid. Normal vertebral waveforms. Electronically Signed   By: Tiburcio Pea M.D.   On: 12/09/2022 06:08   CT HEAD WO CONTRAST  Result Date: 12/09/2022 CLINICAL DATA:  Vertigo EXAM: CT HEAD WITHOUT CONTRAST TECHNIQUE: Contiguous axial images were obtained from the base of the skull through the vertex without intravenous contrast. RADIATION DOSE REDUCTION: This exam was performed according to the departmental dose-optimization program which includes automated exposure control, adjustment of the mA and/or kV according to patient size and/or use of iterative reconstruction technique. COMPARISON:  CT head 07/29/2008 FINDINGS: Brain: No intracranial hemorrhage, mass effect, or evidence of acute infarct. No hydrocephalus. No extra-axial fluid collection. Generalized cerebral atrophy. Ill-defined hypoattenuation within the cerebral white matter is nonspecific but consistent with chronic small vessel ischemic disease. Chronic infarcts. Physiologic basal ganglia calcifications Vascular: No hyperdense vessel. Intracranial arterial calcification. Skull: No fracture or focal lesion. Sinuses/Orbits: Chronic left maxillary sinusitis. Other: None. IMPRESSION: 1. No acute intracranial abnormality. 2. Chronic small vessel ischemic disease and chronic infarcts. Electronically Signed   By: Minerva Fester  M.D.   On: 12/09/2022 01:51      Labs: BNP (last 3 results) No results for input(s): "BNP" in the last 8760 hours. Basic Metabolic Panel: Recent Labs  Lab 12/09/22 0117  NA 136  K 3.7  CL 105  CO2 25  GLUCOSE 114*  BUN 26*  CREATININE 1.15  CALCIUM 8.6*   Liver Function Tests: Recent Labs  Lab 12/09/22 0117  AST 19  ALT 13  ALKPHOS 39  BILITOT 0.4  PROT 7.0  ALBUMIN 3.5   No results for input(s): "LIPASE", "AMYLASE" in the last 168 hours. No results for input(s): "AMMONIA" in the last 168 hours. CBC: Recent Labs  Lab 12/09/22 0117  WBC 4.0  NEUTROABS 1.8  HGB 11.4*  HCT 33.8*  MCV 88.0  PLT 204   Cardiac Enzymes: No results for input(s): "CKTOTAL", "CKMB", "CKMBINDEX", "TROPONINI" in the last 168 hours. BNP: Invalid input(s): "POCBNP" CBG: No results for input(s): "GLUCAP" in the last 168 hours. D-Dimer No results for input(s): "DDIMER" in  the last 72 hours. Hgb A1c No results for input(s): "HGBA1C" in the last 72 hours. Lipid Profile Recent Labs    12/09/22 0629  CHOL 140  HDL 64  LDLCALC 66  TRIG 48  CHOLHDL 2.2   Thyroid function studies No results for input(s): "TSH", "T4TOTAL", "T3FREE", "THYROIDAB" in the last 72 hours.  Invalid input(s): "FREET3" Anemia work up No results for input(s): "VITAMINB12", "FOLATE", "FERRITIN", "TIBC", "IRON", "RETICCTPCT" in the last 72 hours. Urinalysis    Component Value Date/Time   COLORURINE STRAW (A) 12/09/2022 0422   APPEARANCEUR CLEAR (A) 12/09/2022 0422   APPEARANCEUR Clear 11/01/2011 1635   LABSPEC 1.006 12/09/2022 0422   LABSPEC 1.013 11/01/2011 1635   PHURINE 7.0 12/09/2022 0422   GLUCOSEU NEGATIVE 12/09/2022 0422   GLUCOSEU Negative 11/01/2011 1635   HGBUR NEGATIVE 12/09/2022 0422   BILIRUBINUR NEGATIVE 12/09/2022 0422   BILIRUBINUR neg 04/20/2021 1522   BILIRUBINUR Negative 11/01/2011 1635   KETONESUR NEGATIVE 12/09/2022 0422   PROTEINUR NEGATIVE 12/09/2022 0422   UROBILINOGEN 2.0  (A) 04/20/2021 1522   NITRITE NEGATIVE 12/09/2022 0422   LEUKOCYTESUR NEGATIVE 12/09/2022 0422   LEUKOCYTESUR Negative 11/01/2011 1635   Sepsis Labs Recent Labs  Lab 12/09/22 0117  WBC 4.0   Microbiology No results found for this or any previous visit (from the past 240 hour(s)).   Total time spend on discharging this patient, including the last patient exam, discussing the hospital stay, instructions for ongoing care as it relates to all pertinent caregivers, as well as preparing the medical discharge records, prescriptions, and/or referrals as applicable, is 40 minutes.    Darlin Priestly, MD  [facility] 12/09/2022, 2:55 PM

## 2022-12-09 NOTE — Evaluation (Addendum)
Physical Therapy Evaluation Patient Details Name: Eric [name] MRN: [id number] DOB: Nov 24, 1923 Today's Date: 12/09/2022  History of Present Illness  Pt is a 89 y.o. 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 Pt admitted to ED (7/18) with concerns of dizziness/vertigo.  Clinical Impression  Pt standing near bed trying to walk to sink upon arrival, helped back to bed. Pt reported still experiencing some dizziness symptoms however was unable to give specifics as to what movements/positions cause it. His niece arrived halfway through session and reported he has been feeling "dizzy" for the past 3 weeks. She says he mostly experiences it when getting up from bed or standing up from a recliner/chair. Pt reports it usually takes around 30 minutes for symptoms to subside. Pt's vitals in sitting as follows: BP 153/115, HR 80 bpm, O2 96% on room air. Pt needed min A to stand from ED bed for elevation; he had a slight posterior lean upon immediate standing requiring min A to correct. Pt didn't report an increase in dizziness symptoms with standing. Once standing pt able to ambulate ~50 feet with RW and min guard for safety; continued slight posterior lean with walking however he had no instances of LOB. Pt's vitals remained WNL throughout the session. Pt will benefit from continued PT services upon discharge to safely address deficits listed in patient problem list for decreased caregiver assistance and eventual return to PLOF.       Assistance Recommended at Discharge Intermittent Supervision/Assistance  If plan is discharge home, recommend the following:  Can travel by private vehicle  A little help with walking and/or transfers;A little help with bathing/dressing/bathroom;Assistance with cooking/housework;Direct supervision/assist for financial management;Assist for transportation;Help with stairs or ramp for entrance;Direct supervision/assist for  medications management        Equipment Recommendations None recommended by PT  Recommendations for Other Services       Functional Status Assessment Patient has had a recent decline in their functional status and demonstrates the ability to make significant improvements in function in a reasonable and predictable amount of time.     Precautions / Restrictions Precautions Precautions: Fall Restrictions Weight Bearing Restrictions: No      Mobility  Bed Mobility Overal bed mobility: Modified Independent             General bed mobility comments: Pt mod I for all bed mobility tasks. No physical assistance or cueing needed.    Transfers Overall transfer level: Needs assistance Equipment used: Rolling walker (2 wheels) Transfers: Sit to/from Stand Sit to Stand: Min assist           General transfer comment: Min A for STS from ED bed for elevation, increased time to stand EOB; posterior lean upon immediate standing.    Ambulation/Gait Ambulation/Gait assistance: Min guard Gait Distance (Feet): 50 Feet Assistive device: Rolling walker (2 wheels) Gait Pattern/deviations: Step-through pattern, Decreased step length - right, Decreased step length - left, Leaning posteriorly Gait velocity: decreased     General Gait Details: Pt able to ambulate ~50 feet with RW and min guard for safety. He demonstrated a slight posterior lean while walking however didn't have any instances of losing his balance and didn't need any physical assistance.  Stairs            Wheelchair Mobility     Tilt Bed    Modified Rankin (Stroke Patients Only)       Balance Overall balance assessment: Needs assistance Sitting-balance support: Feet  supported, No upper extremity supported Sitting balance-Leahy Scale: Good Sitting balance - Comments: Pt sat EOB with no UE support with no LOB Postural control: Posterior lean   Standing balance-Leahy Scale: Fair Standing balance  comment: Slight posterior lean with immediate standing and ambulating. Pt able to stand EOB with no AD for 10-15 seconds with no LOB                             Pertinent Vitals/Pain Pain Assessment Pain Assessment: No/denies pain    Home Living Family/patient expects to be discharged to:: Private residence Living Arrangements: Alone Available Help at Discharge: Family (Niece helps from 10 am - 5 pm everyday) Type of Home: House Home Access: Level entry       Home Layout: One level Home Equipment: Agricultural consultant (2 wheels);Cane - single point      Prior Function Prior Level of Function : Independent/Modified Independent             Mobility Comments: Pt independent with mobility, uses SPC most of time but also has RW. Limited community ambulator. 3 falls in last week secondary to getting dizzy and legs giving out. ADLs Comments: Independent with all ADLs, niece says he's able to get up, get dressed, cook breakfast, etc. independently. Ind. driving, running errands.     Hand Dominance        Extremity/Trunk Assessment   Upper Extremity Assessment Upper Extremity Assessment: Defer to OT evaluation    Lower Extremity Assessment Lower Extremity Assessment: Generalized weakness       Communication   Communication: No difficulties  Cognition Arousal/Alertness: Awake/alert Behavior During Therapy: WFL for tasks assessed/performed Overall Cognitive Status: History of cognitive impairments - at baseline                                 General Comments: Pt A+O x4, could follow simple commands consistently. Had trouble answering questions relating to history, dizziness with specificity; would perseverate on unrelated topics.        General Comments General comments (skin integrity, edema, etc.): No formal Dix-Hallpike testing completed. Pt's subjective report did not seem indicative of BPPV: he had low grade persistent dizziness t/o the  session, did not report any consistency of symptoms with any head/body positional changes, did not have nausea or reports of the room spinning, no nystagmus seen.    Exercises     Assessment/Plan    PT Assessment Patient needs continued PT services  PT Problem List Decreased strength;Decreased activity tolerance;Decreased balance;Decreased safety awareness       PT Treatment Interventions DME instruction;Therapeutic exercise;Balance training;Gait training;Stair training;Functional mobility training;Therapeutic activities;Patient/family education    PT Goals (Current goals can be found in the Care Plan section)  Acute Rehab PT Goals PT Goal Formulation: Patient unable to participate in goal setting    Frequency Min 1X/week     Co-evaluation               AM-PAC PT "6 Clicks" Mobility  Outcome Measure Help needed turning from your back to your side while in a flat bed without using bedrails?: None Help needed moving from lying on your back to sitting on the side of a flat bed without using bedrails?: A Little Help needed moving to and from a bed to a chair (including a wheelchair)?: A Little Help needed standing up from a chair using  your arms (e.g., wheelchair or bedside chair)?: A Little Help needed to walk in hospital room?: A Little Help needed climbing 3-5 steps with a railing? : A Lot 6 Click Score: 18    End of Session Equipment Utilized During Treatment: Gait belt Activity Tolerance: Patient tolerated treatment well Patient left: in bed;with family/visitor present;with bed alarm set;with call bell/phone within reach Nurse Communication: Mobility status PT Visit Diagnosis: Unsteadiness on feet (R26.81);History of falling (Z91.81);Repeated falls (R29.6);Muscle weakness (generalized) (M62.81);Dizziness and giddiness (R42)    Time: 1191-4782 PT Time Calculation (min) (ACUTE ONLY): 39 min   Charges:   PT Evaluation $PT Eval Low Complexity: 1 Low PT  Treatments $Gait Training: 8-22 mins PT General Charges $$ ACUTE PT VISIT: 1 Visit         Timothy Crutchfield, SPT 12/09/22, 1:55 PM This entire session was performed under direct supervision and direction of a licensed therapist/therapist assistant. I have personally read, edited and approve of the note as written.  Loran Senters, DPT

## 2022-12-09 NOTE — Assessment & Plan Note (Signed)
Stable

## 2022-12-09 NOTE — Assessment & Plan Note (Addendum)
Peripheral versus central.  Positional. Symptom onset 3 weeks prior CT head showed chronic small vessel ischemic disease and chronic infarcts Received meclizine in the ED without effect Will get neurology consult Fall precautions and out of bed with assistance only Continue meclizine for now

## 2022-12-09 NOTE — Assessment & Plan Note (Signed)
Continue tamsulosin.

## 2022-12-09 NOTE — Progress Notes (Addendum)
SLP Cancellation Note  Patient Details Name: Eric [name] MRN: [id number] DOB: April 16, 1924   Cancelled treatment:       Reason Eval/Treat Not Completed: SLP screened, no needs identified, will sign off (chart reviewed; consulted NSG then met w/ pt in room)   During this visit, noted pt is HOH.  Noted Head CT results.  Pt denied any difficulty swallowing and is currently on a regular diet; tolerates swallowing pills w/ water per NSG. Pt asked for "hot" coffee(provided by this SLP) and consumed it and some of his banana during the visit w/out deficits noted.   Pt conversed in basic conversation w/out expressive/receptive deficits noted; pt denied any speech-language deficits. Speech clear, intelligible. Pt was able to id foods he wanted for his Lunch meal(ordered). He also id his Niece who help to take care of him -- "they all check on me every day since my Wife went in the NH". He described that they get items from the store for him when he needs something. He endorsed he receives Meals on Wheels and "can warm up the food in the microwave when I get ready to eat it". He also stated he dresses himself w/out difficulty. No further skilled ST services indicated as pt appears at his communication baseline at 89yo. Pt agreed. NSG to reconsult if any change in status while admitted.      Eric Som, MS, CCC-SLP Speech Language Pathologist Rehab Services; [facility] [phone] (ascom) Alondra Sahni 12/09/2022, 11:54 AM

## 2022-12-09 NOTE — ED Triage Notes (Signed)
Patient BIB EMS for evaluation of dizziness when laying in bed tonight.  Reports the bed felt like it was "spinning."  No reports of weakness, SHOB, confusion, or pain.  No dizziness with standing.

## 2022-12-09 NOTE — Assessment & Plan Note (Addendum)
No acute issues suspected 

## 2022-12-09 NOTE — TOC Initial Note (Addendum)
Transition of Care [facility]) - Initial/Assessment Note    Patient Details  Name: Eric [name] MRN: [id number] Date of Birth: [birth year]  Transition of [facility]) CM/SW Contact:    Kreg Shropshire, RN Phone Number: 12/09/2022, 3:43 PM  Clinical Narrative:                 Jayme Cloud consult for [facility] PT. Cm spoke with daughter shon regarding pt. She stated her dad lives alone but has neices and that live with him. His main contact is niece [place]. His other family members live nearby. Cm asked about preference in regards to home health. She stated she didn't have any preference. Cm reached out to [facility] in regards to [facility] PT. He agreed to take pt. Cm also asked about ay DME. Daughter stated he does not. MD order RW. Cm sent order to [facility] of [employer] about RW order to be delivered to home address. Family will be providing transport when d/c home.  Expected Discharge Plan: Home w Home Health Services Barriers to Discharge: Continued Medical Work up   Patient Goals and CMS Choice            Expected Discharge Plan and Services       Living arrangements for the past 2 months: Single Family Home Expected Discharge Date: 12/09/22                         HH Arranged: PT, OT HH Agency: [facility] Date [facility] Agency Contacted: 12/09/22 Time HH Agency Contacted: [id number] Representative spoke with at [facility] Agency: Lorenza Chick  Prior Living Arrangements/Services Living arrangements for the past 2 months: Single Family Home Lives with:: Self, Other (Comment) Patient language and need for interpreter reviewed:: Yes Do you feel safe going back to the place where you live?: Yes      Need for Family Participation in Patient Care: Yes (Comment) Care giver support system in place?: Yes (comment)      Activities of Daily Living      Permission Sought/Granted            Permission granted to share info w Relationship: Shon daughter  Permission granted to share info w  Contact Information: Shon daughter  Emotional Assessment              Admission diagnosis:  Vertigo [R42] Patient Active Problem List   Diagnosis Date Noted   Vertigo 12/09/2022   Chronic anemia 12/09/2022   Primary osteoarthritis of right knee 12/11/2021   Other chest pain 10/02/2018   Presence of cardiac pacemaker 01/04/2018   Symptomatic bradycardia 12/08/2017   Abnormal EKG 11/02/2017   Bradycardia by electrocardiogram 11/02/2017   Shortness of breath 11/02/2017   Essential hypertension 10/22/2015   Hyperlipidemia, mixed 10/22/2015   Benign prostatic hyperplasia 10/22/2015   Arthritis, senescent 12/05/2013   Benign essential HTN 02/14/2013   Increased frequency of urination 05/22/2012   Nocturia 05/22/2012   Personal history of prostate cancer 05/22/2012   PCP:  Duanne Limerick, MD Pharmacy:   [facility] - [place], [place] - [address] [place] [address] Phone: [phone] Fax: [phone]  [facility] - [place], [place] - [address] [place] [address] Phone: [phone] Fax: [phone]  [id number] [place], [place] - [address] AT [facility] [address] [place]  [place] [address] Phone: [phone] Fax: [phone]     Social Determinants of Health (SDOH) Social History: SDOH Screenings   Food Insecurity: No Food Insecurity (07/08/2022)  Housing: Low Risk  (07/08/2022)  Transportation Needs: No Transportation Needs (07/08/2022)  Utilities: Not At Risk (07/08/2022)  Alcohol Screen: Low Risk  (07/08/2022)  Depression (PHQ2-9): Low Risk  (07/08/2022)  Financial Resource Strain: Low Risk  (07/08/2022)  Physical Activity: Insufficiently Active (07/08/2022)  Social Connections: Moderately Integrated (07/08/2022)  Stress: No Stress Concern Present (07/08/2022)  Tobacco Use: Low Risk  (12/09/2022)   SDOH Interventions:     Readmission Risk Interventions     No  data to display

## 2022-12-10 ENCOUNTER — Ambulatory Visit: Payer: Medicare HMO | Admitting: Family Medicine

## 2022-12-10 LAB — HEMOGLOBIN A1C
Hgb A1c MFr Bld: 6.3 % — ABNORMAL HIGH (ref 4.8–5.6)
Mean Plasma Glucose: 134 mg/dL

## 2022-12-15 ENCOUNTER — Telehealth: Payer: Self-pay | Admitting: Family Medicine

## 2022-12-15 NOTE — Telephone Encounter (Signed)
Already routed.  °

## 2022-12-16 ENCOUNTER — Telehealth: Payer: Self-pay | Admitting: Family Medicine

## 2022-12-16 NOTE — Telephone Encounter (Signed)
routed

## 2022-12-23 ENCOUNTER — Encounter: Payer: Self-pay | Admitting: Family Medicine

## 2022-12-23 ENCOUNTER — Ambulatory Visit (INDEPENDENT_AMBULATORY_CARE_PROVIDER_SITE_OTHER): Payer: Medicare HMO | Admitting: Family Medicine

## 2022-12-23 VITALS — BP 138/68 | HR 72 | Ht 72.0 in | Wt 142.0 lb

## 2022-12-23 DIAGNOSIS — D649 Anemia, unspecified: Secondary | ICD-10-CM

## 2022-12-23 DIAGNOSIS — N401 Enlarged prostate with lower urinary tract symptoms: Secondary | ICD-10-CM

## 2022-12-23 DIAGNOSIS — J301 Allergic rhinitis due to pollen: Secondary | ICD-10-CM | POA: Diagnosis not present

## 2022-12-23 DIAGNOSIS — E785 Hyperlipidemia, unspecified: Secondary | ICD-10-CM | POA: Diagnosis not present

## 2022-12-23 DIAGNOSIS — K219 Gastro-esophageal reflux disease without esophagitis: Secondary | ICD-10-CM | POA: Diagnosis not present

## 2022-12-23 DIAGNOSIS — I10 Essential (primary) hypertension: Secondary | ICD-10-CM

## 2022-12-23 MED ORDER — OMEPRAZOLE 20 MG PO CPDR
20.0000 mg | DELAYED_RELEASE_CAPSULE | Freq: Every day | ORAL | 1 refills | Status: DC
Start: 2022-12-23 — End: 2023-04-18

## 2022-12-23 MED ORDER — FLUTICASONE PROPIONATE 50 MCG/ACT NA SUSP
NASAL | 1 refills | Status: DC
Start: 2022-12-23 — End: 2023-06-16

## 2022-12-23 MED ORDER — SIMVASTATIN 40 MG PO TABS
40.0000 mg | ORAL_TABLET | Freq: Every day | ORAL | 1 refills | Status: DC
Start: 2022-12-23 — End: 2023-04-18

## 2022-12-23 MED ORDER — AMLODIPINE BESYLATE 5 MG PO TABS
5.0000 mg | ORAL_TABLET | Freq: Two times a day (BID) | ORAL | 1 refills | Status: DC
Start: 2022-12-23 — End: 2023-04-12

## 2022-12-23 MED ORDER — TAMSULOSIN HCL 0.4 MG PO CAPS
0.4000 mg | ORAL_CAPSULE | Freq: Every day | ORAL | 1 refills | Status: DC
Start: 2022-12-23 — End: 2023-04-18

## 2022-12-23 MED ORDER — LOSARTAN POTASSIUM 100 MG PO TABS
100.0000 mg | ORAL_TABLET | Freq: Every day | ORAL | 1 refills | Status: DC
Start: 2022-12-23 — End: 2023-04-18

## 2022-12-23 NOTE — Progress Notes (Signed)
Date:  12/23/2022   Name:  Eric [name]   DOB:  07-07-1923   MRN:  [id number]   Chief Complaint: Hypertension, Hyperlipidemia, Gastroesophageal Reflux, Benign Prostatic Hypertrophy, and Allergic Rhinitis   Hypertension This is a chronic problem. The current episode started more than 1 year ago. The problem has been gradually improving since onset. The problem is controlled. Pertinent negatives include no anxiety, blurred vision, chest pain, headaches, malaise/fatigue, neck pain, orthopnea, palpitations, peripheral edema, PND, shortness of breath or sweats. There are no associated agents to hypertension. There are no known risk factors for coronary artery disease. Past treatments include angiotensin blockers and calcium channel blockers. The current treatment provides moderate improvement. There are no compliance problems.  There is no history of angina, kidney disease, CAD/MI, CVA, heart failure, left ventricular hypertrophy, PVD or retinopathy. There is no history of chronic renal disease, a hypertension causing med or renovascular disease.  Hyperlipidemia This is a chronic problem. The current episode started more than 1 year ago. The problem is controlled. He has no history of chronic renal disease or diabetes. Pertinent negatives include no chest pain, focal sensory loss, leg pain or shortness of breath. Current antihyperlipidemic treatment includes statins. The current treatment provides moderate improvement of lipids. There are no compliance problems.  Risk factors for coronary artery disease include dyslipidemia and hypertension.  Gastroesophageal Reflux He reports no abdominal pain, no belching, no chest pain, no dysphagia, no globus sensation, no heartburn, no hoarse voice, no nausea or no wheezing. This is a chronic problem. The current episode started more than 1 year ago. Pertinent negatives include no fatigue or melena. He has tried a PPI for the symptoms.  Benign Prostatic  Hypertrophy This is a chronic problem. The current episode started more than 1 year ago. The problem has been gradually improving since onset. Pertinent negatives include no chills, genital pain or nausea.    Lab Results  Component Value Date   NA 136 12/09/2022   K 3.7 12/09/2022   CO2 25 12/09/2022   GLUCOSE 114 (H) 12/09/2022   BUN 26 (H) 12/09/2022   CREATININE 1.15 12/09/2022   CALCIUM 8.6 (L) 12/09/2022   EGFR 60 12/01/2021   GFRNONAA 58 (L) 12/09/2022   Lab Results  Component Value Date   CHOL 140 12/09/2022   HDL 64 12/09/2022   LDLCALC 66 12/09/2022   TRIG 48 12/09/2022   CHOLHDL 2.2 12/09/2022   No results found for: "TSH" Lab Results  Component Value Date   HGBA1C 6.3 (H) 12/09/2022   Lab Results  Component Value Date   WBC 4.0 12/09/2022   HGB 11.4 (L) 12/09/2022   HCT 33.8 (L) 12/09/2022   MCV 88.0 12/09/2022   PLT 204 12/09/2022   Lab Results  Component Value Date   ALT 13 12/09/2022   AST 19 12/09/2022   ALKPHOS 39 12/09/2022   BILITOT 0.4 12/09/2022   No results found for: "25OHVITD2", "25OHVITD3", "VD25OH"   Review of Systems  Constitutional:  Negative for chills, fatigue, malaise/fatigue and unexpected weight change.  HENT:  Negative for congestion, hoarse voice and nosebleeds.   Eyes:  Negative for blurred vision and visual disturbance.  Respiratory:  Negative for shortness of breath and wheezing.   Cardiovascular:  Negative for chest pain, palpitations, orthopnea and PND.  Gastrointestinal:  Negative for abdominal pain, anal bleeding, dysphagia, heartburn, melena and nausea.  Endocrine: Negative for polydipsia and polyuria.  Genitourinary:  Negative for difficulty urinating.  Musculoskeletal:  Negative  for neck pain.  Neurological:  Positive for dizziness. Negative for headaches.    Patient Active Problem List   Diagnosis Date Noted   Vertigo 12/09/2022   Chronic anemia 12/09/2022   Primary osteoarthritis of right knee 12/11/2021    Other chest pain 10/02/2018   Presence of cardiac pacemaker 01/04/2018   Symptomatic bradycardia 12/08/2017   Abnormal EKG 11/02/2017   Bradycardia by electrocardiogram 11/02/2017   Shortness of breath 11/02/2017   Essential hypertension 10/22/2015   Hyperlipidemia, mixed 10/22/2015   Benign prostatic hyperplasia 10/22/2015   Arthritis, senescent 12/05/2013   Benign essential HTN 02/14/2013   Increased frequency of urination 05/22/2012   Nocturia 05/22/2012   Personal history of prostate cancer 05/22/2012    No Known Allergies  Past Surgical History:  Procedure Laterality Date   EYE SURGERY     cataract extraction   HERNIA REPAIR Left 2014   inguinal hernia   PACEMAKER INSERTION Left 12/27/2017   Procedure: INSERTION PACEMAKER-INITIAL DUAL CHAMBER;  Surgeon: Marcina Millard, MD;  Location: [facility] ORS;  Service: Cardiovascular;  Laterality: Left;    Social History   Tobacco Use   Smoking status: Never   Smokeless tobacco: Never  Vaping Use   Vaping status: Never Used  Substance Use Topics   Alcohol use: No   Drug use: No     Medication list has been reviewed and updated.  Current Meds  Medication Sig   acetaminophen (TYLENOL) 325 MG tablet Take 650 mg by mouth every 6 (six) hours as needed.   amLODipine (NORVASC) 5 MG tablet Take 1 tablet (5 mg total) by mouth 2 (two) times daily.   brimonidine (ALPHAGAN) 0.2 % ophthalmic solution Place 1 drop into both eyes 3 (three) times daily.   diclofenac Sodium (VOLTAREN) 1 % GEL Apply 2 g topically 4 (four) times daily as needed. To affected joint.   fluticasone (FLONASE) 50 MCG/ACT nasal spray USE 1 SPRAY IN EACH NOSTRIL EVERY DAY (NEED MD APPOINTMENT)   latanoprost (XALATAN) 0.005 % ophthalmic solution Place 1 drop into both eyes at bedtime.   losartan (COZAAR) 100 MG tablet Take 1 tablet (100 mg total) by mouth daily.   omeprazole (PRILOSEC) 20 MG capsule Take 1 capsule (20 mg total) by mouth daily.   simvastatin (ZOCOR)  40 MG tablet Take 1 tablet (40 mg total) by mouth at bedtime.   tamsulosin (FLOMAX) 0.4 MG CAPS capsule Take 1 capsule (0.4 mg total) by mouth daily.   timolol (TIMOPTIC) 0.25 % ophthalmic solution Place 1 drop into both eyes 2 (two) times daily.       12/23/2022   10:19 AM 06/23/2022   10:09 AM 01/12/2022    9:46 AM 12/11/2021    9:45 AM  GAD 7 : Generalized Anxiety Score  Nervous, Anxious, on Edge 0 0 0 0  Control/stop worrying 0 0 0 0  Worry too much - different things 0 0 0 0  Trouble relaxing 0 0 0 0  Restless 0 0 0 0  Easily annoyed or irritable 0 0 0 0  Afraid - awful might happen 0 0 0 0  Total GAD 7 Score 0 0 0 0  Anxiety Difficulty Not difficult at all Not difficult at all Not difficult at all Not difficult at all       12/23/2022   10:19 AM 07/08/2022    8:40 AM 06/23/2022   10:09 AM  Depression screen PHQ 2/9  Decreased Interest 0 0 0  Down, Depressed, Hopeless 0  0 0  PHQ - 2 Score 0 0 0  Altered sleeping 0 0 0  Tired, decreased energy 0 0 0  Change in appetite 0 0 0  Feeling bad or failure about yourself  0 0 0  Trouble concentrating 0 0 0  Moving slowly or fidgety/restless 0 0 0  Suicidal thoughts 0 0 0  PHQ-9 Score 0 0 0  Difficult doing work/chores Not difficult at all Not difficult at all Not difficult at all    BP Readings from Last 3 Encounters:  12/23/22 138/68  12/09/22 (!) 162/76  11/15/22 (!) 142/70    Physical Exam Vitals and nursing note reviewed.  HENT:     Head: Normocephalic.     Right Ear: Tympanic membrane, ear canal and external ear normal. There is no impacted cerumen.     Left Ear: Tympanic membrane, ear canal and external ear normal. There is no impacted cerumen.     Nose: Nose normal. No congestion or rhinorrhea.     Mouth/Throat:     Mouth: Mucous membranes are moist.  Eyes:     General: No scleral icterus.       Right eye: No discharge.        Left eye: No discharge.     Conjunctiva/sclera: Conjunctivae normal.     Pupils:  Pupils are equal, round, and reactive to light.  Neck:     Thyroid: No thyromegaly.     Vascular: No JVD.     Trachea: No tracheal deviation.  Cardiovascular:     Rate and Rhythm: Normal rate and regular rhythm.     Heart sounds: Normal heart sounds. No murmur heard.    No friction rub. No gallop.  Pulmonary:     Effort: No respiratory distress.     Breath sounds: Normal breath sounds. No wheezing, rhonchi or rales.  Chest:     Chest wall: No tenderness.  Abdominal:     General: Bowel sounds are normal.     Palpations: Abdomen is soft. There is no mass.     Tenderness: There is no abdominal tenderness. There is no guarding or rebound.  Genitourinary:    Prostate: Normal. Not enlarged, not tender and no nodules present.     Rectum: Normal. Guaiac result negative. No mass.  Musculoskeletal:        General: No tenderness. Normal range of motion.     Cervical back: Normal range of motion and neck supple.  Lymphadenopathy:     Cervical: No cervical adenopathy.  Skin:    General: Skin is warm.     Findings: No rash.  Neurological:     Mental Status: He is alert and oriented to person, place, and time.     Cranial Nerves: No cranial nerve deficit.     Deep Tendon Reflexes: Reflexes are normal and symmetric.     Wt Readings from Last 3 Encounters:  12/23/22 142 lb (64.4 kg)  12/09/22 140 lb (63.5 kg)  11/15/22 143 lb (64.9 kg)    BP 138/68   Pulse 72   Ht 6' (1.829 m)   Wt 142 lb (64.4 kg)   SpO2 98%   BMI 19.26 kg/m   Assessment and Plan:  1. Essential hypertension Chronic.  Controlled.  Stable.  Blood pressure 138/68.  Continue amlodipine 5 mg twice a day and losartan 100 mg once a day.  Will recheck in 6 months. - amLODipine (NORVASC) 5 MG tablet; Take 1 tablet (5 mg total) by mouth  2 (two) times daily.  Dispense: 90 tablet; Refill: 1 - losartan (COZAAR) 100 MG tablet; Take 1 tablet (100 mg total) by mouth daily.  Dispense: 90 tablet; Refill: 1  2. Seasonal  allergic rhinitis due to pollen Chronic.  Controlled.  Stable.  Continue Flonase 1 spray each nostril daily. - fluticasone (FLONASE) 50 MCG/ACT nasal spray; USE 1 SPRAY IN EACH NOSTRIL EVERY DAY (NEED MD APPOINTMENT)  Dispense: 32 g; Refill: 1  3. Gastroesophageal reflux disease Chronic.  Controlled.  Stable.  Continue omeprazole 20 mg once a day. - omeprazole (PRILOSEC) 20 MG capsule; Take 1 capsule (20 mg total) by mouth daily.  Dispense: 90 capsule; Refill: 1  4. Hyperlipidemia, unspecified hyperlipidemia type Chronic.  Controlled.  Stable.  Continue simvastatin 40 mg 1 once a day.  Will check.  In 6 months - simvastatin (ZOCOR) 40 MG tablet; Take 1 tablet (40 mg total) by mouth at bedtime.  Dispense: 90 tablet; Refill: 1  5. Benign prostatic hyperplasia with lower urinary tract symptoms, symptom details unspecified Chronic.  Controlled.  Stable.  Continue Flomax 0.4 mg once a day. - tamsulosin (FLOMAX) 0.4 MG CAPS capsule; Take 1 capsule (0.4 mg total) by mouth daily.  Dispense: 90 capsule; Refill: 1  6. Chronic anemia Patient has a history of persistent low hemoglobin.  This may be due to multiple factors but we will look at correctable by checking his iron and B12 and folic acid to see if there is any issues with this.  Review of renal function is acceptable particularly for age.  Pending results - Iron, TIBC and Ferritin Panel - B12 and Folate Panel    Elizabeth Sauer, MD

## 2023-01-14 ENCOUNTER — Other Ambulatory Visit: Payer: Self-pay | Admitting: Family Medicine

## 2023-01-14 NOTE — Telephone Encounter (Signed)
Medication Refill - Medication: brimonidine (ALPHAGAN) 0.2 % ophthalmic solution [[id number]]   Has the patient contacted their pharmacy? Yes.    (Agent: If yes, when and what did the pharmacy advise?) Contact Provider   Preferred Pharmacy (with phone number or street name): [facility] in [place]   Has the patient been seen for an appointment in the last year OR does the patient have an upcoming appointment? Yes.    Agent: Please be advised that RX refills may take up to 3 business days. We ask that you follow-up with your pharmacy.

## 2023-01-14 NOTE — Telephone Encounter (Signed)
Requested medications are due for refill today.  unsure  Requested medications are on the active medications list.  yes  Last refill. 07/06/2019  Future visit scheduled.   yes  Notes to clinic.  Medication is historical.    Requested Prescriptions  Pending Prescriptions Disp Refills   brimonidine (ALPHAGAN) 0.2 % ophthalmic solution 5 mL     Sig: Place 1 drop into both eyes 3 (three) times daily.     Ophthalmology:  Glaucoma Passed - 01/14/2023 11:22 AM      Passed - Valid encounter within last 12 months    Recent Outpatient Visits           3 weeks ago Chronic anemia   [facility]   2 months ago Dehydration   [place] [facility]   3 months ago Essential hypertension   [place] [facility]   6 months ago Need for immunization against influenza   [facility]   1 year ago Primary osteoarthritis of right knee   [facility] at [facility], Ocie Bob, MD       Future Appointments             In 5 months Duanne Limerick, MD [facility] at [facility], [facility]

## 2023-01-20 DIAGNOSIS — H40153 Residual stage of open-angle glaucoma, bilateral: Secondary | ICD-10-CM | POA: Diagnosis not present

## 2023-01-20 DIAGNOSIS — H524 Presbyopia: Secondary | ICD-10-CM | POA: Diagnosis not present

## 2023-02-06 ENCOUNTER — Ambulatory Visit
Admission: EM | Admit: 2023-02-06 | Discharge: 2023-02-06 | Disposition: A | Discharge status: Home / Self Care | Payer: Medicare HMO | Attending: Physician Assistant | Admitting: Physician Assistant

## 2023-02-06 ENCOUNTER — Encounter: Payer: Self-pay | Admitting: Emergency Medicine

## 2023-02-06 DIAGNOSIS — R1084 Generalized abdominal pain: Secondary | ICD-10-CM | POA: Diagnosis not present

## 2023-02-06 DIAGNOSIS — H6123 Impacted cerumen, bilateral: Secondary | ICD-10-CM | POA: Diagnosis not present

## 2023-02-06 NOTE — ED Triage Notes (Signed)
Patient was eating [place] fried chicken for lunch.  Patient states that he was having stomach pain that lasted about 30 min  ago.  Patient denies N/V/D.  Patient denies any pain at this time.

## 2023-02-06 NOTE — ED Provider Notes (Signed)
[facility]    CSN: [id number] Arrival date & time: 02/06/23  1440      History   Chief Complaint Chief Complaint  Patient presents with   Abdominal Pain    HPI Eric [name] is a 89 y.o. male presenting with a family member for 2 separate complaints.  First they report that he had been having abdominal pain for the past couple of hours after eating [place] fried chicken.  However he has not had any abdominal pain for the past 30 minutes.  He reports feeling back to normal now.  He says he had 2 bowel movements which were normal soft stools and he has felt better since.  Denying any fever, fatigue, nausea/vomiting.  Has not taken any medication for the abdominal pain.  This time, he says he is feeling better and hoping to get more food.  Also, he is hoping to have cerumen impaction of bilateral ears flushed.  HPI  Past Medical History:  Diagnosis Date   Arthritis    knees   Dysrhythmia    bradycardia   GERD (gastroesophageal reflux disease)    Hard of hearing    has a hearing aid but has not worn   High cholesterol    History of kidney stones    Hypertension    Prostate disorder     Patient Active Problem List   Diagnosis Date Noted   Vertigo 12/09/2022   Chronic anemia 12/09/2022   Primary osteoarthritis of right knee 12/11/2021   Other chest pain 10/02/2018   Presence of cardiac pacemaker 01/04/2018   Symptomatic bradycardia 12/08/2017   Abnormal EKG 11/02/2017   Bradycardia by electrocardiogram 11/02/2017   Shortness of breath 11/02/2017   Essential hypertension 10/22/2015   Hyperlipidemia, mixed 10/22/2015   Benign prostatic hyperplasia 10/22/2015   Arthritis, senescent 12/05/2013   Benign essential HTN 02/14/2013   Increased frequency of urination 05/22/2012   Nocturia 05/22/2012   Personal history of prostate cancer 05/22/2012    Past Surgical History:  Procedure Laterality Date   EYE SURGERY     cataract extraction   HERNIA REPAIR  Left 2014   inguinal hernia   PACEMAKER INSERTION Left 12/27/2017   Procedure: INSERTION PACEMAKER-INITIAL DUAL CHAMBER;  Surgeon: Marcina Millard, MD;  Location: [facility];  Service: Cardiovascular;  Laterality: Left;       Home Medications    Prior to Admission medications   Medication Sig Start Date End Date Taking? Authorizing Provider  acetaminophen (TYLENOL) 325 MG tablet Take 650 mg by mouth every 6 (six) hours as needed.    [provider]  amLODipine (NORVASC) 5 MG tablet Take 1 tablet (5 mg total) by mouth 2 (two) times daily. 12/23/22   Duanne Limerick, MD  brimonidine (ALPHAGAN) 0.2 % ophthalmic solution Place 1 drop into both eyes 3 (three) times daily. 07/06/19   [provider]  diclofenac Sodium (VOLTAREN) 1 % GEL Apply 2 g topically 4 (four) times daily as needed. To affected joint. 12/11/21   Jerrol Banana, MD  fluticasone (FLONASE) 50 MCG/ACT nasal spray USE 1 SPRAY IN EACH NOSTRIL EVERY DAY (NEED MD APPOINTMENT) 12/23/22   Duanne Limerick, MD  latanoprost (XALATAN) 0.005 % ophthalmic solution Place 1 drop into both eyes at bedtime.    [provider]  losartan (COZAAR) 100 MG tablet Take 1 tablet (100 mg total) by mouth daily. 12/23/22   Duanne Limerick, MD  omeprazole (PRILOSEC) 20 MG capsule Take 1 capsule (  20 mg total) by mouth daily. 12/23/22   Duanne Limerick, MD  simvastatin (ZOCOR) 40 MG tablet Take 1 tablet (40 mg total) by mouth at bedtime. 12/23/22   Duanne Limerick, MD  tamsulosin (FLOMAX) 0.4 MG CAPS capsule Take 1 capsule (0.4 mg total) by mouth daily. 12/23/22   Duanne Limerick, MD  timolol (TIMOPTIC) 0.25 % ophthalmic solution Place 1 drop into both eyes 2 (two) times daily. 09/28/22   [provider]    Family History History reviewed. No pertinent family history.  Social History Social History   Tobacco Use   Smoking status: Never   Smokeless tobacco: Never  Vaping Use   Vaping status: Never Used  Substance Use Topics    Alcohol use: No   Drug use: No     Allergies   Patient has no known allergies.   Review of Systems Review of Systems  Constitutional:  Negative for appetite change, fatigue and fever.  HENT:  Positive for hearing loss.   Respiratory:  Negative for shortness of breath.   Cardiovascular:  Negative for chest pain.  Gastrointestinal:  Positive for abdominal pain (resolved). Negative for abdominal distention, anal bleeding, blood in stool, constipation, diarrhea, nausea, rectal pain and vomiting.     Physical Exam Triage Vital Signs ED Triage Vitals  Encounter Vitals Group     BP 02/06/23 1532 124/68     Systolic BP Percentile --      Diastolic BP Percentile --      Pulse Rate 02/06/23 1532 71     Resp 02/06/23 1532 16     Temp 02/06/23 1532 97.8 F (36.6 C)     Temp Source 02/06/23 1532 Oral     SpO2 02/06/23 1532 100 %     Weight 02/06/23 1529 141 lb 15.6 oz (64.4 kg)     Height 02/06/23 1529 6' (1.829 m)     Head Circumference --      Peak Flow --      Pain Score 02/06/23 1529 0     Pain Loc --      Pain Education --      Exclude from Growth Chart --    No data found.  Updated Vital Signs BP 124/68 (BP Location: Left Arm)   Pulse 71   Temp 97.8 F (36.6 C) (Oral)   Resp 16   Ht 6' (1.829 m)   Wt 141 lb 15.6 oz (64.4 kg)   SpO2 100%   BMI 19.26 kg/m    Physical Exam Vitals and nursing note reviewed.  Constitutional:      General: He is not in acute distress.    Appearance: Normal appearance. He is well-developed. He is not ill-appearing.  HENT:     Head: Normocephalic and atraumatic.     Right Ear: There is impacted cerumen.     Left Ear: There is impacted cerumen.     Nose: Nose normal.     Mouth/Throat:     Mouth: Mucous membranes are moist.     Pharynx: Oropharynx is clear.  Eyes:     General: No scleral icterus.    Conjunctiva/sclera: Conjunctivae normal.  Cardiovascular:     Rate and Rhythm: Normal rate and regular rhythm.     Heart  sounds: Normal heart sounds.  Pulmonary:     Effort: Pulmonary effort is normal. No respiratory distress.     Breath sounds: Normal breath sounds.  Abdominal:     General: Bowel sounds are  normal.     Palpations: Abdomen is soft.     Tenderness: There is no abdominal tenderness. There is no guarding or rebound.  Musculoskeletal:     Cervical back: Neck supple.  Skin:    General: Skin is warm and dry.     Capillary Refill: Capillary refill takes less than 2 seconds.  Neurological:     General: No focal deficit present.     Mental Status: He is alert. Mental status is at baseline.     Motor: No weakness.  Psychiatric:        Mood and Affect: Mood normal.        Behavior: Behavior normal.      UC Treatments / Results  Labs (all labs ordered are listed, but only abnormal results are displayed) Labs Reviewed - No data to display  EKG   Radiology No results found.  Procedures Procedures (including critical care time)  Medications Ordered in UC Medications - No data to display  Initial Impression / Assessment and Plan / UC Course  I have reviewed the triage vital signs and the nursing notes.  Pertinent labs & imaging results that were available during my care of the patient were reviewed by me and considered in my medical decision making (see chart for details).   89 year old male presenting for evaluation of abdominal pain that lasted for couple of hours resolved 30 minutes ago.  Pain started when he was eating a diabetic chicken and he has had 2 bowel movements since and now reports complete resolution of pain.  No associated fever.  Did not take anything for pain.  Now he is reporting feeling hungry and wanting to get more food.  Also requesting to have ordered lavage of both ears performed.  Exam reveals normal bowel sounds of abdomen with soft abdomen and no tenderness, guarding or rebound.  He does have bilateral cerumen impaction.  Otic lavage ordered to be performed  by nursing staff.  After lavage patient reports being able to hear better.  Ears are clean and normal-appearing now.  Explained that his abdominal pain was likely related to the fried food or could have been constipation but I am glad he is feeling better now.  If he has return of the abdominal pain he may return for reevaluation or go to emergency department especially if he has any associated nausea/vomiting, excessive diarrhea, blood in stool or dark stool.   Final Clinical Impressions(s) / UC Diagnoses   Final diagnoses:  Generalized abdominal pain  Bilateral impacted cerumen     Discharge Instructions      Explained that the abdominal pain was likely related to the fried food or could have been constipation but I am glad you are feeling better now.  If you have return of the abdominal pain you may return for reevaluation or go to emergency department especially if you have any associated nausea/vomiting, excessive diarrhea, blood in stool or dark stool.     ED Prescriptions   None    PDMP not reviewed this encounter.   Shirlee Latch, PA-C 02/06/23 1622

## 2023-02-06 NOTE — Discharge Instructions (Addendum)
Explained that the abdominal pain was likely related to the fried food or could have been constipation but I am glad you are feeling better now.  If you have return of the abdominal pain you may return for reevaluation or go to emergency department especially if you have any associated nausea/vomiting, excessive diarrhea, blood in stool or dark stool.

## 2023-02-15 ENCOUNTER — Emergency Department
Admission: EM | Admit: 2023-02-15 | Discharge: 2023-02-15 | Discharge status: Against Medical Advice | Payer: Medicare HMO | Attending: Emergency Medicine | Admitting: Emergency Medicine

## 2023-02-15 ENCOUNTER — Ambulatory Visit: Payer: Self-pay

## 2023-02-15 DIAGNOSIS — R55 Syncope and collapse: Secondary | ICD-10-CM | POA: Diagnosis not present

## 2023-02-15 DIAGNOSIS — Z5321 Procedure and treatment not carried out due to patient leaving prior to being seen by health care provider: Secondary | ICD-10-CM | POA: Insufficient documentation

## 2023-02-15 LAB — BASIC METABOLIC PANEL
Anion gap: 8 (ref 5–15)
BUN: 22 mg/dL (ref 8–23)
CO2: 24 mmol/L (ref 22–32)
Calcium: 8.4 mg/dL — ABNORMAL LOW (ref 8.9–10.3)
Chloride: 106 mmol/L (ref 98–111)
Creatinine, Ser: 1.06 mg/dL (ref 0.61–1.24)
GFR, Estimated: >60 mL/min (ref >60)
Glucose, Bld: 109 mg/dL — ABNORMAL HIGH (ref 70–99)
Potassium: 3.8 mmol/L (ref 3.5–5.1)
Sodium: 138 mmol/L (ref 135–145)

## 2023-02-15 LAB — CBC
HCT: 34.2 % — ABNORMAL LOW (ref 39.0–52.0)
Hemoglobin: 11.4 g/dL — ABNORMAL LOW (ref 13.0–17.0)
MCH: 30.2 pg (ref 26.0–34.0)
MCHC: 33.3 g/dL (ref 30.0–36.0)
MCV: 90.5 fL (ref 80.0–100.0)
Platelets: 220 10*3/uL (ref 150–400)
RBC: 3.78 MIL/uL — ABNORMAL LOW (ref 4.22–5.81)
RDW: 13.2 % (ref 11.5–15.5)
WBC: 3.6 10*3/uL — ABNORMAL LOW (ref 4.0–10.5)
nRBC: 0.6 % — ABNORMAL HIGH (ref 0.0–0.2)

## 2023-02-15 LAB — CBG MONITORING, ED: Glucose-Capillary: 103 mg/dL — ABNORMAL HIGH (ref 70–99)

## 2023-02-15 NOTE — ED Notes (Signed)
Called pt for VS at this time. Pt not visualized in the lobby at this time.

## 2023-02-15 NOTE — ED Triage Notes (Signed)
Pt reports feeling dizzy and having near syncopal episode when leaving the bathroom at approximately 0900. Denies CP, SOB, no fall per pt.

## 2023-02-15 NOTE — Telephone Encounter (Signed)
  Chief Complaint: dizziness- was unable to stand and chair prevented fall  Symptoms: pt stated the dizziness canme suddenly  Frequency: comes and goes - Sunday had episode and last night Pertinent Negatives: Patient denies weakness or numbness of face arms or legs, chest pain fever Disposition: [x] ED /[] Urgent Care (no appt availability in office) / [] Appointment(In office/virtual)/ []  [place] [facility]/ [] Home Care/ [] Refused Recommended Disposition /[] [place] [facility]/ []  Follow-up with PCP Additional Notes: called PCP office for dispo decision agreement- Dr Yetta Barre advised ED Reason for Disposition  SEVERE dizziness (e.g., unable to stand, requires support to walk, feels like passing out now)  Answer Assessment - Initial Assessment Questions 1. ONSET: "How long were you unconscious?" (minutes) "When did it happen?"     unsure 2. CONTENT: "What happened during period of unconsciousness?" (e.g., seizure activity)      alone 3. MENTAL STATUS: "Alert and oriented now?" (oriented x 3 = name, month, location)      yes 4. TRIGGER: "What do you think caused the fainting?" "What were you doing just before you fainted?"  (e.g., exercise, sudden standing up, prolonged standing)     Sudden  5. RECURRENT SYMPTOM: "Have you ever passed out before?" If Yes, ask: "When was the last time?" and "What happened that time?"      Yes unknown times or date 6. INJURY: "Did you sustain any injury during the fall?"      No chair caught him  7. CARDIAC SYMPTOMS: "Have you had any of the following symptoms: chest pain, difficulty breathing, palpitations?"     no 8. NEUROLOGIC SYMPTOMS: "Have you had any of the following symptoms: headache, numbness, vertigo, weakness?"     Intermittent "swimmy headed"  9. GI SYMPTOMS: "Have you had any of the following symptoms: abdomen pain, vomiting, diarrhea, blood in stools?"     no 10. OTHER SYMPTOMS: "Do you have any other symptoms?"       Sunday - dizziness  wanted to go to ED  Answer Assessment - Initial Assessment Questions 1. DESCRIPTION: "Describe your dizziness."     Just got dizzzy 2. LIGHTHEADED: "Do you feel lightheaded?" (e.g., somewhat faint, woozy, weak upon standing)     Somewhat faint 3. VERTIGO: "Do you feel like either you or the room is spinning or tilting?" (i.e. vertigo)     no 4. SEVERITY: "How bad is it?"  "Do you feel like you are going to faint?" "Can you stand and walk?"   - MILD: Feels slightly dizzy, but walking normally.   - MODERATE: Feels unsteady when walking, but not falling; interferes with normal activities (e.g., school, work).   - SEVERE: Unable to walk without falling, or requires assistance to walk without falling; feels like passing out now.      Back to baseline now 5. ONSET:  "When did the dizziness begin?"     Comes and goes  6. AGGRAVATING FACTORS: "Does anything make it worse?" (e.g., standing, change in head position)     standning 8. CAUSE: "What do you think is causing the dizziness?"     unknown 9. RECURRENT SYMPTOM: "Have you had dizziness before?" If Yes, ask: "When was the last time?" "What happened that time?"     Yes seen in ED for it before 10. OTHER SYMPTOMS: "Do you have any other symptoms?" (e.g., fever, chest pain, vomiting, diarrhea, bleeding)       no  Protocols used: Fainting-A-AH, Dizziness - Lightheadedness-A-AH

## 2023-02-16 ENCOUNTER — Telehealth: Payer: Self-pay

## 2023-02-16 NOTE — Transitions of Care (Post Inpatient/ED Visit) (Signed)
02/16/2023  Name: Eric [name] MRN: [id number] DOB: 1923-09-28  Today's TOC FU Call Status: Today's TOC FU Call Status:: Successful TOC FU Call Completed TOC FU Call Complete Date: 02/16/23 Patient's Name and Date of Birth confirmed.  Transition Care Management Follow-up Telephone Call Date of Discharge: 02/15/23 Discharge Facility: [facility] Type of Discharge: Emergency Department Reason for ED Visit: Other: How have you been since you were released from the hospital?: Better Any questions or concerns?: No  Items Reviewed: Did you receive and understand the discharge instructions provided?: Yes Medications obtained,verified, and reconciled?: Yes (Medications Reviewed) Any new allergies since your discharge?: No Dietary orders reviewed?: No Do you have support at home?: Yes People in Home: child(ren), adult, friend(s), other relative(s)  Medications Reviewed Today: Medications Reviewed Today   Medications were not reviewed in this encounter     Home Care and Equipment/Supplies: Were Home Health Services Ordered?: No Any new equipment or medical supplies ordered?: No  Functional Questionnaire: Do you need assistance with bathing/showering or dressing?: No Do you need assistance with meal preparation?: No Do you need assistance with eating?: No Do you have difficulty maintaining continence: No Do you need assistance with getting out of bed/getting out of a chair/moving?: No Do you have difficulty managing or taking your medications?: No  Follow up appointments reviewed: PCP Follow-up appointment confirmed?: Yes Date of PCP follow-up appointment?: 02/18/24 Specialist Hospital Follow-up appointment confirmed?: No Reason Specialist Follow-Up Not Confirmed: Appointment Sceduled by [facility] Calling Clinician Do you need transportation to your follow-up appointment?: No Do you understand care options if your condition(s) worsen?: Yes-patient  verbalized understanding    SIGNATURE

## 2023-02-18 ENCOUNTER — Ambulatory Visit: Payer: Medicare HMO | Admitting: Family Medicine

## 2023-03-22 ENCOUNTER — Ambulatory Visit (INDEPENDENT_AMBULATORY_CARE_PROVIDER_SITE_OTHER): Payer: Medicare HMO | Admitting: Family Medicine

## 2023-03-22 ENCOUNTER — Encounter: Payer: Self-pay | Admitting: Family Medicine

## 2023-03-22 ENCOUNTER — Other Ambulatory Visit (INDEPENDENT_AMBULATORY_CARE_PROVIDER_SITE_OTHER): Payer: Medicare HMO | Admitting: Radiology

## 2023-03-22 ENCOUNTER — Telehealth: Payer: Self-pay | Admitting: Family Medicine

## 2023-03-22 VITALS — BP 122/78 | HR 72 | Ht 72.0 in | Wt 140.0 lb

## 2023-03-22 DIAGNOSIS — M1711 Unilateral primary osteoarthritis, right knee: Secondary | ICD-10-CM
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
## 2023-03-22 NOTE — Patient Instructions (Signed)
You have just been given a cortisone injection to reduce pain and inflammation. After the injection you may notice immediate relief of pain as a result of the Lidocaine. It is important to rest the area of the injection for 24 to 48 hours after the injection. There is a possibility of some temporary increased discomfort and swelling for up to 72 hours until the cortisone begins to work. If you do have pain, simply rest the joint and use ice. If you can tolerate over the counter medications, you can try Tylenol, Aleve, or Advil for added relief per package instructions. - As above relative rest x 2 days then return to normal activity - Can use Voltaren gel once injection site has healed - Can repeat injection in 3 months if needed

## 2023-03-22 NOTE — Assessment & Plan Note (Signed)
Patient returns for acute on chronic right knee pain, continues Voltaren gel with limited response.  Pain localizes to lateral joint line.  Given his treatments to date, inability for [place] to obtain viscosupplementation, he did elect to proceed with intra-articular corticosteroid injection today.

## 2023-04-11 ENCOUNTER — Other Ambulatory Visit: Payer: Self-pay | Admitting: Family Medicine

## 2023-04-11 DIAGNOSIS — I10 Essential (primary) hypertension: Secondary | ICD-10-CM

## 2023-04-12 NOTE — Telephone Encounter (Signed)
Requested by interface surescripts. Future visit in 2 months.  Requested Prescriptions  Pending Prescriptions Disp Refills   amLODipine (NORVASC) 5 MG tablet [Pharmacy Med Name: amLODIPine Besylate Oral Tablet 5 MG] 180 tablet 1    Sig: TAKE 1 TABLET TWICE DAILY     Cardiovascular: Calcium Channel Blockers 2 Passed - 04/11/2023  6:06 PM      Passed - Last BP in normal range    BP Readings from Last 1 Encounters:  03/22/23 122/78         Passed - Last Heart Rate in normal range    Pulse Readings from Last 1 Encounters:  03/22/23 72         Passed - Valid encounter within last 6 months    Recent Outpatient Visits           3 weeks ago Primary osteoarthritis of right knee   [place] [facility], Ocie Bob, MD   3 months ago Chronic anemia   [place] [facility], [facility]   4 months ago Dehydration   [facility] at [facility], MD   6 months ago Essential hypertension   [place] [facility], MD   9 months ago Need for immunization against influenza   [facility] at [facility], MD       Future Appointments             In 2 months Duanne Limerick, MD [facility] Primary Care & Sports Medicine at [facility]

## 2023-04-18 ENCOUNTER — Telehealth: Payer: Self-pay | Admitting: Family Medicine

## 2023-04-18 ENCOUNTER — Other Ambulatory Visit: Payer: Self-pay

## 2023-04-18 DIAGNOSIS — E785 Hyperlipidemia, unspecified: Secondary | ICD-10-CM

## 2023-04-18 DIAGNOSIS — K219 Gastro-esophageal reflux disease without esophagitis: Secondary | ICD-10-CM

## 2023-04-18 DIAGNOSIS — N401 Enlarged prostate with lower urinary tract symptoms: Secondary | ICD-10-CM

## 2023-04-18 DIAGNOSIS — I10 Essential (primary) hypertension: Secondary | ICD-10-CM

## 2023-04-18 MED ORDER — TAMSULOSIN HCL 0.4 MG PO CAPS
0.4000 mg | ORAL_CAPSULE | Freq: Every day | ORAL | 0 refills | Status: DC
Start: 2023-04-18 — End: 2023-06-16

## 2023-04-18 MED ORDER — SIMVASTATIN 40 MG PO TABS
40.0000 mg | ORAL_TABLET | Freq: Every day | ORAL | 0 refills | Status: DC
Start: 2023-04-18 — End: 2023-04-29

## 2023-04-18 MED ORDER — OMEPRAZOLE 20 MG PO CPDR
20.0000 mg | DELAYED_RELEASE_CAPSULE | Freq: Every day | ORAL | 0 refills | Status: DC
Start: 2023-04-18 — End: 2023-06-16

## 2023-04-18 MED ORDER — LOSARTAN POTASSIUM 100 MG PO TABS
100.0000 mg | ORAL_TABLET | Freq: Every day | ORAL | 0 refills | Status: DC
Start: 2023-04-18 — End: 2023-06-16

## 2023-04-18 MED ORDER — AMLODIPINE BESYLATE 5 MG PO TABS
5.0000 mg | ORAL_TABLET | Freq: Two times a day (BID) | ORAL | 0 refills | Status: DC
Start: 2023-04-18 — End: 2023-06-16

## 2023-04-18 NOTE — Telephone Encounter (Signed)
Copied from CRM [id number]. Topic: General - Other >> Apr 18, 2023  8:05 AM Everette C wrote: Reason for CRM: The patient's niece would like to speak with a member of practice staff when possible to coordinate one week refills of all of the patient's medications excluding their blood pressure medication   Refill requests have been previously submitted for the patient however the patient's niece has been directed to contact the practice and request a one week supply of medications to hold the patient until their medications arrive in full quantity   The patient's niece would like to be contacted by a member of staff to review medications and further discuss refills   Please contact when possible

## 2023-04-18 NOTE — Telephone Encounter (Signed)
Pt's niece calling back to check on previous message. Niece requesting call back as soon possible to coordinate refills for medications for at least a one week supply.   Best call back number:  [phone]

## 2023-04-18 NOTE — Telephone Encounter (Signed)
Sent in 7 days of medications until patients mail order supply comes in .  - Normon Pettijohn

## 2023-04-27 DIAGNOSIS — R001 Bradycardia, unspecified: Secondary | ICD-10-CM | POA: Diagnosis not present

## 2023-04-27 DIAGNOSIS — Z950 Presence of cardiac pacemaker: Secondary | ICD-10-CM | POA: Diagnosis not present

## 2023-04-29 ENCOUNTER — Other Ambulatory Visit: Payer: Self-pay | Admitting: Family Medicine

## 2023-04-29 DIAGNOSIS — E785 Hyperlipidemia, unspecified: Secondary | ICD-10-CM

## 2023-04-29 NOTE — Telephone Encounter (Unsigned)
Copied from CRM [id number]. Topic: General - Other >> Apr 29, 2023  9:57 AM Everette C wrote: Reason for CRM: Medication Refill - Most Recent Primary Care Visit:  Provider: Jerrol Banana Department: [facility] Visit Type: OFFICE VISIT Date: 03/22/2023  Medication: simvastatin (ZOCOR) 40 MG tablet [[id number]]  Has the patient contacted their pharmacy? Yes (Agent: If no, request that the patient contact the pharmacy for the refill. If patient does not wish to contact the pharmacy document the reason why and proceed with request.) (Agent: If yes, when and what did the pharmacy advise?)  Is this the correct pharmacy for this prescription? Yes If no, delete pharmacy and type the correct one.  This is the patient's preferred pharmacy:  [id number] - [place], [place] - [address] AT [facility] OF [address] [place] [address] Phone: [phone] Fax: [phone]   Has the prescription been filled recently? Yes  Is the patient out of the medication? Yes  Has the patient been seen for an appointment in the last year OR does the patient have an upcoming appointment? Yes  Can we respond through MyChart? No  Agent: Please be advised that Rx refills may take up to 3 business days. We ask that you follow-up with your pharmacy.

## 2023-05-02 MED ORDER — SIMVASTATIN 40 MG PO TABS
40.0000 mg | ORAL_TABLET | Freq: Every day | ORAL | 2 refills | Status: DC
Start: 2023-05-02 — End: 2023-06-16

## 2023-05-02 NOTE — Telephone Encounter (Signed)
Requested Prescriptions  Pending Prescriptions Disp Refills   simvastatin (ZOCOR) 40 MG tablet 90 tablet 1    Sig: Take 1 tablet (40 mg total) by mouth at bedtime.     Cardiovascular:  Antilipid - Statins Failed - 04/29/2023 11:26 AM      Failed - Lipid Panel in normal range within the last 12 months    Cholesterol, Total  Date Value Ref Range Status  12/01/2021 206 (H) 100 - 199 mg/dL Final   Cholesterol  Date Value Ref Range Status  12/09/2022 140 0 - 200 mg/dL Final   LDL Chol Calc (NIH)  Date Value Ref Range Status  12/01/2021 109 (H) 0 - 99 mg/dL Final   LDL Cholesterol  Date Value Ref Range Status  12/09/2022 66 0 - 99 mg/dL Final    Comment:           Total Cholesterol/HDL:CHD Risk Coronary Heart Disease Risk Table                     Men   Women  1/2 Average Risk   3.4   3.3  Average Risk       5.0   4.4  2 X Average Risk   9.6   7.1  3 X Average Risk  23.4   11.0        Use the calculated Patient Ratio above and the CHD Risk Table to determine the patient's CHD Risk.        ATP III CLASSIFICATION (LDL):  <100     mg/dL   Optimal  161-096  mg/dL   Near or Above                    Optimal  130-159  mg/dL   Borderline  045-409  mg/dL   High  >811     mg/dL   Very High Performed at [facility], [address] [place], [place] [address]    HDL  Date Value Ref Range Status  12/09/2022 64 >40 mg/dL Final  29/56/2130 84 >86 mg/dL Final   Triglycerides  Date Value Ref Range Status  12/09/2022 48 <150 mg/dL Final         Passed - Patient is not pregnant      Passed - Valid encounter within last 12 months    Recent Outpatient Visits           1 month ago Primary osteoarthritis of right knee   [facility], Ocie Bob, MD   4 months ago Chronic anemia   [place] [facility], MD   5 months ago Dehydration   [facility], [facility]   7 months ago Essential hypertension   [place] [facility], MD   10 months ago Need for immunization against influenza   [facility], [facility]       Future Appointments             In 1 month Duanne Limerick, MD [facility] at [facility], [facility]

## 2023-05-11 DIAGNOSIS — I472 Ventricular tachycardia, unspecified: Secondary | ICD-10-CM | POA: Diagnosis not present

## 2023-05-11 DIAGNOSIS — E782 Mixed hyperlipidemia: Secondary | ICD-10-CM | POA: Diagnosis not present

## 2023-05-11 DIAGNOSIS — R001 Bradycardia, unspecified: Secondary | ICD-10-CM | POA: Diagnosis not present

## 2023-05-11 DIAGNOSIS — Z950 Presence of cardiac pacemaker: Secondary | ICD-10-CM | POA: Diagnosis not present

## 2023-05-11 DIAGNOSIS — I10 Essential (primary) hypertension: Secondary | ICD-10-CM | POA: Diagnosis not present

## 2023-05-12 DIAGNOSIS — H40153 Residual stage of open-angle glaucoma, bilateral: Secondary | ICD-10-CM | POA: Diagnosis not present

## 2023-05-30 ENCOUNTER — Ambulatory Visit: Payer: Self-pay

## 2023-05-30 NOTE — Telephone Encounter (Signed)
 Summary: med ?   Pt's niece called in wants to go over what meds the pt needs to be taking     Left message to call back.

## 2023-05-30 NOTE — Telephone Encounter (Signed)
 Permission to speak to Ms. Smitty given by patient.   Chief Complaint: medication verification  Symptoms: request review of medication list  Disposition: [] ED /[] Urgent Care (no appt availability in office) / [] Appointment(In office/virtual)/ []  [facility]/ [x] Home Care/ [] Refused Recommended Disposition /[] [place] [facility]/ []  Follow-up with PCP Additional Notes: List reviewed and patient has enough medication to last until appointment- reviewed how to tell if refills are needed and when to contact office.   Reason for Disposition  Caller has medicine question only, adult not sick, AND triager answers question  Answer Assessment - Initial Assessment Questions 1. NAME of MEDICINE: What medicine(s) are you calling about?     Patient's niece is calling to review the medication 2. QUESTION: What is your question? (e.g., double dose of medicine, side effect)     Wants to review medication list to make sure patient has everything he needs 3. PRESCRIBER: Who prescribed the medicine? Reason: if prescribed by specialist, call should be referred to that group.     PCP Medications reviewed and patient has sufficient medication to last until next appointment this month  Protocols used: Medication Question Call-A-AH

## 2023-06-16 ENCOUNTER — Ambulatory Visit (INDEPENDENT_AMBULATORY_CARE_PROVIDER_SITE_OTHER): Payer: Medicare HMO | Admitting: Family Medicine

## 2023-06-16 ENCOUNTER — Encounter: Payer: Self-pay | Admitting: Family Medicine

## 2023-06-16 VITALS — BP 128/58 | HR 79 | Ht 72.0 in

## 2023-06-16 DIAGNOSIS — I10 Essential (primary) hypertension: Secondary | ICD-10-CM

## 2023-06-16 DIAGNOSIS — Z23 Encounter for immunization: Secondary | ICD-10-CM | POA: Diagnosis not present

## 2023-06-16 DIAGNOSIS — N401 Enlarged prostate with lower urinary tract symptoms: Secondary | ICD-10-CM

## 2023-06-16 DIAGNOSIS — E785 Hyperlipidemia, unspecified: Secondary | ICD-10-CM

## 2023-06-16 DIAGNOSIS — Z862 Personal history of diseases of the blood and blood-forming organs and certain disorders involving the immune mechanism: Secondary | ICD-10-CM | POA: Diagnosis not present

## 2023-06-16 DIAGNOSIS — J301 Allergic rhinitis due to pollen: Secondary | ICD-10-CM

## 2023-06-16 DIAGNOSIS — K219 Gastro-esophageal reflux disease without esophagitis: Secondary | ICD-10-CM

## 2023-06-16 DIAGNOSIS — R7303 Prediabetes: Secondary | ICD-10-CM

## 2023-06-16 MED ORDER — AMLODIPINE BESYLATE 5 MG PO TABS
5.0000 mg | ORAL_TABLET | Freq: Two times a day (BID) | ORAL | 1 refills | Status: DC
Start: 2023-06-16 — End: 2023-09-05

## 2023-06-16 MED ORDER — OMEPRAZOLE 20 MG PO CPDR
20.0000 mg | DELAYED_RELEASE_CAPSULE | Freq: Every day | ORAL | 1 refills | Status: DC
Start: 2023-06-16 — End: 2023-10-13

## 2023-06-16 MED ORDER — FLUTICASONE PROPIONATE 50 MCG/ACT NA SUSP
NASAL | 1 refills | Status: DC
Start: 2023-06-16 — End: 2023-10-13

## 2023-06-16 MED ORDER — TAMSULOSIN HCL 0.4 MG PO CAPS: 0.4000 mg | ORAL_CAPSULE | Freq: Every day | ORAL | 1 refills | Status: DC

## 2023-06-16 MED ORDER — LOSARTAN POTASSIUM 100 MG PO TABS: 100.0000 mg | ORAL_TABLET | Freq: Every day | ORAL | 1 refills | Status: DC

## 2023-06-16 MED ORDER — SIMVASTATIN 40 MG PO TABS: 40.0000 mg | ORAL_TABLET | Freq: Every day | ORAL | 1 refills | Status: DC

## 2023-06-16 NOTE — Progress Notes (Signed)
Date:  06/16/2023   Name:  Eric [name]   DOB:  25-Mar-1924   MRN:  [id number]   Chief Complaint: Anemia, Hypertension, Allergies, Gastroesophageal Reflux, Hyperlipidemia, and Benign Prostatic Hypertrophy  Anemia Presents for follow-up visit. There has been no abdominal pain, fever, light-headedness, malaise/fatigue or palpitations. Signs of blood loss that are not present include hematemesis, hematochezia and melena. There is no history of chronic renal disease. There are no compliance problems.   Hypertension This is a chronic problem. The problem has been gradually improving since onset. The problem is controlled. Pertinent negatives include no anxiety, blurred vision, chest pain, headaches, malaise/fatigue, neck pain, orthopnea, palpitations, peripheral edema, PND, shortness of breath or sweats. There are no associated agents to hypertension. There are no known risk factors for coronary artery disease. Past treatments include calcium channel blockers and angiotensin blockers. There are no compliance problems.  There is no history of CAD/MI or CVA. There is no history of chronic renal disease, a hypertension causing med or renovascular disease.  Gastroesophageal Reflux He reports no abdominal pain, no chest pain, no coughing, no dysphagia, no heartburn, no nausea or no wheezing. This is a chronic problem. The current episode started more than 1 year ago. The problem has been gradually improving. Pertinent negatives include no melena. He has tried a PPI for the symptoms. The treatment provided moderate relief.  Hyperlipidemia This is a chronic problem. The current episode started more than 1 year ago. He has no history of chronic renal disease. Pertinent negatives include no chest pain, focal sensory loss, focal weakness, leg pain, myalgias or shortness of breath. Current antihyperlipidemic treatment includes statins. The current treatment provides moderate improvement of lipids. There are no  compliance problems.   Benign Prostatic Hypertrophy This is a chronic problem. The problem has been gradually improving since onset. Irritative symptoms do not include frequency, nocturia or urgency. Obstructive symptoms do not include incomplete emptying, an intermittent stream, a slower stream or a weak stream. Pertinent negatives include no nausea. Past treatments include tamsulosin. The treatment provided moderate relief.    Lab Results  Component Value Date   NA 138 02/15/2023   K 3.8 02/15/2023   CO2 24 02/15/2023   GLUCOSE 109 (H) 02/15/2023   BUN 22 02/15/2023   CREATININE 1.06 02/15/2023   CALCIUM 8.4 (L) 02/15/2023   EGFR 60 12/01/2021   GFRNONAA >60 02/15/2023   Lab Results  Component Value Date   CHOL 140 12/09/2022   HDL 64 12/09/2022   LDLCALC 66 12/09/2022   TRIG 48 12/09/2022   CHOLHDL 2.2 12/09/2022   No results found for: "TSH" Lab Results  Component Value Date   HGBA1C 6.3 (H) 12/09/2022   Lab Results  Component Value Date   WBC 3.6 (L) 02/15/2023   HGB 11.4 (L) 02/15/2023   HCT 34.2 (L) 02/15/2023   MCV 90.5 02/15/2023   PLT 220 02/15/2023   Lab Results  Component Value Date   ALT 13 12/09/2022   AST 19 12/09/2022   ALKPHOS 39 12/09/2022   BILITOT 0.4 12/09/2022   No results found for: "25OHVITD2", "25OHVITD3", "VD25OH"   Review of Systems  Constitutional:  Negative for fever and malaise/fatigue.  Eyes:  Negative for blurred vision.  Respiratory:  Negative for cough, shortness of breath and wheezing.   Cardiovascular:  Negative for chest pain, palpitations, orthopnea and PND.  Gastrointestinal:  Negative for abdominal pain, blood in stool, dysphagia, heartburn, hematemesis, hematochezia, melena and nausea.  Genitourinary:  Negative for difficulty urinating, frequency, incomplete emptying, nocturia and urgency.  Musculoskeletal:  Negative for myalgias and neck pain.  Neurological:  Negative for focal weakness, light-headedness and headaches.     Patient Active Problem List   Diagnosis Date Noted   Vertigo 12/09/2022   Chronic anemia 12/09/2022   Primary osteoarthritis of right knee 12/11/2021   Other chest pain 10/02/2018   Presence of cardiac pacemaker 01/04/2018   Symptomatic bradycardia 12/08/2017   Abnormal EKG 11/02/2017   Bradycardia by electrocardiogram 11/02/2017   Shortness of breath 11/02/2017   Essential hypertension 10/22/2015   Hyperlipidemia, mixed 10/22/2015   Benign prostatic hyperplasia 10/22/2015   Arthritis, senescent 12/05/2013   Benign essential HTN 02/14/2013   Increased frequency of urination 05/22/2012   Nocturia 05/22/2012   Personal history of prostate cancer 05/22/2012    No Known Allergies  Past Surgical History:  Procedure Laterality Date   EYE SURGERY     cataract extraction   HERNIA REPAIR Left 2014   inguinal hernia   PACEMAKER INSERTION Left 12/27/2017   Procedure: INSERTION PACEMAKER-INITIAL DUAL CHAMBER;  Surgeon: Marcina Millard, MD;  Location: [facility] ORS;  Service: Cardiovascular;  Laterality: Left;    Social History   Tobacco Use   Smoking status: Never   Smokeless tobacco: Never  Vaping Use   Vaping status: Never Used  Substance Use Topics   Alcohol use: No   Drug use: No     Medication list has been reviewed and updated.  Current Meds  Medication Sig   acetaminophen (TYLENOL) 325 MG tablet Take 650 mg by mouth every 6 (six) hours as needed.   amLODipine (NORVASC) 5 MG tablet Take 1 tablet (5 mg total) by mouth 2 (two) times daily.   brimonidine (ALPHAGAN) 0.2 % ophthalmic solution Place 1 drop into both eyes 3 (three) times daily.   diclofenac Sodium (VOLTAREN) 1 % GEL Apply 2 g topically 4 (four) times daily as needed. To affected joint.   fluticasone (FLONASE) 50 MCG/ACT nasal spray USE 1 SPRAY IN EACH NOSTRIL EVERY DAY (NEED MD APPOINTMENT)   latanoprost (XALATAN) 0.005 % ophthalmic solution Place 1 drop into both eyes at bedtime.   losartan (COZAAR)  100 MG tablet Take 1 tablet (100 mg total) by mouth daily.   omeprazole (PRILOSEC) 20 MG capsule Take 1 capsule (20 mg total) by mouth daily.   simvastatin (ZOCOR) 40 MG tablet Take 1 tablet (40 mg total) by mouth at bedtime.   tamsulosin (FLOMAX) 0.4 MG CAPS capsule Take 1 capsule (0.4 mg total) by mouth daily.   timolol (TIMOPTIC) 0.25 % ophthalmic solution Place 1 drop into both eyes 2 (two) times daily.       06/16/2023    1:36 PM 03/22/2023    1:24 PM 12/23/2022   10:19 AM 06/23/2022   10:09 AM  GAD 7 : Generalized Anxiety Score  Nervous, Anxious, on Edge 0 0 0 0  Control/stop worrying 0 0 0 0  Worry too much - different things 0 0 0 0  Trouble relaxing 0 0 0 0  Restless 0 0 0 0  Easily annoyed or irritable 0 0 0 0  Afraid - awful might happen 0 0 0 0  Total GAD 7 Score 0 0 0 0  Anxiety Difficulty Not difficult at all Not difficult at all Not difficult at all Not difficult at all       06/16/2023    1:36 PM 03/22/2023    1:24 PM 12/23/2022  10:19 AM  Depression screen PHQ 2/9  Decreased Interest 0 0 0  Down, Depressed, Hopeless 0 0 0  PHQ - 2 Score 0 0 0  Altered sleeping  0 0  Tired, decreased energy  0 0  Change in appetite  0 0  Feeling bad or failure about yourself   0 0  Trouble concentrating  0 0  Moving slowly or fidgety/restless  0 0  Suicidal thoughts  0 0  PHQ-9 Score  0 0  Difficult doing work/chores  Not difficult at all Not difficult at all    BP Readings from Last 3 Encounters:  06/16/23 (!) 128/58  03/22/23 122/78  02/15/23 (!) 168/71    Physical Exam Vitals and nursing note reviewed.  Constitutional:      Appearance: He is well-developed.  HENT:     Head: Normocephalic and atraumatic.     Right Ear: Tympanic membrane, ear canal and external ear normal.     Left Ear: Tympanic membrane, ear canal and external ear normal.     Nose: Nose normal.     Mouth/Throat:     Dentition: Normal dentition.  Eyes:     General: Lids are normal. No scleral  icterus.    Conjunctiva/sclera: Conjunctivae normal.     Pupils: Pupils are equal, round, and reactive to light.  Neck:     Thyroid: No thyromegaly.     Vascular: No carotid bruit, hepatojugular reflux or JVD.     Trachea: No tracheal deviation.  Cardiovascular:     Rate and Rhythm: Normal rate and regular rhythm.     Pulses: Normal pulses.     Heart sounds: Normal heart sounds. No murmur heard.    No gallop.  Pulmonary:     Effort: Pulmonary effort is normal.     Breath sounds: Normal breath sounds. No wheezing, rhonchi or rales.  Abdominal:     General: Bowel sounds are normal.     Palpations: Abdomen is soft. There is no hepatomegaly, splenomegaly or mass.     Tenderness: There is no abdominal tenderness.     Hernia: There is no hernia in the left inguinal area.  Musculoskeletal:        General: Normal range of motion.     Cervical back: Normal range of motion and neck supple.  Lymphadenopathy:     Cervical: No cervical adenopathy.  Skin:    General: Skin is warm and dry.     Findings: No rash.  Neurological:     Mental Status: He is alert.     Deep Tendon Reflexes: Reflexes are normal and symmetric.  Psychiatric:        Mood and Affect: Mood is not anxious or depressed.     Wt Readings from Last 3 Encounters:  03/22/23 140 lb (63.5 kg)  02/06/23 141 lb 15.6 oz (64.4 kg)  12/23/22 142 lb (64.4 kg)    BP (!) 128/58   Pulse 79   Ht 6' (1.829 m)   SpO2 95%   BMI 18.99 kg/m   Assessment and Plan: 1. Essential hypertension (Primary) Chronic.  Controlled.  Stable.  Asymptomatic.  Blood pressure today is 128/58.  Tolerating medications well.  Continue amlodipine 5 mg once a day and losartan 100 mg once a day.  Recently we decreased amlodipine from 10 to 5 mg because of leg swelling the leg swelling has resolved with the reduction of dose and he is now able to wear his Sunday shoes.  Furthermore we  will check a CMP for electrolytes and GFR and will recheck patient in 6  months. - amLODipine (NORVASC) 5 MG tablet; Take 1 tablet (5 mg total) by mouth 2 (two) times daily.  Dispense: 90 tablet; Refill: 1 - Comprehensive metabolic panel - losartan (COZAAR) 100 MG tablet; Take 1 tablet (100 mg total) by mouth daily.  Dispense: 90 tablet; Refill: 1  2. Seasonal allergic rhinitis due to pollen Chronic.  Controlled.  Stable.  Continue fluticasone nasal spray 1 spray each nostril once a day. - fluticasone (FLONASE) 50 MCG/ACT nasal spray; USE 1 SPRAY IN EACH NOSTRIL EVERY DAY (NEED MD APPOINTMENT)  Dispense: 48 g; Refill: 1  3. Gastroesophageal reflux disease Chronic.  Controlled.  Stable.  Asymptomatic with no episodes of breakthrough heartburn.  We will continue on omeprazole 20 mg once a day. - omeprazole (PRILOSEC) 20 MG capsule; Take 1 capsule (20 mg total) by mouth daily.  Dispense: 90 capsule; Refill: 1  4. Hyperlipidemia, unspecified hyperlipidemia type Chronic.  Controlled.  Stable.  Asymptomatic.  Tolerating current dosing of simvastatin 40 mg once a day.  Will check lipid panel for current level of LDL and total cholesterol.  We will also check CMP for ALT hepatic concerns of medication. - simvastatin (ZOCOR) 40 MG tablet; Take 1 tablet (40 mg total) by mouth at bedtime.  Dispense: 90 tablet; Refill: 1 - Lipid Panel With LDL/HDL Ratio  5. Benign prostatic hyperplasia with lower urinary tract symptoms, symptom details unspecified Chronic controlled stable patient has tolerating tamsulosin 0.4 mg once a day there is no issues with hesitancy decreased stream or urinary incontinence.  Will continue current dosing of Flomax at 0.4 mg once a day and will check PSA to monitor for prostatic concerns. - tamsulosin (FLOMAX) 0.4 MG CAPS capsule; Take 1 capsule (0.4 mg total) by mouth daily.  Dispense: 90 capsule; Refill: 1 - PSA  6. Immunization due Discussed and administered. - Flu Vaccine Trivalent High Dose (Fluad)  7. Prediabetes Previously elevated in the 6  range of A1c.  Patient is currently controlling with diet and I rather doubt we will start medication unless is significantly elevated in the future. - Hemoglobin A1c  8. History of anemia Patient with history of anemia.  Patient is not having any manifestation of fatigue in the last 4 months.  We will check CBC for current hemoglobin to see if this remains stabilized with no episodes of sudden drop of hemoglobin since patient does have episodes of being "swimmy headed". - CBC    Elizabeth Sauer, MD

## 2023-06-17 LAB — LIPID PANEL WITH LDL/HDL RATIO
Cholesterol, Total: 178 mg/dL (ref 100–199)
HDL: 82 mg/dL (ref >39)
LDL Chol Calc (NIH): 76 mg/dL (ref 0–99)
LDL/HDL Ratio: 0.9 {ratio} (ref 0.0–3.6)
Triglycerides: 113 mg/dL (ref 0–149)
VLDL Cholesterol Cal: 20 mg/dL (ref 5–40)

## 2023-06-17 LAB — CBC
Hematocrit: 32.4 % — ABNORMAL LOW (ref 37.5–51.0)
Hemoglobin: 10.6 g/dL — ABNORMAL LOW (ref 13.0–17.7)
MCH: 29.9 pg (ref 26.6–33.0)
MCHC: 32.7 g/dL (ref 31.5–35.7)
MCV: 92 fL (ref 79–97)
Platelets: 236 10*3/uL (ref 150–450)
RBC: 3.54 x10E6/uL — ABNORMAL LOW (ref 4.14–5.80)
RDW: 12.6 % (ref 11.6–15.4)
WBC: 5.4 10*3/uL (ref 3.4–10.8)

## 2023-06-17 LAB — COMPREHENSIVE METABOLIC PANEL
ALT: 14 [IU]/L (ref 0–44)
AST: 19 [IU]/L (ref 0–40)
Albumin: 3.9 g/dL (ref 3.6–4.6)
Alkaline Phosphatase: 51 [IU]/L (ref 44–121)
BUN/Creatinine Ratio: 21 (ref 10–24)
BUN: 27 mg/dL (ref 10–36)
Bilirubin Total: 0.2 mg/dL (ref 0.0–1.2)
CO2: 23 mmol/L (ref 20–29)
Calcium: 8.6 mg/dL (ref 8.6–10.2)
Chloride: 104 mmol/L (ref 96–106)
Creatinine, Ser: 1.28 mg/dL — ABNORMAL HIGH (ref 0.76–1.27)
Globulin, Total: 2.9 g/dL (ref 1.5–4.5)
Glucose: 159 mg/dL — ABNORMAL HIGH (ref 70–99)
Potassium: 4.6 mmol/L (ref 3.5–5.2)
Sodium: 141 mmol/L (ref 134–144)
Total Protein: 6.8 g/dL (ref 6.0–8.5)
eGFR: 51 mL/min/{1.73_m2} — ABNORMAL LOW (ref >59)

## 2023-06-17 LAB — HEMOGLOBIN A1C
Est. average glucose Bld gHb Est-mCnc: 140 mg/dL
Hgb A1c MFr Bld: 6.5 % — ABNORMAL HIGH (ref 4.8–5.6)

## 2023-06-17 LAB — PSA: Prostate Specific Ag, Serum: 3.9 ng/mL (ref 0.0–4.0)

## 2023-09-05 ENCOUNTER — Other Ambulatory Visit: Payer: Self-pay | Admitting: Family Medicine

## 2023-09-05 DIAGNOSIS — I10 Essential (primary) hypertension: Secondary | ICD-10-CM

## 2023-09-19 ENCOUNTER — Other Ambulatory Visit (INDEPENDENT_AMBULATORY_CARE_PROVIDER_SITE_OTHER): Payer: Self-pay | Admitting: Radiology

## 2023-09-19 ENCOUNTER — Ambulatory Visit: Admitting: Family Medicine

## 2023-09-19 ENCOUNTER — Ambulatory Visit (INDEPENDENT_AMBULATORY_CARE_PROVIDER_SITE_OTHER): Admitting: Family Medicine

## 2023-09-19 ENCOUNTER — Encounter: Payer: Self-pay | Admitting: Family Medicine

## 2023-09-19 VITALS — BP 130/62 | HR 66 | Ht 72.0 in

## 2023-09-19 DIAGNOSIS — M1711 Unilateral primary osteoarthritis, right knee: Secondary | ICD-10-CM | POA: Diagnosis not present

## 2023-09-19 MED ORDER — TRIAMCINOLONE ACETONIDE 40 MG/ML IJ SUSP
40.0000 mg | Freq: Once | INTRAMUSCULAR | Status: AC
  Administered 2023-09-19: 40 mg via INTRAMUSCULAR

## 2023-09-19 NOTE — Assessment & Plan Note (Signed)
 History of Present Illness Eric [name] is a 89 year old male who presents with knee pain and swelling.  He experiences recurrent knee pain and swelling, primarily located at the front of the knee and extending down to the foot. The area is swollen, and he has difficulty bending the knee, putting on shoes, and moving it without assistance.  He recalls that a cortisone shot was effective in alleviating the pain during a previous episode. In October 2024, he received medication for the knee but does not mention any fluid being drawn at that time.  He is currently experiencing significant discomfort and limited mobility due to the knee issue. No soreness in other areas of the knee except for the front and down to the foot.  Physical Exam INSPECTION: Right knee swelling. PALPATION: 2+ effusion, primary tenderness about the superior aspect of patella, distal quadriceps, patellofemoral complex, medial and lateral joint lines minimally tender.  No ligamentous laxity..  Assessment and Plan Right Knee pain with effusion Chronic anterior knee pain with significant effusion impairing mobility. - Perform knee aspiration to remove excess fluid. - Administer cortisone injection post-aspiration to reduce inflammation and pain. - Consider repeat procedure if symptoms recur after three months.

## 2023-09-19 NOTE — Progress Notes (Signed)
     Primary Care / Sports Medicine Office Visit  Patient Information:  Patient ID: Eric [name], male DOB: 13-Feb-1924 Age: 89 y.o. MRN: [id number]   Eric [name] is a pleasant 89 y.o. male presenting with the following:  Chief Complaint  Patient presents with   Knee Pain    Right knee pain for a few months now. He is having difficulty walking, putting his clothes on,bending. His knee is also swelling He would like a cortisone injection today. His pain level is a 7/10.     Vitals:   09/19/23 1501  BP: 130/62  Pulse: 66  SpO2: 99%   Vitals:   09/19/23 1501  Height: 6' (1.829 m)   Body mass index is 18.99 kg/m.  No results found.   Independent interpretation of notes and tests performed by another provider:   None  Procedures performed:   Procedure:  Injection of right knee under ultrasound guidance. Ultrasound guidance utilized for anteromedial approach Samsung HS60 device utilized with permanent recording / reporting. Verbal informed consent obtained and verified. Skin prepped in a sterile fashion. Ethyl chloride for topical local analgesia.  Completed without difficulty and tolerated well. Medication: triamcinolone  acetonide 40 mg/mL suspension for injection 1 mL total and 2 mL lidocaine  1% without epinephrine utilized for needle placement anesthetic Advised to contact for fevers/chills, erythema, induration, drainage, or persistent bleeding.   Pertinent History, Exam, Impression, and Recommendations:   Problem List Items Addressed This Visit     Primary osteoarthritis of right knee - Primary   History of Present Illness Eric [name] is a 89 year old male who presents with knee pain and swelling.  He experiences recurrent knee pain and swelling, primarily located at the front of the knee and extending down to the foot. The area is swollen, and he has difficulty bending the knee, putting on shoes, and moving it without assistance.  He recalls that a  cortisone shot was effective in alleviating the pain during a previous episode. In October 2024, he received medication for the knee but does not mention any fluid being drawn at that time.  He is currently experiencing significant discomfort and limited mobility due to the knee issue. No soreness in other areas of the knee except for the front and down to the foot.  Physical Exam INSPECTION: Right knee swelling. PALPATION: 2+ effusion, primary tenderness about the superior aspect of patella, distal quadriceps, patellofemoral complex, medial and lateral joint lines minimally tender.  No ligamentous laxity..  Assessment and Plan Right Knee pain with effusion Chronic anterior knee pain with significant effusion impairing mobility. - Perform knee aspiration to remove excess fluid. - Administer cortisone injection post-aspiration to reduce inflammation and pain. - Consider repeat procedure if symptoms recur after three months.      Relevant Orders   US  LIMITED JOINT SPACE STRUCTURES LOW RIGHT     Orders & Medications Medications:  Meds ordered this encounter  Medications   triamcinolone  acetonide (KENALOG -40) injection 40 mg   Orders Placed This Encounter  Procedures   US  LIMITED JOINT SPACE STRUCTURES LOW RIGHT     No follow-ups on file.     Ma Saupe, MD, [facility]   Primary Care Sports Medicine [facility] at [facility]

## 2023-09-19 NOTE — Patient Instructions (Signed)
 You have just been given a cortisone injection to reduce pain and inflammation. After the injection you may notice immediate relief of pain as a result of the Lidocaine . It is important to rest the area of the injection for 24 to 48 hours after the injection. There is a possibility of some temporary increased discomfort and swelling for up to 72 hours until the cortisone begins to work. If you do have pain, simply rest the joint and use ice. If you can tolerate over the counter medications, you can try Tylenol , Aleve, or Advil for added relief per package instructions.   Patient Plan  Right Knee Pain with Effusion:  1. Undergo knee aspiration to remove excess fluid and improve mobility. 2. Receive a cortisone injection after the aspiration to reduce inflammation and pain. 3. Consider repeating the procedure if symptoms return after three months.  Red Flags: - Contact your healthcare provider if you experience increased swelling, redness, or severe pain in the knee after the procedure.

## 2023-10-13 ENCOUNTER — Encounter: Payer: Self-pay | Admitting: Family Medicine

## 2023-10-13 ENCOUNTER — Ambulatory Visit (INDEPENDENT_AMBULATORY_CARE_PROVIDER_SITE_OTHER): Payer: Self-pay | Admitting: Family Medicine

## 2023-10-13 VITALS — BP 124/60 | HR 73 | Ht 72.0 in | Wt 141.1 lb

## 2023-10-13 DIAGNOSIS — N401 Enlarged prostate with lower urinary tract symptoms: Secondary | ICD-10-CM | POA: Diagnosis not present

## 2023-10-13 DIAGNOSIS — E785 Hyperlipidemia, unspecified: Secondary | ICD-10-CM

## 2023-10-13 DIAGNOSIS — I10 Essential (primary) hypertension: Secondary | ICD-10-CM

## 2023-10-13 DIAGNOSIS — K219 Gastro-esophageal reflux disease without esophagitis: Secondary | ICD-10-CM | POA: Diagnosis not present

## 2023-10-13 MED ORDER — SIMVASTATIN 40 MG PO TABS
40.0000 mg | ORAL_TABLET | Freq: Every day | ORAL | 1 refills | Status: AC
Start: 2023-10-13 — End: ?

## 2023-10-13 MED ORDER — AMLODIPINE BESYLATE 5 MG PO TABS: 5.0000 mg | ORAL_TABLET | Freq: Two times a day (BID) | ORAL | 1 refills | Status: DC

## 2023-10-13 MED ORDER — TAMSULOSIN HCL 0.4 MG PO CAPS: 0.4000 mg | ORAL_CAPSULE | Freq: Every day | ORAL | 1 refills | Status: AC

## 2023-10-13 MED ORDER — LOSARTAN POTASSIUM 100 MG PO TABS: 100.0000 mg | ORAL_TABLET | Freq: Every day | ORAL | 1 refills | Status: AC

## 2023-10-13 MED ORDER — OMEPRAZOLE 20 MG PO CPDR
20.0000 mg | DELAYED_RELEASE_CAPSULE | Freq: Every day | ORAL | 1 refills | Status: AC
Start: 2023-10-13 — End: ?

## 2023-10-13 MED ORDER — AMLODIPINE BESYLATE 5 MG PO TABS: 5.0000 mg | ORAL_TABLET | Freq: Every day | ORAL | 1 refills | Status: DC

## 2023-10-13 NOTE — Progress Notes (Signed)
 Date:  10/13/2023   Name:  Eric [name]   DOB:  1923-05-31   MRN:  [id number]   Chief Complaint: Hypertension and Medication Refill  Hypertension This is a chronic problem. The current episode started more than 1 year ago. The problem has been gradually improving since onset. The problem is controlled. Pertinent negatives include no anxiety, blurred vision, chest pain, headaches, malaise/fatigue, neck pain, orthopnea, palpitations, peripheral edema, PND, shortness of breath or sweats. There are no associated agents to hypertension. Risk factors for coronary artery disease include dyslipidemia. Past treatments include angiotensin blockers and calcium  channel blockers. The current treatment provides moderate improvement. There are no compliance problems.  There is no history of CAD/MI or CVA. There is no history of chronic renal disease, a hypertension causing med or renovascular disease.  Medication Refill Associated symptoms include arthralgias. Pertinent negatives include no abdominal pain, chest pain, coughing, headaches or neck pain.  Hyperlipidemia He has no history of chronic renal disease. Pertinent negatives include no chest pain or shortness of breath. Current antihyperlipidemic treatment includes statins. The current treatment provides moderate improvement of lipids. There are no compliance problems.  Risk factors for coronary artery disease include dyslipidemia.  Benign Prostatic Hypertrophy This is a chronic problem. The current episode started more than 1 year ago. The problem has been gradually improving since onset. Irritative symptoms do not include frequency, nocturia or urgency. Obstructive symptoms do not include dribbling, incomplete emptying, an intermittent stream, a slower stream, straining or a weak stream. Pertinent negatives include no hesitancy. Past treatments include tamsulosin .  Gastroesophageal Reflux He reports no abdominal pain, no chest pain, no coughing or no  wheezing. This is a chronic problem. The current episode started more than 1 year ago. The problem has been gradually improving. The symptoms are aggravated by certain foods.    Lab Results  Component Value Date   NA 141 06/16/2023   K 4.6 06/16/2023   CO2 23 06/16/2023   GLUCOSE 159 (H) 06/16/2023   BUN 27 06/16/2023   CREATININE 1.28 (H) 06/16/2023   CALCIUM  8.6 06/16/2023   EGFR 51 (L) 06/16/2023   GFRNONAA >60 02/15/2023   Lab Results  Component Value Date   CHOL 178 06/16/2023   HDL 82 06/16/2023   LDLCALC 76 06/16/2023   TRIG 113 06/16/2023   CHOLHDL 2.2 12/09/2022   No results found for: "TSH" Lab Results  Component Value Date   HGBA1C 6.5 (H) 06/16/2023   Lab Results  Component Value Date   WBC 5.4 06/16/2023   HGB 10.6 (L) 06/16/2023   HCT 32.4 (L) 06/16/2023   MCV 92 06/16/2023   PLT 236 06/16/2023   Lab Results  Component Value Date   ALT 14 06/16/2023   AST 19 06/16/2023   ALKPHOS 51 06/16/2023   BILITOT 0.2 06/16/2023   No results found for: "25OHVITD2", "25OHVITD3", "VD25OH"   Review of Systems  Constitutional:  Negative for malaise/fatigue.  Eyes:  Negative for blurred vision and visual disturbance.  Respiratory:  Negative for cough, shortness of breath and wheezing.   Cardiovascular:  Negative for chest pain, palpitations, orthopnea, leg swelling and PND.  Gastrointestinal:  Negative for abdominal pain.  Endocrine: Negative for polydipsia and polyuria.  Genitourinary:  Negative for difficulty urinating, frequency, hesitancy, incomplete emptying, nocturia, penile swelling and urgency.  Musculoskeletal:  Positive for arthralgias. Negative for neck pain.  Neurological:  Negative for headaches.    Patient Active Problem List   Diagnosis Date Noted  Vertigo 12/09/2022   Chronic anemia 12/09/2022   Primary osteoarthritis of right knee 12/11/2021   Other chest pain 10/02/2018   Presence of cardiac pacemaker 01/04/2018   Symptomatic bradycardia  12/08/2017   Abnormal EKG 11/02/2017   Bradycardia by electrocardiogram 11/02/2017   Shortness of breath 11/02/2017   Essential hypertension 10/22/2015   Hyperlipidemia, mixed 10/22/2015   Benign prostatic hyperplasia 10/22/2015   Arthritis, senescent 12/05/2013   Benign essential HTN 02/14/2013   Increased frequency of urination 05/22/2012   Nocturia 05/22/2012   Personal history of prostate cancer 05/22/2012    No Known Allergies  Past Surgical History:  Procedure Laterality Date   EYE SURGERY     cataract extraction   HERNIA REPAIR Left 2014   inguinal hernia   PACEMAKER INSERTION Left 12/27/2017   Procedure: INSERTION PACEMAKER-INITIAL DUAL CHAMBER;  Surgeon: Percival Brace, MD;  Location: [facility] ORS;  Service: Cardiovascular;  Laterality: Left;    Social History   Tobacco Use   Smoking status: Never   Smokeless tobacco: Never  Vaping Use   Vaping status: Never Used  Substance Use Topics   Alcohol use: No   Drug use: No     Medication list has been reviewed and updated.  Current Meds  Medication Sig   acetaminophen  (TYLENOL ) 325 MG tablet Take 650 mg by mouth every 6 (six) hours as needed.   amLODipine  (NORVASC ) 5 MG tablet TAKE 1 TABLET TWICE DAILY   brimonidine (ALPHAGAN) 0.2 % ophthalmic solution Place 1 drop into both eyes 3 (three) times daily.   diclofenac  Sodium (VOLTAREN ) 1 % GEL Apply 2 g topically 4 (four) times daily as needed. To affected joint.   fluticasone  (FLONASE ) 50 MCG/ACT nasal spray USE 1 SPRAY IN EACH NOSTRIL EVERY DAY (NEED MD APPOINTMENT)   latanoprost (XALATAN) 0.005 % ophthalmic solution Place 1 drop into both eyes at bedtime.   losartan  (COZAAR ) 100 MG tablet Take 1 tablet (100 mg total) by mouth daily.   metoprolol succinate (TOPROL-XL) 50 MG 24 hr tablet Take 50 mg by mouth daily.   omeprazole  (PRILOSEC) 20 MG capsule Take 1 capsule (20 mg total) by mouth daily.   simvastatin  (ZOCOR ) 40 MG tablet Take 1 tablet (40 mg total) by  mouth at bedtime.   tamsulosin  (FLOMAX ) 0.4 MG CAPS capsule Take 1 capsule (0.4 mg total) by mouth daily.   timolol (TIMOPTIC) 0.25 % ophthalmic solution Place 1 drop into both eyes 2 (two) times daily.       10/13/2023    1:24 PM 09/19/2023    3:47 PM 06/16/2023    1:36 PM 03/22/2023    1:24 PM  GAD 7 : Generalized Anxiety Score  Nervous, Anxious, on Edge 0 0 0 0  Control/stop worrying 0 1 0 0  Worry too much - different things 0 0 0 0  Trouble relaxing 0 1 0 0  Restless 0 0 0 0  Easily annoyed or irritable 0 0 0 0  Afraid - awful might happen 0 0 0 0  Total GAD 7 Score 0 2 0 0  Anxiety Difficulty Not difficult at all Somewhat difficult Not difficult at all Not difficult at all       10/13/2023    1:24 PM 09/19/2023    3:47 PM 06/16/2023    1:36 PM  Depression screen PHQ 2/9  Decreased Interest 0 1 0  Down, Depressed, Hopeless 0 1 0  PHQ - 2 Score 0 2 0  Altered sleeping 0 1  Tired, decreased energy 0 1   Change in appetite 0 0   Feeling bad or failure about yourself  0 0   Trouble concentrating 0 0   Moving slowly or fidgety/restless 0 0   Suicidal thoughts 0 0   PHQ-9 Score 0 4   Difficult doing work/chores Not difficult at all Somewhat difficult     BP Readings from Last 3 Encounters:  10/13/23 124/60  09/19/23 130/62  06/16/23 (!) 128/58    Physical Exam Vitals and nursing note reviewed.  Constitutional:      Appearance: He is well-developed.  HENT:     Head: Normocephalic and atraumatic.     Right Ear: Tympanic membrane, ear canal and external ear normal.     Left Ear: Tympanic membrane, ear canal and external ear normal.     Nose: Nose normal.     Mouth/Throat:     Dentition: Normal dentition.  Eyes:     General: Lids are normal. No scleral icterus.    Conjunctiva/sclera: Conjunctivae normal.     Pupils: Pupils are equal, round, and reactive to light.  Neck:     Thyroid: No thyromegaly.     Vascular: No carotid bruit, hepatojugular reflux or JVD.      Trachea: No tracheal deviation.  Cardiovascular:     Rate and Rhythm: Normal rate and regular rhythm.     Heart sounds: Normal heart sounds.  Pulmonary:     Effort: Pulmonary effort is normal.     Breath sounds: Normal breath sounds. No wheezing, rhonchi or rales.  Abdominal:     General: Bowel sounds are normal.     Palpations: Abdomen is soft. There is no hepatomegaly, splenomegaly or mass.     Tenderness: There is no abdominal tenderness.     Hernia: There is no hernia in the left inguinal area.  Musculoskeletal:        General: Normal range of motion.     Cervical back: Normal range of motion and neck supple.  Lymphadenopathy:     Cervical: No cervical adenopathy.  Skin:    General: Skin is warm and dry.     Findings: No rash.  Neurological:     Mental Status: He is alert and oriented to person, place, and time.     Sensory: No sensory deficit.     Deep Tendon Reflexes: Reflexes are normal and symmetric.  Psychiatric:        Mood and Affect: Mood is not anxious or depressed.     Wt Readings from Last 3 Encounters:  10/13/23 141 lb 2 oz (64 kg)  03/22/23 140 lb (63.5 kg)  02/06/23 141 lb 15.6 oz (64.4 kg)    BP 124/60   Pulse 73   Ht 6' (1.829 m)   Wt 141 lb 2 oz (64 kg)   SpO2 98%   BMI 19.14 kg/m   Assessment and Plan:  1. Essential hypertension (Primary) Chronic.  Controlled.  Stable.  Asymptomatic.  Tolerating medications well.  Blood pressure today is 124/60 patient is not having any dizziness or symptomatology currently will continue amlodipine  5 mg once a day.  Because patient's blood pressure was a diastolic of 60 going to back down on his amlodipine  to once a day instead of twice a day and continue losartan  at 100 mg once a day.  And we will check renal function panel for electrolytes and GFR to assure that patient has not started going to dehydration.  And will recheck in  6 months per Dr. Harriet Limber - amLODipine  (NORVASC ) 5 MG tablet; Take 1 tablet (5 mg  total) by mouth  daily.  Dispense: 90 tablet; Refill: 1 - losartan  (COZAAR ) 100 MG tablet; Take 1 tablet (100 mg total) by mouth daily.  Dispense: 90 tablet; Refill: 1 - Renal Function Panel  2. Gastroesophageal reflux disease Chronic.  Controlled.  Stable.  Asymptomatic.  Tolerating medications well.  Will continue omeprazole  20 mg once a day.  And will recheck in 6 months - omeprazole  (PRILOSEC) 20 MG capsule; Take 1 capsule (20 mg total) by mouth daily.  Dispense: 90 capsule; Refill: 1  3. Hyperlipidemia, unspecified hyperlipidemia type Chronic.  Controlled.  Stable.  Currently is on simvastatin  40 mg at bedtime patient continues to tolerate it fine we will continue for now but this may be something that we consider lowering or perhaps stopping in the future.  Will check lipid panel for current level of control.  And will recheck in 6 months - simvastatin  (ZOCOR ) 40 MG tablet; Take 1 tablet (40 mg total) by mouth at bedtime.  Dispense: 90 tablet; Refill: 1 - Lipid Panel With LDL/HDL Ratio  4. Benign prostatic hyperplasia with lower urinary tract symptoms, symptom details unspecified Chronic.  Controlled.  Stable.  Patient is asymptomatic with out frequency urgency and nocturia.  Continue tamsulosin  0.4 mg daily and will recheck in 6 months - tamsulosin  (FLOMAX ) 0.4 MG CAPS capsule; Take 1 capsule (0.4 mg total) by mouth daily.  Dispense: 90 capsule; Refill: 1    Alayne Allis, MD

## 2023-10-14 ENCOUNTER — Ambulatory Visit: Payer: Medicare HMO | Admitting: Family Medicine

## 2023-10-14 ENCOUNTER — Ambulatory Visit: Payer: Self-pay | Admitting: Family Medicine

## 2023-10-14 LAB — RENAL FUNCTION PANEL
Albumin: 3.8 g/dL (ref 3.6–4.6)
BUN/Creatinine Ratio: 21 (ref 10–24)
BUN: 27 mg/dL (ref 10–36)
CO2: 19 mmol/L — ABNORMAL LOW (ref 20–29)
Calcium: 8.7 mg/dL (ref 8.6–10.2)
Chloride: 107 mmol/L — ABNORMAL HIGH (ref 96–106)
Creatinine, Ser: 1.28 mg/dL — ABNORMAL HIGH (ref 0.76–1.27)
Glucose: 131 mg/dL — ABNORMAL HIGH (ref 70–99)
Phosphorus: 3.4 mg/dL (ref 2.8–4.1)
Potassium: 4 mmol/L (ref 3.5–5.2)
Sodium: 144 mmol/L (ref 134–144)
eGFR: 51 mL/min/{1.73_m2} — ABNORMAL LOW (ref >59)

## 2023-10-14 LAB — LIPID PANEL WITH LDL/HDL RATIO
Cholesterol, Total: 185 mg/dL (ref 100–199)
HDL: 78 mg/dL (ref >39)
LDL Chol Calc (NIH): 93 mg/dL (ref 0–99)
LDL/HDL Ratio: 1.2 ratio (ref 0.0–3.6)
Triglycerides: 74 mg/dL (ref 0–149)
VLDL Cholesterol Cal: 14 mg/dL (ref 5–40)

## 2023-10-21 ENCOUNTER — Other Ambulatory Visit: Payer: Self-pay

## 2023-10-21 DIAGNOSIS — I10 Essential (primary) hypertension: Secondary | ICD-10-CM

## 2023-10-21 MED ORDER — AMLODIPINE BESYLATE 5 MG PO TABS: 5.0000 mg | ORAL_TABLET | Freq: Every day | ORAL | 1 refills | Status: DC

## 2023-12-26 DIAGNOSIS — H40153 Residual stage of open-angle glaucoma, bilateral: Secondary | ICD-10-CM | POA: Diagnosis not present

## 2024-01-27 DIAGNOSIS — H35353 Cystoid macular degeneration, bilateral: Secondary | ICD-10-CM | POA: Diagnosis not present

## 2024-01-27 DIAGNOSIS — H26491 Other secondary cataract, right eye: Secondary | ICD-10-CM | POA: Diagnosis not present

## 2024-01-27 DIAGNOSIS — H401132 Primary open-angle glaucoma, bilateral, moderate stage: Secondary | ICD-10-CM | POA: Diagnosis not present

## 2024-02-28 DIAGNOSIS — Z950 Presence of cardiac pacemaker: Secondary | ICD-10-CM | POA: Diagnosis not present

## 2024-02-28 DIAGNOSIS — R001 Bradycardia, unspecified: Secondary | ICD-10-CM | POA: Diagnosis not present

## 2024-03-29 ENCOUNTER — Other Ambulatory Visit: Payer: Self-pay

## 2024-03-29 DIAGNOSIS — I10 Essential (primary) hypertension: Secondary | ICD-10-CM

## 2024-03-29 MED ORDER — AMLODIPINE BESYLATE 5 MG PO TABS
5.0000 mg | ORAL_TABLET | Freq: Every day | ORAL | 0 refills | Status: AC
Start: 2024-03-29 — End: 2024-04-28

## 2024-04-01 ENCOUNTER — Emergency Department

## 2024-04-01 ENCOUNTER — Other Ambulatory Visit: Payer: Self-pay

## 2024-04-01 ENCOUNTER — Emergency Department: Admission: EM | Admit: 2024-04-01 | Discharge: 2024-04-01 | Disposition: A | Discharge status: Home / Self Care

## 2024-04-01 DIAGNOSIS — R42 Dizziness and giddiness: Secondary | ICD-10-CM | POA: Insufficient documentation

## 2024-04-01 DIAGNOSIS — N3000 Acute cystitis without hematuria: Secondary | ICD-10-CM | POA: Diagnosis not present

## 2024-04-01 DIAGNOSIS — I509 Heart failure, unspecified: Secondary | ICD-10-CM | POA: Diagnosis not present

## 2024-04-01 DIAGNOSIS — I110 Hypertensive heart disease with heart failure: Secondary | ICD-10-CM | POA: Insufficient documentation

## 2024-04-01 LAB — URINALYSIS, ROUTINE W REFLEX MICROSCOPIC
Bilirubin Urine: NEGATIVE
Glucose, UA: NEGATIVE mg/dL
Ketones, ur: NEGATIVE mg/dL
Nitrite: NEGATIVE
Protein, ur: NEGATIVE mg/dL
Specific Gravity, Urine: 1.015 (ref 1.005–1.030)
pH: 6 (ref 5.0–8.0)

## 2024-04-01 LAB — CBC
HCT: 34.6 % — ABNORMAL LOW (ref 39.0–52.0)
Hemoglobin: 11 g/dL — ABNORMAL LOW (ref 13.0–17.0)
MCH: 28.8 pg (ref 26.0–34.0)
MCHC: 31.8 g/dL (ref 30.0–36.0)
MCV: 90.6 fL (ref 80.0–100.0)
Platelets: 236 K/uL (ref 150–400)
RBC: 3.82 MIL/uL — ABNORMAL LOW (ref 4.22–5.81)
RDW: 13.5 % (ref 11.5–15.5)
WBC: 4.3 K/uL (ref 4.0–10.5)
nRBC: 0 % (ref 0.0–0.2)

## 2024-04-01 LAB — COMPREHENSIVE METABOLIC PANEL WITH GFR
ALT: 12 U/L (ref 0–44)
AST: 19 U/L (ref 15–41)
Albumin: 3.7 g/dL (ref 3.5–5.0)
Alkaline Phosphatase: 41 U/L (ref 38–126)
Anion gap: 5 (ref 5–15)
BUN: 30 mg/dL — ABNORMAL HIGH (ref 8–23)
CO2: 24 mmol/L (ref 22–32)
Calcium: 8.6 mg/dL — ABNORMAL LOW (ref 8.9–10.3)
Chloride: 107 mmol/L (ref 98–111)
Creatinine, Ser: 1.09 mg/dL (ref 0.61–1.24)
GFR, Estimated: >60 mL/min (ref >60)
Glucose, Bld: 146 mg/dL — ABNORMAL HIGH (ref 70–99)
Potassium: 4 mmol/L (ref 3.5–5.1)
Sodium: 136 mmol/L (ref 135–145)
Total Bilirubin: 0.8 mg/dL (ref 0.0–1.2)
Total Protein: 7.3 g/dL (ref 6.5–8.1)

## 2024-04-01 LAB — TROPONIN I (HIGH SENSITIVITY): Troponin I (High Sensitivity): 11 ng/L (ref <18)

## 2024-04-01 MED ORDER — CEPHALEXIN 500 MG PO CAPS
500.0000 mg | ORAL_CAPSULE | Freq: Two times a day (BID) | ORAL | 0 refills | Status: AC
Start: 2024-04-02 — End: 2024-04-11

## 2024-04-01 MED ORDER — ONDANSETRON HCL 4 MG/2ML IJ SOLN
4.0000 mg | Freq: Once | INTRAMUSCULAR | Status: AC
  Administered 2024-04-01: 4 mg via INTRAVENOUS
  Filled 2024-04-01: qty 2

## 2024-04-01 MED ORDER — SODIUM CHLORIDE 0.9 % IV SOLN
1.0000 g | Freq: Once | INTRAVENOUS | Status: AC
  Administered 2024-04-01: 1 g via INTRAVENOUS
  Filled 2024-04-01: qty 10

## 2024-04-01 MED ORDER — MECLIZINE HCL 12.5 MG PO TABS: 12.5000 mg | ORAL_TABLET | Freq: Three times a day (TID) | ORAL | 0 refills | Status: AC | PRN

## 2024-04-01 MED ORDER — MECLIZINE HCL 25 MG PO TABS
25.0000 mg | ORAL_TABLET | Freq: Once | ORAL | Status: AC
  Administered 2024-04-01: 25 mg via ORAL
  Filled 2024-04-01: qty 1

## 2024-04-01 MED ORDER — SODIUM CHLORIDE 0.9 % IV BOLUS
500.0000 mL | Freq: Once | INTRAVENOUS | Status: AC
  Administered 2024-04-01: 500 mL via INTRAVENOUS

## 2024-04-01 NOTE — ED Notes (Signed)
 Pt given a cup of water 

## 2024-04-01 NOTE — ED Provider Notes (Signed)
 [facility] Provider Note    Event Date/Time   First MD Initiated Contact with Patient 04/01/24 1123     (approximate)   History   Dizziness and Altered Mental Status  Pt comes with dizziness since about 430am. Pt states this started few days ago but worse today. Pt states feels room is spinning.   Family reports confusion last few days he has been off and having tremors.   Pt is currently A*OX4. Pt denies any weakness, slurred speech or numbness. Pt family reports pt has been more unsteady then normal.    HPI Eric [name] is a 89 y.o. male PMH hypertension, hyperlipidemia, BPH, vertigo, CHF presents for evaluation of vertigo/dizziness - Had been feeling swimmy headed this morning.  Has resolved by time of my eval. no focal weakness. -Lives alone, ambulates with cane, remains able to ambulate without assistance open family wonders if he is slightly more unsteady than usual -States he had a room spinning sensation that lasted about an hour which is atypical for him -No chest pain, headache, focal weakness, palpitations, recent infectious symptoms -Family states he does not drink much water at baseline -At baseline mental state per family at bedside     Physical Exam   Triage Vital Signs: ED Triage Vitals [04/01/24 1057]  Encounter Vitals Group     BP (!) 146/66     Girls Systolic BP Percentile      Girls Diastolic BP Percentile      Boys Systolic BP Percentile      Boys Diastolic BP Percentile      Pulse Rate 72     Resp 18     Temp 98.5 F (36.9 C)     Temp src      SpO2 99 %     Weight 141 lb (64 kg)     Height 5' 9 (1.753 m)     Head Circumference      Peak Flow      Pain Score 0     Pain Loc      Pain Education      Exclude from Growth Chart     Most recent vital signs: Vitals:   04/01/24 1430 04/01/24 1444  BP: (!) 187/98   Pulse: 93   Resp: 18   Temp:  97.9 F (36.6 C)  SpO2: 100%      General: Awake, no  distress.  HEENT: Normocephalic, atraumatic, TMs partially obscured by cerumen bilaterally but grossly unremarkable CV:  Good peripheral perfusion. RRR, RP 2+ Resp:  Normal effort. CTAB Abd:  No distention. Nontender to deep palpation throughout Neuro:  Aox4, CN II-XII intact, FNF wnl, finger taps fast b/l, 5/5 strength in bilateral finger extension/grip, arm flexion/extension, EHL/FHL. BUE AG 10+ sec no drift, BLE AG 5+ sec no drift. Ambulates with steady gait. SILT.    ED Results / Procedures / Treatments   Labs (all labs ordered are listed, but only abnormal results are displayed) Labs Reviewed  COMPREHENSIVE METABOLIC PANEL WITH GFR - Abnormal; Notable for the following components:      Result Value   Glucose, Bld 146 (*)    BUN 30 (*)    Calcium  8.6 (*)    All other components within normal limits  CBC - Abnormal; Notable for the following components:   RBC 3.82 (*)    Hemoglobin 11.0 (*)    HCT 34.6 (*)    All other components within normal limits  URINALYSIS, ROUTINE  W REFLEX MICROSCOPIC - Abnormal; Notable for the following components:   Color, Urine YELLOW (*)    APPearance HAZY (*)    Hgb urine dipstick MODERATE (*)    Leukocytes,Ua TRACE (*)    Bacteria, UA FEW (*)    All other components within normal limits  CBG MONITORING, ED  TROPONIN I (HIGH SENSITIVITY)  TROPONIN I (HIGH SENSITIVITY)     EKG  See ED course below   RADIOLOGY Radiology interpreted by myself radiology report reviewed.  No acute pathology identified.    PROCEDURES:  Critical Care performed: No  Procedures   MEDICATIONS ORDERED IN ED: Medications  sodium chloride  0.9 % bolus 500 mL (0 mLs Intravenous Stopped 04/01/24 1444)  ondansetron  (ZOFRAN ) injection 4 mg (4 mg Intravenous Given 04/01/24 1242)  meclizine  (ANTIVERT ) tablet 25 mg (25 mg Oral Given 04/01/24 1236)  cefTRIAXone  (ROCEPHIN ) 1 g in sodium chloride  0.9 % 100 mL IVPB (0 g Intravenous Stopped 04/01/24 1525)      IMPRESSION / MDM / ASSESSMENT AND PLAN / ED COURSE  I reviewed the triage vital signs and the nursing notes.                              DDX/MDM/AP: Differential diagnosis includes, but is not limited to, benign vertigo--consider Mnire's, labyrinthitis, BPPV.  Consider underlying electrolyte abnormality, UTI.  Doubt ACS.  Consider transient arrhythmia.  No specific findings to suggest CVA at this time though consider given age.  Plan: - Labs - EKG - Small bolus IV fluid, meclizine , Zofran  - CT head - Reassess  Patient's presentation is most consistent with acute presentation with potential threat to life or bodily function.  The patient is on the cardiac monitor to evaluate for evidence of arrhythmia and/or significant heart rate changes.  ED course below.  Workup overall reassuring no evidence of UTI noted--treated with ceftriaxone .  Feeling much better after IV fluid, meclizine , Zofran .  Suspect UTI causing some element of dizziness, no evidence of other pathology at this time.  Discharged with 9 further days of Keflex  as well as meclizine .  Counseled on oral hydration.  Plan for PMD follow-up.  ED return precautions in place.  Patient and family agree with plan.  Clinical Course as of 04/01/24 1534  Sun Apr 01, 2024  1203 CTH: IMPRESSION: 1. No acute intracranial abnormality. 2. Stable non-contrast CT appearance of chronic small vessel disease.   [MM]  1204 CBC with no leukocytosis, mild stable anemia   [MM]  1204 CMP reviewed, overall at baseline [MM]  1204 Ecg = atrial paced rhythm, rate 62, no gross Eric elevation, trace depressions noted in inferior leads as well as V5, V6.  Normal axis. [MM]  1347 Urinalysis consistent with infection, will treat [MM]    Clinical Course User Index [MM] Clarine Ozell LABOR, MD     FINAL CLINICAL IMPRESSION(S) / ED DIAGNOSES   Final diagnoses:  Dizziness  Acute cystitis without hematuria     Rx / DC Orders   ED Discharge  Orders          Ordered    cephALEXin  (KEFLEX ) 500 MG capsule  2 times daily        04/01/24 1446    meclizine  (ANTIVERT ) 12.5 MG tablet  3 times daily PRN        04/01/24 1446             Note:  This document was prepared  using Conservation officer, historic buildings and may include unintentional dictation errors.   Clarine Ozell LABOR, MD 04/01/24 1534

## 2024-04-01 NOTE — ED Triage Notes (Addendum)
 Pt comes with dizziness since about 430am. Pt states this started few days ago but worse today. Pt states feels room is spinning.   Family reports confusion last few days he has been off and having tremors.   Pt is currently A*OX4. Pt denies any weakness, slurred speech or numbness. Pt family reports pt has been more unsteady then normal.

## 2024-04-01 NOTE — Discharge Instructions (Signed)
 Your evaluation in the emergency department was notable for evidence of a urinary tract infection, I suspect this likely contributed to your symptoms.  Also encourage you to drink plenty fluids daily to prevent dehydration.  I prescribed you a course of antibiotics to treat this infection as well as a dizziness medication to use as needed.  Please follow-up with your primary care provider for reevaluation, and return to the emergency department with any new or worsening symptoms.

## 2024-04-10 ENCOUNTER — Encounter: Admitting: Family Medicine

## 2024-04-12 DIAGNOSIS — I129 Hypertensive chronic kidney disease with stage 1 through stage 4 chronic kidney disease, or unspecified chronic kidney disease: Secondary | ICD-10-CM | POA: Diagnosis not present

## 2024-04-12 DIAGNOSIS — Z1331 Encounter for screening for depression: Secondary | ICD-10-CM | POA: Diagnosis not present

## 2024-04-12 DIAGNOSIS — N189 Chronic kidney disease, unspecified: Secondary | ICD-10-CM | POA: Diagnosis not present

## 2024-04-12 DIAGNOSIS — E782 Mixed hyperlipidemia: Secondary | ICD-10-CM | POA: Diagnosis not present

## 2024-04-12 DIAGNOSIS — Z950 Presence of cardiac pacemaker: Secondary | ICD-10-CM | POA: Diagnosis not present

## 2024-04-21 ENCOUNTER — Other Ambulatory Visit: Payer: Self-pay | Admitting: Family Medicine

## 2024-04-21 DIAGNOSIS — I10 Essential (primary) hypertension: Secondary | ICD-10-CM

## 2024-04-25 NOTE — Telephone Encounter (Signed)
 Requested medication (s) are due for refill today: yes  Requested medication (s) are on the active medication list: yes  Last refill:  03/29/24  Future visit scheduled: no  Notes to clinic:  Unable to refill per protocol, courtesy refill already given, routing for provider approval.      Requested Prescriptions  Pending Prescriptions Disp Refills   amLODipine  (NORVASC ) 5 MG tablet [Pharmacy Med Name: AMLODIPINE  BESYLATE 5 MG Oral Tablet] 30 tablet 11    Sig: TAKE 1 TABLET EVERY DAY     Cardiovascular: Calcium  Channel Blockers 2 Failed - 04/25/2024  9:25 AM      Failed - Last BP in normal range    BP Readings from Last 1 Encounters:  04/01/24 (!) 187/98         Failed - Valid encounter within last 6 months    Recent Outpatient Visits           6 months ago Essential hypertension   [facility] Cathryne JAYSON, MD   7 months ago Primary osteoarthritis of right knee   [facility], Selinda PARAS, MD              Passed - Last Heart Rate in normal range    Pulse Readings from Last 1 Encounters:  04/01/24 93
